# Patient Record
Sex: Female | Born: 1935 | Race: White | Hispanic: No | Smoking: Former smoker
Health system: Southern US, Community
[De-identification: ages and names within clinical notes are randomized; demographics above are authoritative.]

## PROBLEM LIST (undated history)

## (undated) DIAGNOSIS — I10 Essential (primary) hypertension: Secondary | ICD-10-CM

## (undated) DIAGNOSIS — K559 Vascular disorder of intestine, unspecified: Secondary | ICD-10-CM

## (undated) DIAGNOSIS — F419 Anxiety disorder, unspecified: Secondary | ICD-10-CM

## (undated) DIAGNOSIS — Z9889 Other specified postprocedural states: Secondary | ICD-10-CM

## (undated) DIAGNOSIS — F329 Major depressive disorder, single episode, unspecified: Secondary | ICD-10-CM

## (undated) DIAGNOSIS — I729 Aneurysm of unspecified site: Secondary | ICD-10-CM

## (undated) DIAGNOSIS — E7800 Pure hypercholesterolemia, unspecified: Secondary | ICD-10-CM

## (undated) DIAGNOSIS — I2510 Atherosclerotic heart disease of native coronary artery without angina pectoris: Secondary | ICD-10-CM

## (undated) DIAGNOSIS — C50919 Malignant neoplasm of unspecified site of unspecified female breast: Secondary | ICD-10-CM

## (undated) DIAGNOSIS — F32A Depression, unspecified: Secondary | ICD-10-CM

## (undated) DIAGNOSIS — R112 Nausea with vomiting, unspecified: Secondary | ICD-10-CM

## (undated) DIAGNOSIS — K838 Other specified diseases of biliary tract: Secondary | ICD-10-CM

## (undated) DIAGNOSIS — J449 Chronic obstructive pulmonary disease, unspecified: Secondary | ICD-10-CM

## (undated) HISTORY — PX: ABDOMINAL HYSTERECTOMY: SHX81

## (undated) HISTORY — PX: CHOLECYSTECTOMY: SHX55

## (undated) HISTORY — PX: CORONARY ANGIOPLASTY WITH STENT PLACEMENT: SHX49

## (undated) HISTORY — PX: BREAST SURGERY: SHX581

## (undated) HISTORY — PX: HERNIA REPAIR: SHX51

## (undated) HISTORY — PX: APPENDECTOMY: SHX54

---

## 1997-10-06 ENCOUNTER — Inpatient Hospital Stay (HOSPITAL_COMMUNITY): Admission: EM | Admit: 1997-10-06 | Discharge: 1997-10-10 | Payer: Self-pay | Admitting: Cardiovascular Disease

## 1997-10-09 ENCOUNTER — Encounter: Payer: Self-pay | Admitting: Cardiovascular Disease

## 1998-02-14 ENCOUNTER — Inpatient Hospital Stay (HOSPITAL_COMMUNITY): Admission: AD | Admit: 1998-02-14 | Discharge: 1998-02-16 | Payer: Self-pay | Admitting: Cardiovascular Disease

## 1999-09-02 ENCOUNTER — Inpatient Hospital Stay (HOSPITAL_COMMUNITY): Admission: AD | Admit: 1999-09-02 | Discharge: 1999-09-04 | Payer: Self-pay | Admitting: Cardiology

## 2000-06-02 ENCOUNTER — Ambulatory Visit (HOSPITAL_COMMUNITY): Admission: RE | Admit: 2000-06-02 | Discharge: 2000-06-02 | Payer: Self-pay | Admitting: Family Medicine

## 2000-06-02 ENCOUNTER — Encounter: Payer: Self-pay | Admitting: Family Medicine

## 2001-01-12 DIAGNOSIS — C50919 Malignant neoplasm of unspecified site of unspecified female breast: Secondary | ICD-10-CM

## 2001-01-12 HISTORY — DX: Malignant neoplasm of unspecified site of unspecified female breast: C50.919

## 2001-04-05 ENCOUNTER — Ambulatory Visit (HOSPITAL_COMMUNITY): Admission: RE | Admit: 2001-04-05 | Discharge: 2001-04-05 | Payer: Self-pay | Admitting: Family Medicine

## 2001-04-05 ENCOUNTER — Encounter: Payer: Self-pay | Admitting: Family Medicine

## 2001-04-06 ENCOUNTER — Encounter: Payer: Self-pay | Admitting: Family Medicine

## 2001-04-06 ENCOUNTER — Ambulatory Visit (HOSPITAL_COMMUNITY): Admission: RE | Admit: 2001-04-06 | Discharge: 2001-04-06 | Payer: Self-pay | Admitting: Family Medicine

## 2001-04-13 ENCOUNTER — Encounter: Payer: Self-pay | Admitting: General Surgery

## 2001-04-13 ENCOUNTER — Ambulatory Visit (HOSPITAL_COMMUNITY): Admission: RE | Admit: 2001-04-13 | Discharge: 2001-04-13 | Payer: Self-pay | Admitting: General Surgery

## 2001-04-20 ENCOUNTER — Ambulatory Visit (HOSPITAL_COMMUNITY): Admission: RE | Admit: 2001-04-20 | Discharge: 2001-04-20 | Payer: Self-pay | Admitting: General Surgery

## 2001-04-20 ENCOUNTER — Encounter: Payer: Self-pay | Admitting: General Surgery

## 2001-04-25 ENCOUNTER — Encounter: Admission: RE | Admit: 2001-04-25 | Discharge: 2001-04-25 | Payer: Self-pay | Admitting: Oncology

## 2001-04-25 ENCOUNTER — Encounter (HOSPITAL_COMMUNITY): Admission: RE | Admit: 2001-04-25 | Discharge: 2001-05-25 | Payer: Self-pay | Admitting: Oncology

## 2001-04-28 ENCOUNTER — Encounter (HOSPITAL_COMMUNITY): Payer: Self-pay | Admitting: Oncology

## 2001-06-24 ENCOUNTER — Encounter (HOSPITAL_COMMUNITY): Admission: RE | Admit: 2001-06-24 | Discharge: 2001-07-24 | Payer: Self-pay | Admitting: Oncology

## 2001-06-24 ENCOUNTER — Encounter: Admission: RE | Admit: 2001-06-24 | Discharge: 2001-06-24 | Payer: Self-pay | Admitting: Oncology

## 2001-09-19 ENCOUNTER — Other Ambulatory Visit: Admission: RE | Admit: 2001-09-19 | Discharge: 2001-09-19 | Payer: Self-pay | Admitting: Internal Medicine

## 2001-09-19 ENCOUNTER — Encounter (HOSPITAL_COMMUNITY): Admission: RE | Admit: 2001-09-19 | Discharge: 2001-10-19 | Payer: Self-pay | Admitting: Oncology

## 2001-09-19 ENCOUNTER — Encounter: Admission: RE | Admit: 2001-09-19 | Discharge: 2001-09-19 | Payer: Self-pay | Admitting: Oncology

## 2001-11-07 ENCOUNTER — Ambulatory Visit (HOSPITAL_COMMUNITY): Admission: RE | Admit: 2001-11-07 | Discharge: 2001-11-07 | Payer: Self-pay | Admitting: Family Medicine

## 2001-11-07 ENCOUNTER — Encounter: Payer: Self-pay | Admitting: Family Medicine

## 2001-12-28 ENCOUNTER — Encounter: Admission: RE | Admit: 2001-12-28 | Discharge: 2001-12-28 | Payer: Self-pay | Admitting: Oncology

## 2001-12-28 ENCOUNTER — Encounter (HOSPITAL_COMMUNITY): Admission: RE | Admit: 2001-12-28 | Discharge: 2002-01-27 | Payer: Self-pay | Admitting: Oncology

## 2002-01-15 ENCOUNTER — Emergency Department (HOSPITAL_COMMUNITY): Admission: EM | Admit: 2002-01-15 | Discharge: 2002-01-15 | Payer: Self-pay | Admitting: Emergency Medicine

## 2002-03-09 ENCOUNTER — Emergency Department (HOSPITAL_COMMUNITY): Admission: EM | Admit: 2002-03-09 | Discharge: 2002-03-09 | Payer: Self-pay | Admitting: Emergency Medicine

## 2002-03-11 ENCOUNTER — Encounter: Payer: Self-pay | Admitting: Internal Medicine

## 2002-03-11 ENCOUNTER — Inpatient Hospital Stay (HOSPITAL_COMMUNITY): Admission: EM | Admit: 2002-03-11 | Discharge: 2002-03-15 | Payer: Self-pay | Admitting: Internal Medicine

## 2002-04-11 ENCOUNTER — Encounter (HOSPITAL_COMMUNITY): Admission: RE | Admit: 2002-04-11 | Discharge: 2002-05-11 | Payer: Self-pay | Admitting: Oncology

## 2002-04-11 ENCOUNTER — Encounter: Admission: RE | Admit: 2002-04-11 | Discharge: 2002-04-11 | Payer: Self-pay | Admitting: Oncology

## 2002-04-11 ENCOUNTER — Encounter (HOSPITAL_COMMUNITY): Payer: Self-pay | Admitting: Oncology

## 2002-05-11 ENCOUNTER — Encounter (HOSPITAL_COMMUNITY): Admission: RE | Admit: 2002-05-11 | Discharge: 2002-06-10 | Payer: Self-pay | Admitting: Oncology

## 2002-05-11 ENCOUNTER — Encounter: Admission: RE | Admit: 2002-05-11 | Discharge: 2002-05-11 | Payer: Self-pay

## 2002-06-04 ENCOUNTER — Inpatient Hospital Stay (HOSPITAL_COMMUNITY): Admission: EM | Admit: 2002-06-04 | Discharge: 2002-06-05 | Payer: Self-pay | Admitting: Emergency Medicine

## 2002-06-04 ENCOUNTER — Encounter: Payer: Self-pay | Admitting: Emergency Medicine

## 2002-06-12 ENCOUNTER — Encounter: Admission: RE | Admit: 2002-06-12 | Discharge: 2002-06-12 | Payer: Self-pay | Admitting: Oncology

## 2002-08-22 ENCOUNTER — Ambulatory Visit (HOSPITAL_COMMUNITY): Admission: RE | Admit: 2002-08-22 | Discharge: 2002-08-22 | Payer: Self-pay | Admitting: Family Medicine

## 2002-08-22 ENCOUNTER — Encounter: Payer: Self-pay | Admitting: Family Medicine

## 2002-08-28 ENCOUNTER — Encounter: Admission: RE | Admit: 2002-08-28 | Discharge: 2002-08-28 | Payer: Self-pay | Admitting: Oncology

## 2002-08-28 ENCOUNTER — Encounter (HOSPITAL_COMMUNITY): Admission: RE | Admit: 2002-08-28 | Discharge: 2002-09-27 | Payer: Self-pay | Admitting: Oncology

## 2002-10-18 ENCOUNTER — Encounter: Admission: RE | Admit: 2002-10-18 | Discharge: 2002-10-18 | Payer: Self-pay | Admitting: Oncology

## 2002-10-18 ENCOUNTER — Encounter (HOSPITAL_COMMUNITY): Payer: Self-pay | Admitting: Oncology

## 2002-10-18 ENCOUNTER — Encounter (HOSPITAL_COMMUNITY): Admission: RE | Admit: 2002-10-18 | Discharge: 2002-11-17 | Payer: Self-pay | Admitting: Oncology

## 2002-12-04 ENCOUNTER — Encounter (HOSPITAL_COMMUNITY): Admission: RE | Admit: 2002-12-04 | Discharge: 2003-01-03 | Payer: Self-pay | Admitting: Oncology

## 2002-12-04 ENCOUNTER — Encounter: Admission: RE | Admit: 2002-12-04 | Discharge: 2002-12-04 | Payer: Self-pay | Admitting: Oncology

## 2002-12-19 ENCOUNTER — Ambulatory Visit (HOSPITAL_COMMUNITY): Admission: RE | Admit: 2002-12-19 | Discharge: 2002-12-19 | Payer: Self-pay | Admitting: Internal Medicine

## 2003-01-18 ENCOUNTER — Encounter: Admission: RE | Admit: 2003-01-18 | Discharge: 2003-01-18 | Payer: Self-pay | Admitting: Oncology

## 2003-02-03 ENCOUNTER — Emergency Department (HOSPITAL_COMMUNITY): Admission: EM | Admit: 2003-02-03 | Discharge: 2003-02-03 | Payer: Self-pay | Admitting: Emergency Medicine

## 2003-02-26 ENCOUNTER — Ambulatory Visit (HOSPITAL_COMMUNITY): Admission: RE | Admit: 2003-02-26 | Discharge: 2003-02-26 | Payer: Self-pay | Admitting: Family Medicine

## 2003-03-26 ENCOUNTER — Inpatient Hospital Stay (HOSPITAL_COMMUNITY): Admission: RE | Admit: 2003-03-26 | Discharge: 2003-03-28 | Payer: Self-pay | Admitting: Family Medicine

## 2003-04-02 ENCOUNTER — Encounter: Admission: RE | Admit: 2003-04-02 | Discharge: 2003-04-02 | Payer: Self-pay | Admitting: Oncology

## 2003-05-23 ENCOUNTER — Encounter (HOSPITAL_COMMUNITY): Admission: RE | Admit: 2003-05-23 | Discharge: 2003-06-22 | Payer: Self-pay | Admitting: Oncology

## 2003-09-13 ENCOUNTER — Other Ambulatory Visit: Admission: RE | Admit: 2003-09-13 | Discharge: 2003-09-13 | Payer: Self-pay | Admitting: Internal Medicine

## 2003-09-27 ENCOUNTER — Other Ambulatory Visit: Admission: RE | Admit: 2003-09-27 | Discharge: 2003-09-27 | Payer: Self-pay | Admitting: Internal Medicine

## 2003-10-03 ENCOUNTER — Encounter: Admission: RE | Admit: 2003-10-03 | Discharge: 2003-10-12 | Payer: Self-pay | Admitting: Oncology

## 2003-10-03 ENCOUNTER — Encounter (HOSPITAL_COMMUNITY): Admission: RE | Admit: 2003-10-03 | Discharge: 2003-10-12 | Payer: Self-pay | Admitting: Oncology

## 2003-10-15 ENCOUNTER — Encounter: Admission: RE | Admit: 2003-10-15 | Discharge: 2003-10-15 | Payer: Self-pay | Admitting: Oncology

## 2003-12-24 ENCOUNTER — Other Ambulatory Visit: Admission: RE | Admit: 2003-12-24 | Discharge: 2003-12-24 | Payer: Self-pay | Admitting: Dermatology

## 2004-02-21 ENCOUNTER — Encounter: Admission: RE | Admit: 2004-02-21 | Discharge: 2004-02-21 | Payer: Self-pay | Admitting: Oncology

## 2004-02-21 ENCOUNTER — Ambulatory Visit (HOSPITAL_COMMUNITY): Payer: Self-pay | Admitting: Oncology

## 2004-05-21 ENCOUNTER — Encounter (HOSPITAL_COMMUNITY): Admission: RE | Admit: 2004-05-21 | Discharge: 2004-06-20 | Payer: Self-pay | Admitting: Oncology

## 2004-05-21 ENCOUNTER — Encounter: Admission: RE | Admit: 2004-05-21 | Discharge: 2004-05-21 | Payer: Self-pay | Admitting: Oncology

## 2004-06-20 ENCOUNTER — Ambulatory Visit (HOSPITAL_COMMUNITY): Payer: Self-pay | Admitting: Oncology

## 2004-10-07 ENCOUNTER — Ambulatory Visit (HOSPITAL_COMMUNITY): Payer: Self-pay | Admitting: Oncology

## 2004-10-07 ENCOUNTER — Encounter: Admission: RE | Admit: 2004-10-07 | Discharge: 2004-10-10 | Payer: Self-pay | Admitting: Oncology

## 2004-10-24 ENCOUNTER — Encounter: Admission: RE | Admit: 2004-10-24 | Discharge: 2004-10-24 | Payer: Self-pay | Admitting: Oncology

## 2005-03-06 ENCOUNTER — Encounter: Admission: RE | Admit: 2005-03-06 | Discharge: 2005-03-06 | Payer: Self-pay | Admitting: Oncology

## 2005-03-06 ENCOUNTER — Ambulatory Visit (HOSPITAL_COMMUNITY): Payer: Self-pay | Admitting: Oncology

## 2005-05-27 ENCOUNTER — Encounter: Admission: RE | Admit: 2005-05-27 | Discharge: 2005-05-27 | Payer: Self-pay | Admitting: Oncology

## 2005-05-27 ENCOUNTER — Encounter (HOSPITAL_COMMUNITY): Admission: RE | Admit: 2005-05-27 | Discharge: 2005-06-26 | Payer: Self-pay | Admitting: Oncology

## 2005-07-03 ENCOUNTER — Encounter (HOSPITAL_COMMUNITY): Admission: RE | Admit: 2005-07-03 | Discharge: 2005-08-02 | Payer: Self-pay | Admitting: Oncology

## 2005-07-03 ENCOUNTER — Encounter: Admission: RE | Admit: 2005-07-03 | Discharge: 2005-07-03 | Payer: Self-pay | Admitting: Oncology

## 2005-07-06 ENCOUNTER — Ambulatory Visit (HOSPITAL_COMMUNITY): Payer: Self-pay | Admitting: Oncology

## 2005-10-30 ENCOUNTER — Encounter (HOSPITAL_COMMUNITY): Admission: RE | Admit: 2005-10-30 | Discharge: 2005-11-29 | Payer: Self-pay | Admitting: Oncology

## 2005-10-30 ENCOUNTER — Encounter: Admission: RE | Admit: 2005-10-30 | Discharge: 2005-10-30 | Payer: Self-pay | Admitting: Oncology

## 2005-10-30 ENCOUNTER — Ambulatory Visit (HOSPITAL_COMMUNITY): Payer: Self-pay | Admitting: Oncology

## 2006-03-11 ENCOUNTER — Encounter (HOSPITAL_COMMUNITY): Admission: RE | Admit: 2006-03-11 | Discharge: 2006-04-10 | Payer: Self-pay | Admitting: Oncology

## 2006-03-15 ENCOUNTER — Ambulatory Visit (HOSPITAL_COMMUNITY): Payer: Self-pay | Admitting: Oncology

## 2006-06-02 ENCOUNTER — Encounter (HOSPITAL_COMMUNITY): Admission: RE | Admit: 2006-06-02 | Discharge: 2006-07-02 | Payer: Self-pay | Admitting: Oncology

## 2006-07-07 ENCOUNTER — Encounter (HOSPITAL_COMMUNITY): Admission: RE | Admit: 2006-07-07 | Discharge: 2006-08-06 | Payer: Self-pay | Admitting: Oncology

## 2006-07-07 ENCOUNTER — Ambulatory Visit (HOSPITAL_COMMUNITY): Payer: Self-pay | Admitting: Oncology

## 2006-10-26 ENCOUNTER — Ambulatory Visit (HOSPITAL_COMMUNITY): Admission: RE | Admit: 2006-10-26 | Discharge: 2006-10-26 | Payer: Self-pay | Admitting: Ophthalmology

## 2006-11-03 ENCOUNTER — Ambulatory Visit (HOSPITAL_COMMUNITY): Payer: Self-pay | Admitting: Oncology

## 2006-11-09 ENCOUNTER — Ambulatory Visit (HOSPITAL_COMMUNITY): Admission: RE | Admit: 2006-11-09 | Discharge: 2006-11-09 | Payer: Self-pay | Admitting: Ophthalmology

## 2007-01-13 DIAGNOSIS — K559 Vascular disorder of intestine, unspecified: Secondary | ICD-10-CM

## 2007-01-13 HISTORY — DX: Vascular disorder of intestine, unspecified: K55.9

## 2007-03-02 ENCOUNTER — Encounter (HOSPITAL_COMMUNITY): Admission: RE | Admit: 2007-03-02 | Discharge: 2007-04-01 | Payer: Self-pay | Admitting: Oncology

## 2007-03-02 ENCOUNTER — Ambulatory Visit (HOSPITAL_COMMUNITY): Payer: Self-pay | Admitting: Oncology

## 2007-05-13 HISTORY — PX: COLONOSCOPY: SHX174

## 2007-05-13 HISTORY — PX: ESOPHAGOGASTRODUODENOSCOPY: SHX1529

## 2007-06-01 ENCOUNTER — Ambulatory Visit: Payer: Self-pay | Admitting: Gastroenterology

## 2007-06-01 ENCOUNTER — Observation Stay (HOSPITAL_COMMUNITY): Admission: EM | Admit: 2007-06-01 | Discharge: 2007-06-03 | Payer: Self-pay | Admitting: Emergency Medicine

## 2007-06-01 ENCOUNTER — Encounter: Payer: Self-pay | Admitting: Gastroenterology

## 2007-06-13 ENCOUNTER — Ambulatory Visit (HOSPITAL_COMMUNITY): Admission: RE | Admit: 2007-06-13 | Discharge: 2007-06-13 | Payer: Self-pay | Admitting: Pulmonary Disease

## 2007-06-28 ENCOUNTER — Ambulatory Visit (HOSPITAL_COMMUNITY): Payer: Self-pay | Admitting: Oncology

## 2007-06-28 ENCOUNTER — Encounter (HOSPITAL_COMMUNITY): Admission: RE | Admit: 2007-06-28 | Discharge: 2007-07-28 | Payer: Self-pay | Admitting: Oncology

## 2007-10-26 ENCOUNTER — Ambulatory Visit (HOSPITAL_COMMUNITY): Payer: Self-pay | Admitting: Oncology

## 2007-10-26 ENCOUNTER — Encounter (HOSPITAL_COMMUNITY): Admission: RE | Admit: 2007-10-26 | Discharge: 2007-11-25 | Payer: Self-pay | Admitting: Oncology

## 2008-03-14 ENCOUNTER — Ambulatory Visit (HOSPITAL_COMMUNITY): Payer: Self-pay | Admitting: Oncology

## 2008-03-14 ENCOUNTER — Encounter (HOSPITAL_COMMUNITY): Admission: RE | Admit: 2008-03-14 | Discharge: 2008-04-13 | Payer: Self-pay | Admitting: Oncology

## 2008-07-26 ENCOUNTER — Encounter (HOSPITAL_COMMUNITY): Admission: RE | Admit: 2008-07-26 | Discharge: 2008-08-25 | Payer: Self-pay | Admitting: Oncology

## 2008-07-26 ENCOUNTER — Ambulatory Visit (HOSPITAL_COMMUNITY): Payer: Self-pay | Admitting: Oncology

## 2008-07-29 ENCOUNTER — Inpatient Hospital Stay (HOSPITAL_COMMUNITY): Admission: EM | Admit: 2008-07-29 | Discharge: 2008-07-30 | Payer: Self-pay | Admitting: Emergency Medicine

## 2008-08-01 ENCOUNTER — Emergency Department (HOSPITAL_COMMUNITY): Admission: EM | Admit: 2008-08-01 | Discharge: 2008-08-01 | Payer: Self-pay | Admitting: Emergency Medicine

## 2009-04-10 ENCOUNTER — Ambulatory Visit (HOSPITAL_COMMUNITY): Admission: RE | Admit: 2009-04-10 | Discharge: 2009-04-10 | Payer: Self-pay | Admitting: Oncology

## 2009-05-08 ENCOUNTER — Ambulatory Visit (HOSPITAL_COMMUNITY): Payer: Self-pay | Admitting: Oncology

## 2009-10-07 ENCOUNTER — Emergency Department (HOSPITAL_COMMUNITY): Admission: EM | Admit: 2009-10-07 | Discharge: 2009-10-07 | Payer: Self-pay | Admitting: Emergency Medicine

## 2009-11-06 ENCOUNTER — Ambulatory Visit (HOSPITAL_COMMUNITY): Admission: RE | Admit: 2009-11-06 | Discharge: 2009-11-06 | Payer: Self-pay | Admitting: Oncology

## 2010-02-01 ENCOUNTER — Encounter (HOSPITAL_COMMUNITY): Payer: Self-pay | Admitting: Oncology

## 2010-03-27 LAB — CBC
HCT: 42.7 % (ref 36.0–46.0)
Hemoglobin: 14.8 g/dL (ref 12.0–15.0)
MCH: 33.7 pg (ref 26.0–34.0)
MCHC: 34.7 g/dL (ref 30.0–36.0)
RDW: 13.3 % (ref 11.5–15.5)

## 2010-03-27 LAB — DIFFERENTIAL
Basophils Absolute: 0.1 10*3/uL (ref 0.0–0.1)
Basophils Relative: 1 % (ref 0–1)
Eosinophils Absolute: 0.5 10*3/uL (ref 0.0–0.7)
Monocytes Absolute: 0.9 10*3/uL (ref 0.1–1.0)
Monocytes Relative: 10 % (ref 3–12)
Neutro Abs: 5.4 10*3/uL (ref 1.7–7.7)

## 2010-03-27 LAB — BASIC METABOLIC PANEL
BUN: 10 mg/dL (ref 6–23)
CO2: 29 mEq/L (ref 19–32)
Calcium: 9.4 mg/dL (ref 8.4–10.5)
Chloride: 106 mEq/L (ref 96–112)
Creatinine, Ser: 0.65 mg/dL (ref 0.4–1.2)
GFR calc Af Amer: >60 mL/min (ref >60)
GFR calc non Af Amer: >60 mL/min (ref >60)
Glucose, Bld: 129 mg/dL — ABNORMAL HIGH (ref 70–99)
Potassium: 3.9 mEq/L (ref 3.5–5.1)
Sodium: 139 mEq/L (ref 135–145)

## 2010-03-27 LAB — POCT CARDIAC MARKERS: CKMB, poc: <1 ng/mL — ABNORMAL LOW (ref 1.0–8.0)

## 2010-04-20 LAB — URINALYSIS, ROUTINE W REFLEX MICROSCOPIC
Bilirubin Urine: NEGATIVE
Glucose, UA: NEGATIVE mg/dL
Ketones, ur: NEGATIVE mg/dL
Leukocytes, UA: NEGATIVE
Nitrite: NEGATIVE
Protein, ur: NEGATIVE mg/dL
Specific Gravity, Urine: 1.02 (ref 1.005–1.030)
Urobilinogen, UA: 0.2 mg/dL (ref 0.0–1.0)
pH: 6.5 (ref 5.0–8.0)

## 2010-04-20 LAB — BASIC METABOLIC PANEL WITH GFR
GFR calc Af Amer: >60 mL/min (ref >60)
GFR calc non Af Amer: >60 mL/min (ref >60)
Potassium: 3.4 meq/L — ABNORMAL LOW (ref 3.5–5.1)
Sodium: 135 meq/L (ref 135–145)

## 2010-04-20 LAB — BASIC METABOLIC PANEL
BUN: 11 mg/dL (ref 6–23)
CO2: 29 mEq/L (ref 19–32)
Calcium: 9.2 mg/dL (ref 8.4–10.5)
Chloride: 102 mEq/L (ref 96–112)
Creatinine, Ser: 0.67 mg/dL (ref 0.4–1.2)
Glucose, Bld: 121 mg/dL — ABNORMAL HIGH (ref 70–99)

## 2010-04-20 LAB — HEPATIC FUNCTION PANEL
ALT: 26 U/L (ref 0–35)
AST: 45 U/L — ABNORMAL HIGH (ref 0–37)
Albumin: 3.9 g/dL (ref 3.5–5.2)
Alkaline Phosphatase: 65 U/L (ref 39–117)
Bilirubin, Direct: 0.2 mg/dL (ref 0.0–0.3)
Indirect Bilirubin: 0.6 mg/dL (ref 0.3–0.9)
Total Bilirubin: 0.8 mg/dL (ref 0.3–1.2)
Total Protein: 6.6 g/dL (ref 6.0–8.3)

## 2010-04-20 LAB — COMPREHENSIVE METABOLIC PANEL
ALT: 174 U/L — ABNORMAL HIGH (ref 0–35)
ALT: 13 U/L (ref 0–35)
AST: 19 U/L (ref 0–37)
Albumin: 3.1 g/dL — ABNORMAL LOW (ref 3.5–5.2)
Alkaline Phosphatase: 72 U/L (ref 39–117)
BUN: 13 mg/dL (ref 6–23)
CO2: 28 mEq/L (ref 19–32)
Calcium: 8 mg/dL — ABNORMAL LOW (ref 8.4–10.5)
Calcium: 9.2 mg/dL (ref 8.4–10.5)
Creatinine, Ser: 0.57 mg/dL (ref 0.4–1.2)
GFR calc Af Amer: >60 mL/min (ref >60)
GFR calc non Af Amer: >60 mL/min (ref >60)
Glucose, Bld: 108 mg/dL — ABNORMAL HIGH (ref 70–99)
Potassium: 3.8 mEq/L (ref 3.5–5.1)
Sodium: 142 mEq/L (ref 135–145)
Sodium: 137 mEq/L (ref 135–145)
Total Bilirubin: 0.9 mg/dL (ref 0.3–1.2)
Total Bilirubin: 0.9 mg/dL (ref 0.3–1.2)
Total Protein: 5.8 g/dL — ABNORMAL LOW (ref 6.0–8.3)
Total Protein: 6.4 g/dL (ref 6.0–8.3)

## 2010-04-20 LAB — DIFFERENTIAL
Band Neutrophils: 17 % — ABNORMAL HIGH (ref 0–10)
Basophils Absolute: 0 10*3/uL (ref 0.0–0.1)
Basophils Relative: 0 % (ref 0–1)
Basophils Relative: 0 % (ref 0–1)
Basophils Relative: 0 % (ref 0–1)
Blasts: 0 %
Eosinophils Absolute: 0.1 10*3/uL (ref 0.0–0.7)
Eosinophils Absolute: 0.2 10*3/uL (ref 0.0–0.7)
Eosinophils Relative: 0 % (ref 0–5)
Eosinophils Relative: 2 % (ref 0–5)
Lymphocytes Relative: 2 % — ABNORMAL LOW (ref 12–46)
Lymphocytes Relative: 5 % — ABNORMAL LOW (ref 12–46)
Lymphs Abs: 0.4 10*3/uL — ABNORMAL LOW (ref 0.7–4.0)
Lymphs Abs: 0.4 K/uL — ABNORMAL LOW (ref 0.7–4.0)
Monocytes Absolute: 0 K/uL — ABNORMAL LOW (ref 0.1–1.0)
Monocytes Absolute: 0.7 10*3/uL (ref 0.1–1.0)
Monocytes Relative: 0 % — ABNORMAL LOW (ref 3–12)
Monocytes Relative: 3 % (ref 3–12)
Neutro Abs: 8.6 10*3/uL — ABNORMAL HIGH (ref 1.7–7.7)
Neutrophils Relative %: 79 % — ABNORMAL HIGH (ref 43–77)
Neutrophils Relative %: 93 % — ABNORMAL HIGH (ref 43–77)

## 2010-04-20 LAB — CBC
HCT: 38.6 % (ref 36.0–46.0)
HCT: 44.4 % (ref 36.0–46.0)
Hemoglobin: 12.9 g/dL (ref 12.0–15.0)
Hemoglobin: 15.4 g/dL — ABNORMAL HIGH (ref 12.0–15.0)
MCHC: 34.5 g/dL (ref 30.0–36.0)
MCHC: 34.6 g/dL (ref 30.0–36.0)
MCHC: 35 g/dL (ref 30.0–36.0)
MCV: 96.4 fL (ref 78.0–100.0)
MCV: 96.5 fL (ref 78.0–100.0)
Platelets: 106 10*3/uL — ABNORMAL LOW (ref 150–400)
Platelets: 107 10*3/uL — ABNORMAL LOW (ref 150–400)
RBC: 3.83 MIL/uL — ABNORMAL LOW (ref 3.87–5.11)
RBC: 4.6 MIL/uL (ref 3.87–5.11)
RDW: 13.1 % (ref 11.5–15.5)
RDW: 13.4 % (ref 11.5–15.5)
WBC: 9.2 K/uL (ref 4.0–10.5)

## 2010-04-20 LAB — BLOOD GAS, ARTERIAL
Acid-base deficit: 0.7 mmol/L (ref 0.0–2.0)
Bicarbonate: 25.2 mEq/L — ABNORMAL HIGH (ref 20.0–24.0)
O2 Content: 2 L/min
O2 Saturation: 74.2 %
Patient temperature: 98.6
pCO2 arterial: 55.6 mmHg — ABNORMAL HIGH (ref 35.0–45.0)
pH, Arterial: 7.279 — ABNORMAL LOW (ref 7.350–7.400)
pO2, Arterial: 43.5 mmHg — ABNORMAL LOW (ref 80.0–100.0)

## 2010-04-20 LAB — BRAIN NATRIURETIC PEPTIDE: Pro B Natriuretic peptide (BNP): 38.8 pg/mL (ref 0.0–100.0)

## 2010-04-20 LAB — URINE MICROSCOPIC-ADD ON

## 2010-04-20 LAB — CULTURE, BLOOD (ROUTINE X 2)
Culture: NO GROWTH
Report Status: 7232010

## 2010-04-20 LAB — LIPASE, BLOOD: Lipase: 29 U/L (ref 11–59)

## 2010-04-20 LAB — HEMOGLOBIN A1C: Hgb A1c MFr Bld: 5.6 % (ref 4.6–6.1)

## 2010-04-20 LAB — CARDIAC PANEL(CRET KIN+CKTOT+MB+TROPI): Troponin I: 0.06 ng/mL (ref 0.00–0.06)

## 2010-04-24 LAB — COMPREHENSIVE METABOLIC PANEL
Alkaline Phosphatase: 61 U/L (ref 39–117)
BUN: 10 mg/dL (ref 6–23)
CO2: 29 mEq/L (ref 19–32)
GFR calc non Af Amer: >60 mL/min (ref >60)
Glucose, Bld: 108 mg/dL — ABNORMAL HIGH (ref 70–99)
Potassium: 4 mEq/L (ref 3.5–5.1)
Total Protein: 6.2 g/dL (ref 6.0–8.3)

## 2010-04-24 LAB — DIFFERENTIAL
Basophils Absolute: 0 10*3/uL (ref 0.0–0.1)
Basophils Relative: 0 % (ref 0–1)
Monocytes Absolute: 0.6 10*3/uL (ref 0.1–1.0)
Neutro Abs: 4.1 10*3/uL (ref 1.7–7.7)
Neutrophils Relative %: 52 % (ref 43–77)

## 2010-04-24 LAB — CBC
HCT: 45.7 % (ref 36.0–46.0)
Hemoglobin: 15.5 g/dL — ABNORMAL HIGH (ref 12.0–15.0)
MCHC: 34 g/dL (ref 30.0–36.0)
RBC: 4.74 MIL/uL (ref 3.87–5.11)
RDW: 13.1 % (ref 11.5–15.5)

## 2010-04-24 LAB — CANCER ANTIGEN 27.29: CA 27.29: 14 U/mL (ref 0–39)

## 2010-05-07 ENCOUNTER — Ambulatory Visit (HOSPITAL_COMMUNITY): Payer: Self-pay | Admitting: Oncology

## 2010-05-27 NOTE — H&P (Signed)
[name], [name]                  ACCOUNT NO.:  [id number]   MEDICAL RECORD NO.:  [id number]          PATIENT TYPE:  INP   LOCATION:  IC09                          FACILITY:  [facility]   PHYSICIAN:  Arne Cleveland, MD       DATE OF BIRTH:  1925-01-30   DATE OF ADMISSION:  07/28/2008  DATE OF DISCHARGE:  LH                              HISTORY & PHYSICAL   The patient is being admitted to [facility] at  [facility].   CHIEF COMPLAINT:  The patient began having low back pain and pain across  the lower abdomen following IV medication for her osteoporosis called  pamidronate.   HISTORY OF PRESENT ILLNESS:  [name] is an 77 year old Caucasian  female who on Friday received IV treatment of pamidronate.  Following  the treatment, she began having low back and lower quadrant abdominal  pain and felt a little nauseated, which was not unusual.  These were  symptoms that she normally had following the treatment, but today on the  day of admission, symptoms became much worse.  She had severe pain.  The  pain was described as crampy in the lower quadrants of the abdomen and  in her back.  She described the pain a 10/10, extreme pain, associated  with that was recurrent and a sort of hyperemesis and nausea.  She was  feeling very bad and was brought by her family to the emergency room.  She states that her vomiting was just liquid and food material that she  had eaten.  There was no blood in her stool.  She denied any diarrhea.  Once she started having the nausea and vomiting, she also noted that she  was having some shortness of breath.  Her pain was persistent and was  not relieved until she received IV Dilaudid in the emergency room and  she also received Zofran, the symptoms improved.  During the workup, the  ER physician noted that she was hypoxic and because of her severe pain,  a CT of the abdomen was obtained.  The CT showed that there was no acute  process in  the abdomen, but she did have left lower lobe infiltrate,  which was not seen in the abdominal series x-ray and the patient is  being admitted with left lower lobe pneumonia.   PAST MEDICAL HISTORY:  Significant for coronary artery disease and has  had stents in 1995 and 2001.  She also has a history of chronic  obstructive pulmonary disease and asthma.  Has a history of breast  cancer status post lumpectomy and lymph node dissection and she had no  evidence of metastasis, but received chemotherapy for 5 years and said  she is cancer free.  She did not receive any radiation treatment.  She  also has a history of hiatal hernia and gastroesophageal reflux,  hyperlipidemia, and hypertension.   PAST SURGICAL HISTORY:  She has had endoscopy and colonoscopy in April  2009.  At that time, they did not find any ulcers or polyps or other  abnormalities according to the patient.  She denied any diagnosis of  diverticulosis or diverticulitis.  The patient has had a cholecystectomy  in the past, a total abdominal hysterectomy and salpingo-oophorectomy,  left breast lumpectomy and lymph node dissection, and cardiac artery  stenting x2.   ALLERGIES:  She has allergies to CEFTIN; when she takes that she  develops swelling in her throat.   MEDICINES:  She is on:  1. Atenolol 20 mg daily.  2. Protonix 40 mg daily.  3. Zocor 40 mg daily.  4. Paxil, she does not know the dose for that.  5. Over-the-counter Tylenol as needed.   SOCIAL HISTORY:  The patient is widowed, has good family support.  She  is a retired Chief Strategy Officer.  She used to work in the [employer].  Tobacco:  She smoked for more than  40 years, now is only smoking occasionally and is trying to quit.  She  used to smoke half a pack per day for approximately 40 years.  She  denies any alcohol or drug abuse.   FAMILY HISTORY:  Her father died of natural causes in his 70s.  Her  mother died at  32 with complications from a surgery.  She has four  brothers, one died of an MI at 62, one died of old age at 18, one died  of cirrhosis from alcohol abuse.  She has one sister who died of  complications from a head injury, she was in her late 60s-70s.  She has  one brother who is 57 and he has coronary artery disease and arthritis.   REVIEW OF SYSTEMS:  Basically, she states that she was doing well until  after she received the treatment with pamidronate on Friday.  All  systems were reviewed.   LABORATORY DATA:  Her white count is 9200, hemoglobin 15.4, hematocrit  44%.  Her neutrophils are 93 with a left shift.  Lymphocytes are low.  Her chemistry panel:  Sodium is normal at 135 mEq/L, potassium 3.4  mEq/L, chloride 102 mEq/L, and CO2 is 29 mEq/L.  Glucose 121 mg/dL.  BUN  11, creatinine 0.67, and calcium 9.3 mg/dL.  Her liver profile:  Albumin  is normal.  AST is elevated at 45 IU/L.  ALT is normal.  Alkaline  phosphatase is normal.  Bilirubin is normal.  Lipase is normal.  Her  urinalysis shows negative glucose, trace blood, negative ketones,  negative protein, negative nitrites, negative leukocytes.  Microscopic:  She has 3-6 wbc's per high-powered field, 3-6 rbc's per high-powered  field.  Her blood gas on 2 liters of oxygen shows a pH of 7.27, PCO2 of  55.6, PO2 of 43.5, bicarb of 25.2.  I think this blood gas was done on  room air was 74.2.  O2 sat after being put on 2 liters was 91%.  BNP was  33.8 within normal range.  Chest x-ray showed questionable early CHF,  but the CT of the abdomen showed no acute intraabdominal findings.  Chronic findings as above, right greater than left lower lobe airspace  consolidation.  Correlate clinically for any evidence of pneumonia.  CT  of the pelvis showed no acute process.  The report on the CT of the  abdomen showed bilateral scarring and subpleural blebs, again noted more  focal right greater than left consolidation.  Atheromatous  aortic  calcification and hypodense plaques are present with fusiform infrarenal  aortic ectasia.  Multiple nonobstructing  right renal calculi again  noted, too small to characterize.  Bilateral renal cortical  hypodensities are noted.  No hydronephrosis.  Adrenal gland, spleen,  pancreas, liver are unremarkable.  Mild central biliary prominence is  noted status post cholecystectomy.  So, no acute intraabdominal findings  and right greater than left airspace consolidation, although clinically  the left sounds greater than the right.   PHYSICAL EXAMINATION:  GENERAL:  She is an 77 year old Caucasian female  well-developed, well-nourished who appears to be in mild-to-moderate  distress secondary to her respiration and low blood pressure.  VITAL SIGNS:  80/42, pulse 95, respirations 22, temperature 98.8.  Initially in the emergency room, her blood pressure was 158/59 and  109/45 following treatment with the Dilaudid.  Pulse initially was 115,  respirations 22.  The patient is definitely more comfortable than when  she initially came in, but still her vital are unstable.  HEENT:  Her head is atraumatic and normocephalic.  Eyes:  Pupils equal,  round and reactive to light.  Discs are sharp.  Extraocular muscle range  of motion full.  NECK:  Supple without jugular venous distention, thyromegaly or thyroid  mass.  No bruits are appreciated.  CHEST:  Normal to inspection and palpation.  LUNGS:  She has got rales, left greater than right.  HEART:  Regular rhythm and rate.  Normal S1 and S2 without murmur,  gallop, or rub.  ABDOMEN:  Soft and nontender with normoactive bowel sounds with no  hepatomegaly, no splenomegaly, no palpable mass.  RECTAL:  No masses in the rectal vault.  Stool is normal color.  Hemoccult negative.  EXTREMITIES:  There is no clubbing, cyanosis, or edema.  NEUROLOGIC:  She is awake, alert, and oriented x3.  PSYCHIATRIC:  Cranial nerves II through XII are intact.  Motor  strength  and sensory exam normal.  Reflex is 0-1 throughout.  SKIN:  No unusual rash or lesions are noted.   IMPRESSION:  Left lower lobe pneumonia, respiratory distress with  hypoxia, hypotension, nausea and vomiting post treatment with IV  pamidronate, and the low back pain and lower quadrant abdominal pain  post treatment with pamidronate.   PLAN:  To admit the patient to the ICU, have her get a fluid bolus of  normal saline now.  Albuterol and Atrovent neb treatment now.  Monitor  her respiratory status, her circulatory blood pressure status closely.  Start her on IV Avelox.  Obtain blood and urine cultures.  Treat her  nausea and vomiting and pain as required.  Further evaluation and  treatment as indicated by hospital course.   Her primary care physician is Dr. Juanetta Gosling.      Arne Cleveland, MD  Electronically Signed     ML/MEDQ  D:  07/29/2008  T:  07/29/2008  Job:  [id number]   cc:   Dr. Juanetta Gosling

## 2010-05-27 NOTE — Discharge Summary (Signed)
[name]                  ACCOUNT NO.:  [id number]   MEDICAL RECORD NO.:  [id number]          PATIENT TYPE:  INP   LOCATION:  A319                          FACILITY:  [facility]   PHYSICIAN:  Tesfaye D. Felecia Shelling, MD   DATE OF BIRTH:  Feb 10, 1925   DATE OF ADMISSION:  06/01/2007  DATE OF DISCHARGE:  05/22/2009LH                               DISCHARGE SUMMARY   DISCHARGE DIAGNOSES:  1. Ischemic colitis.  2. Dilated common bile duct, etiology unclear.  3. 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  4. Gastroesophageal reflux disease.  5. Bronchial asthma.  6. Hyperlipidemia.  7. History of cancer of the breast, status post partial mastectomy.  8. Depression disorder.  9. History of coronary artery disease, status post multiple cardiac      catheterizations.  10.Status post cholecystectomy.   DISCHARGE MEDICATIONS:  1. Atenolol 40 mg p.o. daily.  2. Protonix 40 mg daily.  3. Zocor 80 mg daily.  4. Paxil 10 mg daily.  5. Albuterol inhaler p.r.n.  6. Vitamin D daily.   DISPOSITION:  The patient will be discharged to home in stable  condition.   HOSPITAL COURSE:  This is a 77 year old female patient with history of  multiple medical illnesses, who was admitted on Jun 01, 2007, due to  acute onset of crampy upper abdominal pain associated with rectal  bleeding.  She was evaluated in the emergency room where CT scan of the  abdomen was done.  The CT scan showed a sign of ischemia and dilated  common bile duct.  The patient also had a mass on her distal esophagus.  The patient was admitted and her hemoglobin and hematocrit was  monitored.  The patient then followed by GI, who assisted in her  management.  She underwent colonoscopy and EGD.  Her colonoscopy finding  was consistent with ischemic colitis.  The patient otherwise was found  to have almost normal findings.  Over the hospital stay, the patient  improved.  She is planned for endoscopic [facility] to [facility] her [facility].  GI is going to make  arrangements for the procedure.  The patient will be discharged to home  in stable condition.  Her aspirin is on hold at this time and it was  recommended to start her back on Jun 08, 2007, according to GI  recommendation.  The patient will be followed in 1 week with Dr. Juanetta Gosling  and in about 2 weeks with GI.      Tesfaye D. Felecia Shelling, MD  Electronically Signed     TDF/MEDQ  D:  06/03/2007  T:  06/03/2007  Job:  [id number]

## 2010-05-27 NOTE — H&P (Signed)
[name]                  ACCOUNT NO.:  [id number]   MEDICAL RECORD NO.:  [id number]          PATIENT TYPE:  INP   LOCATION:  A319                          FACILITY:  [facility]   PHYSICIAN:  Edward L. Juanetta Gosling, M.D.DATE OF BIRTH:  06-24-1925   DATE OF ADMISSION:  05/31/2007  DATE OF DISCHARGE:  LH                              HISTORY & PHYSICAL   REASON FOR ADMISSION:  GI bleeding.   HISTORY OF PRESENT ILLNESS:  [name] is a 76 year old who came to the  emergency because of rectal bleeding.  She said that she had mid  abdominal cramping during the day.  She went to the bathroom.  She  eventually had a little bit of blood on the stool, but then she  continued having problems, had more pain, more cramping, and eventually  came to the emergency room after she had several bouts of passing bright  red blood with clots.  She has not had any other bleeding.  She has  never had an episode like this in the past.  She has not had any fever  or chills.  She has no history of any sort of abdominal problems thus  far she knows.   PAST MEDICAL HISTORY:  Otherwise, she has a history of asthma/COPD,  hiatal hernia, and hyperlipidemia.   PAST SURGICAL HISTORY:  Surgically, she has had a cholecystectomy,  hysterectomy, a breast biopsy, and she has had a cardiac stent.   SOCIAL HISTORY:  She does not drink any alcohol and does not use any  illicit drugs.  She does have a significant smoking history.   MEDICATIONS:  At home:  1. Atenolol 50 mg daily.  2. Protonix 40 mg daily.  3. Zocor 80 mg daily.  4. Paxil 10 mg daily.  5. Vitamin D, I am not sure of the dose.  6. Aspirin 81 mg daily.  7. Albuterol inhaler 2 puffs four times a day as needed.   ALLERGIES:  She has an allergy to CEFTIN.   FAMILY HISTORY:  Her mother died of cancer.   REVIEW OF SYSTEMS:  Except as mentioned is negative.   PHYSICAL EXAMINATION:  GENERAL:  She is a well-developed, well-nourished  female, who does not  appear to be in any acute distress.  VITAL SIGNS:  Her temperature is 98.3, pulse 87, respirations 20, blood  pressure 145/92, and O2 sats 98% on room air.  HEENT:  Her pupils are reactive.  Her nose and throat are clear.  Her  mucous membranes are slightly dry.  NECK:  Supple without masses.  HEART:  Regular without murmur, gallop, or rub.  ABDOMEN:  Soft, bowel sounds present, and she does not have any masses.   LABORATORY DATA:  White count 17,000, hemoglobin 15.9, platelets 144.  BMET, electrolytes are normal, glucose is 137, BUN of 15, creatinine  0.65.  Hemoglobin at 2:50 was 16.4 and at 8:02 was 15.8, so she has not  dropped her hemoglobin much.   ASSESSMENT:  She has abdominal discomfort with gastrointestinal bleeding  that is suggestive at least of an  ischemic colitis.  She has been on an  antibiotic fairly recently, so she would be at risk for Clostridium  difficile as well.  She has chronic obstructive pulmonary disease, which  is pretty stable.  She is actually doing well with that.  She has  hypertension, which is well controlled.  Hyperlipidemia, she is on Zocor  for that and I do not plan to change and at this point I am going to  plan to go ahead and get a gastrointestinal consultation and watch her  hemoglobin carefully.      Edward L. Juanetta Gosling, M.D.  Electronically Signed     ELH/MEDQ  D:  06/01/2007  T:  06/01/2007  Job:  [id number]

## 2010-05-27 NOTE — Op Note (Signed)
Jodi [name], [name]                  ACCOUNT NO.:  [id number]   MEDICAL RECORD NO.:  [id number]          PATIENT TYPE:  INP   LOCATION:  A319                          FACILITY:  [facility]   PHYSICIAN:  Kassie Mends, M.D.      DATE OF BIRTH:  23-Aug-1925   DATE OF PROCEDURE:  06/01/2007  DATE OF DISCHARGE:                               OPERATIVE REPORT   REFERRING PHYSICIAN:  Edward L. Juanetta Gosling, M.D.   PROCEDURES:  1. Colonoscopy with cold forceps biopsy.  2. Esophagogastroduodenoscopy with cold forceps biopsy.   INDICATION FOR EXAM:  Jodi [name] is a 77 year old female who presented  with abdominal pain and rectal bleeding.  She had a CT scan which showed  thickened colonic wall in the descending colon.  She also had a dilated  common bile duct and the radiologist recommended endoscopic evaluation  of her ampulla.  She was also noted to have an esophageal lipoma which  was diagnostic on CT scan.  She had no chest pain, palpitations or  shortness of breath associated with this episode of abdominal pain.  She  to continues to smoke intermittently.   FINDINGS:  1. Erythematous, ulcerated, edematous area between 35 cm and 40 cm      from the anal verge consistent with ischemic colitis.  No evidence      of necrosis.  Biopsies obtained via cold forceps to evaluate for      other etiologies such as inflammatory bowel disease, segmental      diverticulitis.  2. Frequent sigmoid colon diverticula.  Small internal hemorrhoids.      Otherwise no polyps, masses or AVMs seen.  3. Patent Schatzki ring.  Otherwise esophagus without Barrett's, mass      or erosion or ulceration.  4. A 2-3 cm hiatal hernia without evidence of Cameron erosions.      Streaky erythema and the antrum associated with occasional      superficial erosion.  Biopsies obtained via cold forceps to      evaluate for H. pylori gastritis.  5. Normal duodenal bulb.  Normal ampulla.  Normal second portion of      the duodenum.   DIAGNOSIS:  Jodi [name] likely had abdominal pain and rectal bleeding from  small-vessel disease leading to ischemic colitis.  Her CT scan was  reviewed with Dr. 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  She does have  calcification and plaques in her aorta.   RECOMMENDATIONS:  1. Recommend risk factor modification.  She should have adequate blood      pressure control and lipid management.  She should stop smoking      entirely.  2. Would hold aspirin for now.  May restart 81 mg daily on Jun 08, 2007.  3. Clear liquids and will advance to a low-fat diet as tolerated.  4. Will call Jodi [name] with the results of her biopsies.   MEDICATIONS:  1. Demerol 50 mg IV.  2. Versed 3 mg  IV.   PROCEDURE TECHNIQUE:  Physical exam was performed.  Informed consent was  obtained from the patient after explaining the benefits, risks and  alternatives to the procedure.  The patient was connected to the monitor  and placed in the left lateral position.  Continuous oxygen was provided  by nasal cannula and IV medicine administered through an indwelling  cannula.  After administration of sedation and rectal exam, the  patient's rectum was intubated and the scope was advanced under direct  visualization to the cecum.  The scope was removed slowly by carefully  examining the color, texture, anatomy and integrity of the mucosa on the  way out.   After the colonoscopy, the patient's esophagus was intubated and the  scope was advanced under direct visualization to the second portion of  the duodenum.  The scope was removed slowly by carefully examining the  color, texture, anatomy and integrity of the mucosa on the way out.  The  patient was recovered in endoscopy and discharged to the floor in  satisfactory condition.   PATH:  Ischemic colitis. recommend risk factor modification. Patient  needs to quit smoking. OPV w/ SLM in 3  months.      Kassie Mends, M.D.  Electronically Signed     SM/MEDQ  D:  06/01/2007  T:  06/01/2007  Job:  [id number]   cc:   Ramon Dredge L. Juanetta Gosling, M.D.  Fax: [phone]

## 2010-05-27 NOTE — Group Therapy Note (Signed)
NAMESHAYNE, DEERMAN                  ACCOUNT NO.:  [id number]   MEDICAL RECORD NO.:  [id number]          PATIENT TYPE:  INP   LOCATION:  A319                          FACILITY:  [facility]   PHYSICIAN:  Edward L. Juanetta Gosling, M.D.DATE OF BIRTH:  1925-08-22   DATE OF PROCEDURE:  DATE OF DISCHARGE:                                 PROGRESS NOTE   Ms. [name] continues to have some bleeding from her rectum, but her  hemoglobin level has stayed quite good.  I am not ready to let her go  home since she still got some bleeding.  I have discussed it with GI  team and they do agree, but she does not.  She is hemodynamically stable  and her hemoglobin level has been okay.  She has not had any chest pain.  She is not having any shortness of breath and overall things are better.   ASSESSMENT:  Then, she is improved.   PLAN:  To continue with her treatments and medications.  She is probably  going to be able to be discharged tomorrow.  I am going to discontinue  her IV fluids today.      Edward L. Juanetta Gosling, M.D.  Electronically Signed     ELH/MEDQ  D:  06/02/2007  T:  06/02/2007  Job:  [id number]

## 2010-05-27 NOTE — Consult Note (Signed)
[name], [name]                  ACCOUNT NO.:  [id number]   MEDICAL RECORD NO.:  [id number]          PATIENT TYPE:  INP   LOCATION:  A319                          FACILITY:  [facility]   PHYSICIAN:  [name], M.D.      DATE OF BIRTH:  Dec 15, 1925   DATE OF CONSULTATION:  06/01/2007  DATE OF DISCHARGE:                                 CONSULTATION   REASON FOR CONSULTATION:  Rectal bleeding.   HISTORY OF PRESENT ILLNESS:  The patient is a 77 year old Caucasian  female who presented with acute onset crampy abdominal pain associated  with hematochezia and blood clots.  She states that she was feeling well  yesterday.  Around 3:00 p.m., she had sudden onset crampy abdominal  pain.  She thought she had to have a bowel movement.  She passed a scant  amount of blood and no stool.  Several hours later, she had recurrence  of a crampy abdominal pain associated with passage of fresh blood per  rectum with blood clots.  She described it as a large amount on two  occasions.  She had two smaller amounts during the night and early a.m.  She decided to present to the emergency department last night due to  rectal bleeding.  She states this morning she is feeling some better.  She feels wiped out.  She has had some nausea but no vomiting.  Her  abdominal pain has improved dramatically.   On presentation, her hemoglobin was 15.9, this morning it is 15.8.  Her  white count was elevated at 17,000.  She has never had a prior  colonoscopy.  She did have CT upon presentation which revealed:  1. Pericolonic stranding and presumed colitis of the splenic flexure      and descending colon.  2. Biliary dilation status post cholecystectomy, ampullary stricture      or cold lesion cannot be excluded, questionable lipoma of the      distal esophagus.   HOME MEDICATIONS:  1. Atenolol 40 mg daily.  2. Protonix 40 mg daily.  3. Zocor 80 mg daily.  4. Paxil 10 mg daily.  5. Vitamin D daily.  6. Aspirin 81 mg  daily.  7. Albuterol inhaler p.r.n.   ALLERGIES:  CEFTIN.   PAST MEDICAL HISTORY:  1. Hyperlipidemia.  2. Asthma.  3. COPD.  4. GERD.  5. Hiatal hernia.  6. Depression.  7. History of left breast cancer diagnosed around 2003, underwent      lumpectomy initially followed by partial mastectomy.  8. Cholecystectomy in the 90's.  9. Hysterectomy.  10.History of CAD status post multiple cardiac catheterization.  She      states she has two stents.  11.No prior colonoscopy.  12.Remote EGD revealed hiatal hernia per patient.   FAMILY HISTORY:  Negative for colorectal cancer.  Daughter, mother and  sister all have breast cancer.   SOCIAL HISTORY:  She is widowed, she has three children.  She quit  smoking about 1-2 years ago.  No history of alcohol use.   REVIEW OF SYSTEMS:  HPI for  GI.  CONSTITUTIONAL:  No weight loss.  CARDIOPULMONARY:  Rarely uses her albuterol inhaler.  Denies any chest  pain.  GENITOURINARY:  No hematuria, dysuria.   PHYSICAL EXAMINATION:  VITAL SIGNS:  Temperature 98.3, pulse 89,  respirations 17, blood pressure 145/92, O2 saturation 98% on room air.  Height 65 inches, weight 69 kg.  GENERAL:  Pleasant, slightly obese, 77 elderly Caucasian female in no acute distress.  SKIN:  Warm and dry, no  jaundice. HEENT:  Oropharyngeal  moist and pink.  No lesions, erythema  or exudate.  No lymphadenopathy or thyromegaly.  CHEST:  Clear to  auscultation. CARDIAC:  Regular rate and rhythm.  Normal S1, S2.  No  murmurs, rubs or gallops. ABDOMEN:  Positive bowel sounds, slightly  obese but symmetrical.  Soft.  She has mild left sided tenderness to  deep palpation.  No rebound or guarding.  No abdominal bruits or  hernias.  No hepatosplenomegaly or masses.  EXTREMITIES:  No edema.   LABORATORY DATA:  As above.  In addition, platelets 144,000.  Sodium  137, potassium 4, BUN 15, creatinine 0.65, glucose 137.  CT findings as  outlined above.   IMPRESSION:  The patient is a  77 year old Caucasian female with:  1. Acute onset abdominal pain associated with bloody stools with left      sided colitis on CT.  Suspect ischemic colitis.  She has never had      a colonoscopy.  2. Incidental finding of biliary dilation on CT in the setting of      normal LFTs.  Clinical significance unclear at this time.  3. Possible lipoma of the distal esophagus based on CT findings.   RECOMMENDATIONS:  1. Colonoscopy and EGD today to evaluate distal esophagus.  2. Discontinue aspirin.  3. Will discontinue serial H&H's given stability so far demonstrated.  4. To discuss CT findings of biliary dilation with Dr. Cira Servant and      further recommendations to follow.      Tana Coast, P.A.      [name], M.D.  Electronically Signed    LL/MEDQ  D:  06/01/2007  T:  06/01/2007  Job:  [id number]   cc:   Ramon Dredge L. Juanetta Gosling, M.D.  Fax: [phone]

## 2010-05-30 NOTE — Discharge Summary (Signed)
NAME:  [name], KOERNER                            ACCOUNT NO.:  [id number]   MEDICAL RECORD NO.:  [id number]                   PATIENT TYPE:  INP   LOCATION:  A312                                 FACILITY:  [facility]   PHYSICIAN:  Mila Homer. Sudie Bailey, M.D.           DATE OF BIRTH:  01-04-1926   DATE OF ADMISSION:  03/11/2002  DATE OF DISCHARGE:  03/15/2002                                 DISCHARGE SUMMARY   HOSPITAL COURSE:  This 77 year old woman was admitted to the hospital  March 11, 2002 with COPD exacerbation. She had benign five day  hospitalization extending from March 11, 2002 to March 15, 2002.  Vital  signs remained stable.   Admission white cell count 17,100 with 74% neutrophils, 16 lymphs, 10 monos.  Her MET-7 showed a bicarbonate 35, glucose 134.  Rechecked the following day  sodium 134, bicarbonate 37, glucose 133.  Blood gases on room air showed a  pH 7.39, pCO2 45, pO2 62, O2 saturation 92%. Chest x-ray showed emphysema  and cardiomegaly with pulmonary vascular congestion.   Treatment in the ER included aminophylline and Solu-Medrol.  She was given  albuterol/Atrovent nebulizer treatment and then switched to just albuterol  nebulizer treatment for two hours. She stabilized on this and was able to be  admitted to the hospital on Levaquin 500 mg IV daily and on Solu-Medrol 125  mg IV q.6h.   Within several days she was much improved and Solu-Medrol was able to be  discontinued and she was switched then to prednisone 20 mg b.i.d. and IV  Levaquin switched to Levaquin 500 mg p.o. daily.   By the fifth day she was up and around, color good, breathing well.  Lungs  much improved without intercostal retractions or use of accessory muscles of  respiration.  She was ready for discharge home at that time.   DISCHARGE INSTRUCTIONS:  She is discharged home on Levaquin 500 mg daily for  five days (samples), Tussionex teaspoon b.i.d. for cough (4 ounces), Maxair  auto inhaler  two puffs q.4h. (samples), Restoril for sleep (samples), and  prednisone taper 10 mg two b.i.d. for two days, one b.i.d. for three days,  then one daily.  She is to be seen back in the office in a week.  She will  go back to using her atenolol 50 mg daily, Protonix 40 mg daily, Paxil 20 mg  daily, Zocor 40 mg daily, Arimidex 1 mg daily, and Restoril p.r.n.   FINAL DISCHARGE DIAGNOSES:  1. Chronic obstructive pulmonary disease exacerbation.  2. Coronary artery disease status post stent placement.  3. Breast cancer.  4. History of heavy cigarette smoking.  5. Hyperlipidemia.  6. Insomnia.  7. Depression.  Mila Homer. Sudie Bailey, M.D.   SDK/MEDQ  D:  03/15/2002  T:  03/15/2002  Job:  [id number]

## 2010-05-30 NOTE — Group Therapy Note (Signed)
   NAME:  [name], [name]                            ACCOUNT NO.:  [id number]   MEDICAL RECORD NO.:  [id number]                   PATIENT TYPE:  INP   LOCATION:  A312                                 FACILITY:  [facility]   PHYSICIAN:  Mila Homer. Sudie Bailey, M.D.           DATE OF BIRTH:  [birth year]   DATE OF PROCEDURE:  03/13/2002  DATE OF DISCHARGE:                                   PROGRESS NOTE   HISTORY:  The patient was admitted to the hospital a couple of nights ago  with shortness of breath and found to have asthmatic bronchitis and a white  cell count of 17,000.  He has been on IV antibiotics and steroids since  then.  Feeling a good deal better, but still short of breath.   PHYSICAL EXAMINATION:  VITAL SIGNS:  Temperature 96.3, pulse 96, respiratory  rate 18, blood pressure 117/57.  GENERAL:  Oriented and alert, no acute distress this morning, although  somewhat dyspneic.  Is well-developed, well-nourished.  LUNGS:  Decreased breath sounds throughout with occasional expiratory  rhonchus anteriorly.  There are no intercostal retractions or use of  accessory muscles of respiration.  HEART:  Regular rhythm without murmur.  SKIN:  Color is good, turgor normal.  HEENT:  Mucous membranes are moist.  EXTREMITIES:  No edema of the ankles.   ASSESSMENT:  Chronic obstructive pulmonary disease exacerbation.   PLAN:  Continue steroids and antibiotics.  Hopefully, we will be able to  discharge in a couple of days.                                               Mila Homer. Sudie Bailey, M.D.    SDK/MEDQ  D:  03/13/2002  T:  03/13/2002  Job:  [id number]

## 2010-05-30 NOTE — Group Therapy Note (Signed)
   NAME:  [name], [name]                            ACCOUNT NO.:  [id number]   MEDICAL RECORD NO.:  [id number]                   PATIENT TYPE:  INP   LOCATION:  A312                                 FACILITY:  [facility]   PHYSICIAN:  Edward L. Juanetta Gosling, M.D.             DATE OF BIRTH:  August 11, 1925   DATE OF PROCEDURE:  DATE OF DISCHARGE:                                   PROGRESS NOTE   PROBLEM LIST:  1. Bronchitis.  2. Coronary artery disease, status post stent.  3. Status post left breast cancer.  4. Hyperlipidemia.   [name] says that she feels much better today than yesterday.  She is  coughing some but otherwise, has no complaints.  She denies any nausea or  vomiting, fever or chills, abdominal pain.   PHYSICAL EXAMINATION:  VITAL SIGNS:  Blood pressure 116/59, temperature 97,  pulse 84, respirations 22.  O2 saturation 96% on 2 liters.  GENERAL:  Weight 162.  I&O minus 900.  CHEST:  Much clearer than when I listened to her yesterday.  ABDOMEN:  Soft.  EXTREMITIES:  No edema.   ASSESSMENT:  She seems much better.   PLAN:  Plan is to continue her treatments and medicines without change.                                                  Edward L. Juanetta Gosling, M.D.    ELH/MEDQ  D:  03/12/2002  T:  03/12/2002  Job:  [id number]

## 2010-05-30 NOTE — Discharge Summary (Signed)
NAME:  [name], [name]                            ACCOUNT NO.:  [id number]   MEDICAL RECORD NO.:  [id number]                   PATIENT TYPE:  INP   LOCATION:  A311                                 FACILITY:  [facility]   PHYSICIAN:  Gracelyn Nurse, M.D.              DATE OF BIRTH:  26-Jun-1925   DATE OF ADMISSION:  06/04/2002  DATE OF DISCHARGE:  06/05/2002                                 DISCHARGE SUMMARY   DISCHARGE DIAGNOSES:  1. Chronic obstructive pulmonary disease exacerbation.  2. Coronary artery disease.     A. Status post stent.  3. History of breast cancer.  4. Tobacco abuse.  5. Hyperlipidemia.  6. Insomnia.  7. Depression.   DISCHARGE MEDICATIONS:  1. Z-pack.  2. Albuterol metered dose inhaler 2 puffs q.i.d.  3. Atrovent metered dose inhaler 2 puffs q.i.d.  4. Aspirin 81 mg daily.  5. Zocor 40 mg daily.  6. Paxil 20 mg daily.  7. Arimidex 1 mg daily.  8. Protonix 40 mg daily.  9. Atenolol 50 mg daily.  10.      Tussionex 1 tsp b.i.d. p.r.n.   REASON FOR ADMISSION:  This is a 77 year old white female with history of  COPD.  She began having increased shortness of breath three days ago.  She  saw her primary medical doctor and was given a Z-pack.  Since then, she has  become progressively worse, using her albuterol metered dose inhaler at home  with no relief.  She is not complaining of any fever or chills, but does  have a cough that is productive.   HOSPITAL COURSE:  1. Chronic obstructive pulmonary disease exacerbation.  The patient was     admitted and started on nebulizers, continued on her Zithromax, was given     a dose of IV steroids.  She responded very briskly to treatment.  She     felt much better the next day.  She was not requiring any oxygen.  We     feel she can finish treatment at home.  She will use her albuterol and     Atrovent inhalers four times a day on a scheduled basis and finish up the     Z-pack as prescribed, and follow up with primary  medical doctor as     needed.  2. Coronary artery disease.  This was stable and she will continue on her     current medications.  3. The remainder of her medical problems were stable during her hospital     stay.    DISPOSITION:  The patient is discharged in stable condition.  She will  follow up with her primary care medical doctor as scheduled.  Gracelyn Nurse, M.D.    JDJ/MEDQ  D:  06/05/2002  T:  06/05/2002  Job:  [id number]

## 2010-05-30 NOTE — Group Therapy Note (Signed)
NAME:  [name], [name]                            ACCOUNT NO.:  [id number]   MEDICAL RECORD NO.:  [id number]                   PATIENT TYPE:  EMS   LOCATION:  ED                                   FACILITY:  [facility]   PHYSICIAN:  Mila Homer. Sudie Bailey, M.D.           DATE OF BIRTH:  July 21, 1925   DATE OF PROCEDURE:  DATE OF DISCHARGE:                                   PROGRESS NOTE   SUBJECTIVE:  Coughing for several days with chest congestion.  I talked to  her on the phone this morning and recommended she be seen in the ER because  her coughing had been so bad.   She is on atenolol, Protonix, Paxil and Zocor.   Congestion seems to be coming from the chest rather than the upper  respiratory according to her.   OBJECTIVE:  She is able to ambulate into the emergency room.  She is in no  acute distress.  Color was good.  She is breathing well off oxygen.  There  are no intracostal rectractions or use of accessory muscles of respiration.  The lungs are clear on auscultation.  Heart has a regular rhythm with a rate  of about 70.  The pharynx is mildly reddened.  There were negative anterior  nodes.  TM's are gray, good light reflexes bilaterally.   ASSESSMENT:  Bacterial bronchitis, (466.0).   PLAN:  1. Levaquin 5 mg daily for 10 days.  2. Tussionex one teaspoon p.o. b.i.d.   Both sold at [employer].      ___________________________________________                                            Mila Homer Sudie Bailey, M.D.   SDK/MEDQ  D:  02/03/2003  T:  02/03/2003  Job:  [id number]

## 2010-05-30 NOTE — H&P (Signed)
NAME:  [name], [name]                            ACCOUNT NO.:  [id number]   MEDICAL RECORD NO.:  [id number]                   PATIENT TYPE:  AMB   LOCATION:  DAY                                  FACILITY:  [facility]   PHYSICIAN:  Bernerd Limbo. Leona Carry, M.D.             DATE OF BIRTH:  04/21/1925   DATE OF ADMISSION:  DATE OF DISCHARGE:                                HISTORY & PHYSICAL   HISTORY:  This 77 year old white female is admitted to this hospital for a 77 year old needle localization left breast biopsy.   HISTORY OF PRESENT ILLNESS:  The patient had a lumpectomy performed by Dr.  Malvin Johns in April 2004.  She also had a sentinel node biopsy. She refused  radiation and presently is taking Arimidex for chemotherapy. An incidental  finding recently was the presence of a small, circular mass in the same  breast. This was noted at a follow up mammogram. The radiologist assured her  that it was decreasing in size.  Prior to his giving her this information,  the patient was unaware that there was anything present. She has become  insistent that she wants this removed.  She talked it over with Dr.  Mariel Sleet, who agreed that this probably would be the best course of  treatment.   I reviewed the films with Dr. Alver Fisher on December 2nd, and saw a small, benign-  appearing area on the medial aspect of the left breast, below the nipple at  about 4 o'clock on the dial.  I talked to the patient about this at length.  She is agreeable for the surgery.   PAST MEDICAL HISTORY:  Patient has had hysterectomy. In 1994, she had a  laparoscopic cholecystectomy for gallbladder disease, 1996 the patient had a  left inguinal hernia repair, pelvic laparotomy with removal of ovarian cyst.  In 1999 she had a left partial mastectomy. 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   ALLERGIES:  NO KNOWN DRUG ALLERGIES.   PHYSICAL EXAMINATION:  VITAL SIGNS:  Blood pressure  110/64, pulse 80,  respirations 20.  GENERAL:  A healthy 77 year old white female in no acute distress.  HEENT:  Normal, no jaundice.  NECK:  Supple, thyroid not enlarged, no palpable cervical adenopathy.  LUNGS:  Chest is clear to auscultation and percussion.  CARDIOVASCULAR:  Regular sinus rhythm, no thrills or murmurs.  BREASTS:  Moderate size, nipples are symmetrical. There is a circular,  operative scar in the upper, outer quadrant of the left breast, probably  from the first partial mastectomy in 1999. There is a small transverse  incision below the areola on the left breast, probably secondary to the  lumpectomy performed in April 2004.  No discrete masses are noted. The right  breast appeared to be within normal limits.  Examination of the axillae was  normal.  ABDOMEN:  Soft. There is a  healed, left inguinal operative scar. There are 4  small incisions secondary to previous laparoscopic cholecystectomy  procedure. There is a well-healed transverse Pfannenstiel incision operative  scar. There is no evidence of any hernias.  EXTREMITIES:  Limbs negative.  BACK:  Negative.  PELVIC:  Deferred.   ADMISSION DIAGNOSIS:  Mass in the left breast, rule out cancer.   DISPOSITION:  The patient was admitted for a needle localization, left  breast biopsy.   The surgery, risks, complications and consequences have been discussed with  this patient. She is agreeable to surgery. She has been scheduled for  December 7th.     ___________________________________________                                         Bernerd Limbo. Leona Carry, M.D.   NMD/MEDQ  D:  12/18/2002  T:  12/18/2002  Job:  [id number]

## 2010-05-30 NOTE — Group Therapy Note (Signed)
   NAME:  [name], [name]                            ACCOUNT NO.:  [id number]   MEDICAL RECORD NO.:  [id number]                   PATIENT TYPE:  INP   LOCATION:  A312                                 FACILITY:  [facility]   PHYSICIAN:  Mila Homer. Sudie Bailey, M.D.           DATE OF BIRTH:  [birth year]   DATE OF PROCEDURE:  03/13/2001  DATE OF DISCHARGE:                                   PROGRESS NOTE   SUBJECTIVE:  The patient feels much better than she was a couple of days  ago.  She is now coughing up some thick green slimy stuff.   OBJECTIVE:  She is sitting up in bed, eating breakfast, feet on the floor.  She is in no acute distress, well-developed, well-nourished, oriented and  alert.  Color is good off oxygen.  She is breathing well, moving air well,  good excursion of the rib cage.  There are no intercostal retractions or use  of accessory muscles of respiration.  Lungs show decreased breath sounds  throughout but moving air well.  The heart has a regular rhythm, rate of  about 80.  There is no edema of the ankles.   ASSESSMENT:  Chronic obstructive pulmonary disease exacerbation/acute  bronchitis.   PLAN:  Continue the Protonix 40 mg daily and the albuterol/ipratropium  nebulizer treatments.  Discontinue Solu-Medrol and switch to prednisone 20  mg b.i.d. and switch from IV Levaquin 500 mg daily to p.o. Levaquin 500 mg  daily.  Continue O2 p.r.n. and hope to discharge in one to two days.                                               Mila Homer. Sudie Bailey, M.D.    SDK/MEDQ  D:  03/14/2002  T:  03/14/2002  Job:  [id number]

## 2010-05-30 NOTE — H&P (Signed)
NAME:  [name], [name]                            ACCOUNT NO.:  [id number]   MEDICAL RECORD NO.:  [id number]                   PATIENT TYPE:  INP   LOCATION:  A311                                 FACILITY:  [facility]   PHYSICIAN:  Sarita Bottom, M.D.                  DATE OF BIRTH:  1925/05/12   DATE OF ADMISSION:  06/04/2002  DATE OF DISCHARGE:                                HISTORY & PHYSICAL   PRIMARY CARE PHYSICIAN:  Dr. Metta Clines.   CHIEF COMPLAINT:  I feel short of breath and have been coughing.   [name] is a 77 year old lady with a history of COPD and coronary artery  disease.  She was well until about three days ago when she developed an  insidious onset of shortness of breath, wheezing, and a cough.  She saw Dr.  Metta Clines who prescribed a Z-pack.  She claims she felt a bit better, but she  however continued to wheeze and had a persistent cough, so she came to the  emergency room today for further evaluation.  In the emergency room, the  patient was noted to be hypoxemic on a blood gas that was done on room air.  The decision was therefore made to admit the patient for further treatment.   REVIEW OF SYSTEMS:  She denies any fever or chills or weight loss.  RESPIRATORY:  She admits to cough productive of yellowish to greenish  sputum.  She admits to wheezing and shortness of breath.  CV:  Denies any  chest pain or palpitations.  GI:  Denies any nausea, diarrhea, or vomiting.  CNS:  Denies any headaches or dizziness.  PSYCHIATRIC:  Denies any  depression.  All other systems reviewed and were negative.   PAST MEDICAL HISTORY:  Significant for coronary artery disease.  She is  status post angioplasty with stent placement in 1998.  She has a history of  COPD.  She has a history of depression.  She was treated for breast cancer  with mastectomy one year ago.  She was recently admitted for pneumonia.   CURRENT MEDICATIONS:  1. Atenolol 50 mg daily.  2. Protonix 40 daily.  3. Zocor 40  daily.  4. Arimidex.   ALLERGIES:  She has no known drug allergies.   FAMILY HISTORY:  Significant for cancer in her mother.   SOCIAL HISTORY:  She is a widow with three kids.  She is an ex-smoker.  She  stopped smoking 12 years ago.  She does not drink alcohol.   EXAMINATION:  On examination, her blood pressure is 96/53.  Heart rate is  68.  The patient is afebrile.  GENERAL:  She is an elderly lady lying comfortably in bed, in mild  respiratory distress.  HEAD, EARS, NOSE, AND THROAT:  She is not pale.  She is anicteric.  Pupils  are equal and reactive to light and  accommodation.  Oral mucosa is moist.  NECK:  Supple.  No thyromegaly.  No lymphadenopathy.  CHEST:  Air entry is reduced bilaterally.  She has bilateral expiratory  wheezes.  No crackles were heard.  CV:  Heart sounds 1 and 2 are normal.  Rhythm appears regular.  No murmur is  appreciated.  ABDOMEN:  Soft.  Bowel sounds are present.  No masses or organomegaly were  felt.  CNS:  She is alert and oriented x3.  She has no gross or focal deficits.  EXTREMITIES:  She has no edema.  Pulses are 2+ bilaterally.  She has no  clubbing or cyanosis.   LABS AND DIAGNOSTICS:  The chest x-ray shows hyperinflated lungs suggestive  of COPD.  She has no acute infiltrate.  Official report is pending.  Blood  gas done in the emergency room on room air shows a pH of 7.39, pCO2 of 48.2,  pO2 of 51.1, bicarbonate of 28.7, oxygen saturation of 88.2.  Her BMP is  61.9.  Troponin is 0.02.  CK is 59.  Sodium is 135,  potassium is 4.4, BUN  is 11, creatinine is 0.6, CO2 is 31, glucose is 107.  WBC is 9.2, neutrophil  is 61%.  MCV is 94.3.  Platelet count is 165.  Hemoglobin is 13.7.   ASSESSMENT AND PLAN:  1. Hypoxic respiratory failure, probably from chronic obstructive pulmonary     disease exacerbation and upper respiratory tract infection.  The patient     will be admitted to the ward.  She will be provided supplemental oxygen.     Pulse  oximetry will be monitored.  She will be given albuterol and     Atrovent nebulizer q. 4h and the patient will be continued on Zithromax     500 mg daily.  2. Coronary artery disease, stable at this time.  The patient will be     continued on atenolol 25 mg daily and aspirin 81 mg daily.  3. With a history of depression, the patient will be maintained on Prozac 10     mg daily.  4. Hyperlipidemia.  Zocor will be given, 40 mg daily.  5. Patient to be admitted under the care of primary M.D., Dr. Metta Clines.     Further management will depend on clinical course.  Above plan has been     discussed with the patient and the patient seems agreeable to the plan.                                               Sarita Bottom, M.D.    DW/MEDQ  D:  06/04/2002  T:  06/04/2002  Job:  [id number]

## 2010-05-30 NOTE — Discharge Summary (Signed)
NAME:  [name], [name]                            ACCOUNT NO.:  [id number]   MEDICAL RECORD NO.:  [id number]                   PATIENT TYPE:  INP   LOCATION:  A202                                 FACILITY:  [facility]   PHYSICIAN:  Mila Homer. Sudie Bailey, M.D.           DATE OF BIRTH:  18-May-1925   DATE OF ADMISSION:  03/26/2003  DATE OF DISCHARGE:  03/28/2003                                 DISCHARGE SUMMARY   HISTORY OF PRESENT ILLNESS:  This 77 year old woman was admitted to the  hospital with acute bronchitis.  She had a benign 3 days hospitalization  extending from March 14-16, 2005.  Vital signs remained stable during that  time.   HOSPITAL COURSE:  Her admission Met-7 and CBC were essentially normal.  Chest x-ray showed accentuated lung markings.  She was started on  Albuterol/Atrovent nebulizer treatments q.4h. while awake with O2 at 2  liters a minute by nasal prong.  Solu-Medrol 125 mg IV q.6h. with Zithromax  500 mg IV q.24h. and Levaquin 500 mg IV daily.  She was given a heparin  lock.  Continued on Arimidex 1 mg daily, Protonix 40 mg daily, Paroxetine 20  mg daily, Atenolol 25 mg daily.  She was given p.r.n. Ambien and Tylenol.  She received Phenergan 12.5 IV for nausea.  The second day, the Solu-Medrol  was decreased to 40 mg IV q.6h.  She was allowed to be up in the chair.  By  third day, her lungs were clear throughout.  She was breathing well.  Color  good.  Off of oxygen.  Ready for discharge home.   DISCHARGE MEDICATIONS:  1. Zithromax 5 mg p.o. x5 days.  2. Levaquin 5 mg p.o. daily x10 days.  3. Guaifenesin 600 mg 2 tablets b.i.d. (#30, no refills).  4. Taper prednisone 10 mg 2 b.i.d. x3 days, 1 b.i.d. x3 days, 1 daily     thereafter.  5. Continue with Ambien q.h.s. p.r.n.  6. Arimidex 1 mg daily.  7. Protonix 40 mg daily.  8. Paroxetine 20 mg daily.   FOLLOWUP:  Follow up in the office within a week.   FINAL DISCHARGE DIAGNOSES:  1. Chronic obstructive pulmonary  disease exacerbation.  2. Coronary artery disease status post stent placement.  3. The patient is status post breast cancer treatment.  4. Reflux esophagitis.  5. Depression.   ADDENDUM:  Workup for the patient today including review of her electronic  medical record, review of her paper medical record, talking to the patient,  examining the patient, arranging for medications to be delivered to home,  formulation of plan and dictated note was 35 minutes.     ___________________________________________                                         Mila Homer  Sudie Bailey, M.D.   SDK/MEDQ  D:  03/28/2003  T:  03/29/2003  Job:  [id number]

## 2010-05-30 NOTE — Op Note (Signed)
[facility]  Patient:    [name], [name] Visit Number: [id number] MRN: [id number]          Service Type: END Location: DAY Attending Physician:  Barbaraann Barthel Dictated by:   Barbaraann Barthel, M.D. Proc. Date: 04/20/01 Admit Date:  04/20/2001   CC:         John Giovanni, M.D.  Franky Macho, M.D. (proctor)   Operative Report  PREOPERATIVE DIAGNOSIS:  Adenocarcinoma of the left breast.  POSTOPERATIVE DIAGNOSIS:  PROCEDURE:  Wide excision of left breast (left partial mastectomy) and deep left axillary node biopsy (sentinel lymph node biopsy).  SURGEON:  Barbaraann Barthel, M.D.  ANESTHESIA:  General anesthesia.  NOTE:  This is a 77 year old white female who had biopsy-proven adenocarcinoma of the left breast.  It was approximately 1 cm in diameter.  It was well-differentiated carcinoma of the breast, located in approximately the 9 oclock position of the left breast, just off of the nipple-areolar complex. The patient had extensive consultation preoperatively and various options were discussed with her including wide excision, axillary node biopsy and sentinel lymph node biopsy procedure and left modified radical mastectomy.  After great consideration with her daughter, who incidentally is herself a breast cancer survivor, she opted for a wide excision of the biopsy scar with a sentinel lymph node biopsy procedure.  We discussed complications including bleeding and infection and the possibility that further surgery may be required.  Informed consent was obtained.  GROSS OPERATIVE FINDINGS:  The patient had a healing biopsy scar from her previous biopsy site approximately a week previously in her left breast.  The patient had skin counts around 292 to low 300s over the area of the injected Lymphazurin 1% dye and in vivo counts of the sentinel lymph node encountered were 1493, with ex vivo being approximately 1599.  The lymph node was  colored blue.  The axillary basin counts per second diminished the further up towards the axillary vein we went, around 126 at that point.  There was clearly an area demarcated with high counts in the area where we performed the biopsy procedure.  TECHNIQUE:  The patient was placed in the supine position.  After the adequate administration of general anesthesia via endotracheal intubation, her left hemithorax and axillary area were prepped with Betadine solution and draped in the usual manner.  As stated in the radiology department, the patient had received the nuclear reactant two hours prior to surgery and then I injected intradermally approximately 4 cc of Lymphazurin 1% dye around the incision biopsy site.  This was massaged by the nursing staff for five minutes and then after prepping and draping in the usual manner, I checked the counts, with the above counts recorded.  We then checked the area in the axilla and we immediately had high counts per second in this area and made an incision over this area; we have in vivo counts of almost 1500.  Ex vivo counts when I removed the lymph node, which was a bluish hue, were approximately 1599-1600 counts per minute.  This was removed.  I checked again with a probe; clearly all other counts were 10% of these recorded numbers.  I then irrigated the axillary dissection site with sterile water and our attention was then turned to the area of the previous biopsy site, where a wide excision was made of the biopsy site and the medial superior margin of the biopsy site was marked with a nylon suture to orient the pathologist for being  certain that there was no residual tumor in the area of the biopsy site.  This area was again irrigated with sterile water.  The subcutaneous layer was closed in the breast tissue with 3-0 Polysorb and the skin approximated with 5-0 subcuticular Polysorb sutures.  Steri-Strips were used to further approximate the skin.   The pathologist rendered the diagnosis that the lymph node was negative and we then closed the axillary area, again checking for hemostasis, and closing the subcutaneous layer with 3-0 Polysorb and the skin with a 5-0 Polysorb sutures.  Steri-Strips and sterile OpSite dressings with Neosporin were applied over both areas.  Patient was taken to the recovery room in satisfactory condition.  Estimated blood loss -- minimal.  The patient received some 1600 cc of crystalloids intraoperatively, no drains were placed and there were no complications.Dictated by:   Barbaraann Barthel, M.D.  Attending Physician:  Barbaraann Barthel DD:  04/20/01 TD:  04/21/01 Job: [id number]

## 2010-05-30 NOTE — Cardiovascular Report (Signed)
[place]. [facility]  Patient:    [name], [name]                         MRN: [id number] Adm. Date:  [id number] Attending:  Mirian Mo CC:         Kari Baars, M.D.  Thomas C. Wall, M.D. [facility]   Cardiac Catheterization  PROCEDURE:  Coronary arteriography.  INDICATION:  Recurrent chest pain, status post previous stenting to the proximal LAD and proximal right coronary artery, as well as angioplasty of an obtuse marginal branch.  FINDINGS:  Left main coronary artery had a 30% discrete stenosis.  The proximal stent in the LAD was widely patent.  Mid LAD had 30% multiple discrete lesions.  First diagonal branch had a 30% ostial lesion.  The second diagonal branch was a very large vessel with a 30% ostial lesion.  The distal LAD was small but without significant focal lesions.  Circumflex coronary artery was non-dominant.  There was a 50% discrete lesion in the large obtuse first marginal branch.  Right coronary artery was dominant.  The proximal stents were widely patent. There was 30% multiple discrete disease in the mid and distal vessel.  RIGHT ANTERIOR OBLIQUE VENTRICULOGRAPHY:  RAO ventriculography was normal and there was hyperdynamic LV function.  LV pressure was 130/17.  Aortic pressure was 128/59.  IMPRESSION:  Patient does not appear to have critical coronary disease.  I do not think the lesion in her obtuse marginal branch would account for her symptoms.  She does have a large hiatal hernia.  We will start her on proton pump inhibitors.  She will be discharged tomorrow morning.  She will follow up with Dr. Maisie Fus C. Wall in [place].  An outpatient Cardiolite in three to four weeks would be reasonable to make sure the circumflex territory is nonischemic.  Patient tolerated the procedure well. DD:  09/03/99 TD:  09/04/99 Job: [id number]

## 2010-05-30 NOTE — Op Note (Signed)
[facility]  Patient:    [name], [name] Visit Number: [id number] MRN: [id number]          Service Type: OUT Location: RAD Attending Physician:  John Giovanni Dictated by:   Barbaraann Barthel, M.D. Proc. Date: 04/13/01 Admit Date:  04/06/2001 Discharge Date: 04/06/2001   CC:         John Giovanni, M.D.   Operative Report  PREOPERATIVE DIAGNOSIS:  Abnormal left breast mammogram.  POSTOPERATIVE DIAGNOSIS:  Abnormal left breast mammogram.  OPERATION:  Left partial mastectomy.  SURGEON:  Barbaraann Barthel, M.D.  INDICATIONS:  This is a 77 year old white female who presented with a nodule in her left mammogram in approximately the 9 oclock position.  It was approximately 1 cm or so in diameter.  She had a strong family history of breast carcinoma including her mother and her sister.  Surgical biopsy was recommended.  FINDINGS:  Gross operative findings were a solid lesion that appeared smooth and approximately 1 cm or so in diameter at the 9 oclock position localized by needle localization and confirmed by specimen mammography.  DESCRIPTION OF PROCEDURE:  The patient was placed in the supine position. After the adequate administration of LMA anesthesia, the left hemithorax was prepped with Betadine solution and draped in the usual manner.  A horizontal incision was carried out over the palpated wire removing the skin with it and as to not dislodge the wire and then carefully dissecting free around the nodule and removing this in toto.  Specimen mammography revealed that the lesion was present.  The wound was then irrigated with sterile water, and the bleeding was controlled with a cautery device, and the breast tissue approximated with 3-0 Polysorb and the skin cosmetically closed with a 5-0 subcuticular suture.  Steri-Strips, Neosporin and a sterile dressing were applied.  Prior to closure, all sponge, needle and instrument counts were found  to be correct.  Estimated blood loss was minimal.  The patient received 800 cc of crystalloids intraoperatively.  No drains were placed.  The patient was taken to the recovery room in satisfactory condition. Dictated by:   Barbaraann Barthel, M.D. Attending Physician:  John Giovanni DD:  04/13/01 TD:  04/14/01 Job: [id number]

## 2010-05-30 NOTE — Discharge Summary (Signed)
[place]. [facility]  Patient:    [name], [name]                         MRN: [id number] Adm. Date:  [id number] Disc. Date: [id number] Attending:  Mirian Mo Dictator:   Abelino Derrick, P.A.C. [facility] CC:         Kari Baars, M.D., [address] [place], [place] [address]                  Referring Physician Discharge [place]  DISCHARGE DIAGNOSES: 1. Unstable angina, nonobstructive coronary disease by catheterization this    admission. 2. Previous percutaneous coronary interventions in 1996, 1999, and February    2000. 3. Hyperlipidemia.  HOSPITAL COURSE:  [name] is a 77 year old female who is followed by Dr. Juanetta Gosling.  She had been seen in the past by Dr. Allyson Sabal at [facility].  She had an LAD stent in 1996, a circumflex stent in 1999, and another intervention in February 2000.  She has done well until August 20, when she presented with chest fullness and pain across her shoulders, similar to pre-angioplasty symptoms in the past.  She requested transfer to our care.  She was seen initially in the emergency room at [facility] and transferred to [facility].  Chest x-ray showed no active disease.  EKG showed T wave inversion in V3, which was new.  CK-MBs and troponins were negative.  She was transferred to [facility], set up for cardiac catheterization, which was done September 03, 1999.  She was put on Lovenox on admission, and this was held the morning of the catheterization.  Catheterization revealed 20% left main, widely patent LAD stent site, 50% OM-1 of the circumflex, 20% narrowing at the RCA stent site, and good LV function. She tolerated the procedure well.  Her Lovenox was discontinued.  She was put on Protonix and her Toprol was increased.  She is discharged August 23 and will follow up with Dr. Daleen Squibb in [place] in about four weeks.  DISCHARGE MEDICATIONS: 1. Protonix 40 mg a day. 2. Coated aspirin q.d. 3. Toprol XL 75 mg a  day. 4. Zocor 80 mg a day. 5. Nitroglycerin sublingual p.r.n.  LABORATORY DATA:  Lipid profile shows a total cholesterol of 176, triglycerides 203, HDL 37, LDL 98.  Labs from [facility] revealed a white count of 8.4, hemoglobin 14.2, hematocrit 42.3, platelets 151.  INR 1.03.  BUN 13, creatinine 0.8, sodium 137, potassium 3.8.  Liver functions were normal.  CK-MB and troponin were negative.  DISPOSITION:  Patient is discharged in stable condition.  FOLLOW-UP:  With Dr. Daleen Squibb as noted. DD:  09/04/99 TD:  09/04/99 Job: [id number]

## 2010-05-30 NOTE — Op Note (Signed)
NAME:  [name], [name]                            ACCOUNT NO.:  [id number]   MEDICAL RECORD NO.:  [id number]                   PATIENT TYPE:  AMB   LOCATION:  DAY                                  FACILITY:  [facility]   PHYSICIAN:  Bernerd Limbo. Leona Carry, M.D.             DATE OF BIRTH:  23-Nov-1925   DATE OF PROCEDURE:  12/19/2002  DATE OF DISCHARGE:                                 OPERATIVE REPORT   PREOPERATIVE DIAGNOSIS:  Mass in the left breast.   POSTOPERATIVE DIAGNOSIS:  Mass in the left breast.   PROCEDURE:  Left partial mastectomy.   SURGEON:  Bernerd Limbo. Destefano, M.D.   DESCRIPTION OF PROCEDURE:  Under adequate general anesthesia, the patient  was prepped and draped in the usual manner.   An elliptical incision was made in the inferior portion of the areola  extending from about 2 o'clock to 6 o'clock.  Dissection was carried through  the subcutaneous tissues, and a large amount of fatty tissue with a small  amount of breast tissue was removed.  Frozen section revealed this to be  relatively benign.  Dr. Ola Spurr did see some adenomas in it, and we await  permanent sections, but he did not feel that this was a recurrent  malignancy.  Bleeders were electrofulgurated.  The subcutaneous tissues were  closed with interrupted 2-0 Vicryl.  The second layer of fat and  subcutaneous tissue was closed with interrupted 3-0 Vicryl, and the skin was  closed with a continuous subcuticular suture of 3-0 Vicryl.  A Telfa and  OpSite dressing was applied.   It should be mentioned that frozen section was negative.  I was concerned  that the previous surgery that revealed ductal cancer may be related in some  way to the ductal hyperplasia on the breast biopsy I did in 1999 on this  patient.   The patient tolerated the procedure nicely and left the room in good  condition.      ___________________________________________                                            Bernerd Limbo. Leona Carry, M.D.   NMD/MEDQ   D:  12/19/2002  T:  12/19/2002  Job:  [id number]

## 2010-05-30 NOTE — H&P (Signed)
NAME:  [name], [name] NO.:  [id number]   MEDICAL RECORD NO.:  [id number]                   PATIENT TYPE:  EMS   LOCATION:  ED                                   FACILITY:  [facility]   PHYSICIAN:  Gracelyn Nurse, M.D.              DATE OF BIRTH:  05-Aug-1925   DATE OF ADMISSION:  03/11/2002  DATE OF DISCHARGE:                                HISTORY & PHYSICAL   CHIEF COMPLAINT:  Shortness of breath.   HISTORY OF PRESENT ILLNESS:  This is a 77 year old white female who presents  with shortness of breath.  This started yesterday with a cough that is  nonproductive, and wheezing.  She was seen in the ED last night, given an  albuterol metered dose inhaler and prednisone.  She lives alone and today  she says she is feeling worse, and she presents back to the ED today.  She  has had no fever or chills and no chest pain.   PAST MEDICAL HISTORY:  1. Coronary artery disease status post stent.  2. Left breast cancer status post lumpectomy.  3. Hyperlipidemia.  4. Status post hysterectomy.   ALLERGIES:  CEFTIN.   CURRENT MEDICATIONS:  1. Atenolol 50 mg daily.  2. Protonix 40 mg daily.  3. Paxil 20 mg daily.  4. Zocor 40 mg daily.  5. Restoril 30 mg q.h.s. p.r.n.  6. Arimidex 1 mg daily.   SOCIAL HISTORY:  She used to be a heavy smoker but stopped several years  ago, does not drink alcohol.  She is widowed, has three children.   FAMILY HISTORY:  She has a daughter with breast cancer.  Her father died of  an MI and she had a brother who died of an MI.   REVIEW OF SYSTEMS:  As per HPI.  The remainder of her systems are negative.   VITAL SIGNS:  Temperature 97.5, pulse 87, respirations 24, blood pressure  138/65, O2 saturation 94% on room air.   ADMITTING LABORATORY DATA:  Sodium 135, potassium 3.8, chloride 102, CO2 35,  BUN 15, creatinine 0.7, glucose 134.  A pH is 7.39, PCO2 46, PO2 63, and  bicarb 29.  White blood cells 17.1, hemoglobin 13.4, and  platelets 166.  Chest x-ray shows no infiltrates, some peribronchial thickening.   ASSESSMENT AND PLAN:  1. Chronic obstructive pulmonary disease exacerbation.  She does not carry a     diagnosis of COPD but she has a significant smoking history and her chest     x-ray is consistent with COPD.  She has hyperinflated lungs so feel     certain that she does have some underlying COPD, which she may have some     bronchitis now that is exacerbating this.  Will treat with frequent     nebulizers and some IV steroids.  2. Bronchitis.  Will start her on some antibiotics.  I did not see any frank     pneumonia on her chest x-ray.  3. Coronary artery disease.  Will continue her current medications.  4. Breast cancer.  Will continue her on her Arimidex.                                               Gracelyn Nurse, M.D.    JDJ/MEDQ  D:  03/11/2002  T:  03/11/2002  Job:  [id number]

## 2010-05-30 NOTE — H&P (Signed)
NAME:  [name], [name]                            ACCOUNT NO.:  [id number]   MEDICAL RECORD NO.:  [id number]                   PATIENT TYPE:  INP   LOCATION:  A202                                 FACILITY:  [facility]   PHYSICIAN:  Mila Homer. Sudie Bailey, M.D.           DATE OF BIRTH:  10/26/1925   DATE OF ADMISSION:  03/26/2003  DATE OF DISCHARGE:  03/28/2003                                HISTORY & PHYSICAL   This 77 year old woman presented to the office today with coughing and  wheezing.  She had so much wheezing last night that she could not sleep  well.  She also had some diarrhea.  She has been on a prednisone taper and  Augmentin for asthmatic bronchitis and really has not felt like she is  better.   She has a history including CAD, status post angioplasty in 1985 and  angioplasty with two stents in 1995.  A laparoscopic cholecystectomy in 1998  at [facility].  She has had hypercholesterolemia, depression, GERD,  insomnia, COPD, osteoporosis, and breast cancer.   She smoked from 1946 to 1997 but has not smoked since then.   CURRENT MEDICATIONS:  1. Atenolol 25 mg q.d.  2. Protonix 40 mg p.o. q.d.  3. Fluoxetine 20 mg q.d.  4. Zocor 80 mg q.d.  5. Arimidex 1.0 mg q.d.  6. Pamidronate IV every 3 months.  7. Zetia 10 mg q.d.  8. Niaspan 5 mg p.r.n.  9. Caltrate 600 plus D b.i.d.  10.      Vitamin C 500 mg, vitamin E 400 IU, vitamin D 4 IU q.d.   She cannot tolerate TOPROL, which causes weakness.   She currently lives by herself in a retirement complex.   PHYSICAL EXAMINATION:  VITAL SIGNS:  BP 110/64, pulse 64.  Respiratory rate  was about 25.  GENERAL:  Initial exam showed a pleasant elderly woman whose weight is 160  pounds.  Height 5 feet 5 inches.  Her color is good.  LUNGS:  I could hear expiratory wheezing throughout the lungs, but she is  moving air well and had no intercostal retractions.  She had a wheezing-type  cough.  HEART:  Regular rhythm without murmur  at a rate of about 70.  ABDOMEN:  Benign.  EXTREMITIES:  There is no edema in the ankles.   ADMISSION DIAGNOSIS:  1. Asthmatic bronchitis.  2. History of cigarette smoking.  3. Chronic obstructive pulmonary disease.  4. Coronary artery disease, status post angioplasty and stent placement.   We are admitting to the hospital on O2 at 2 L per minute via nasal prongs  with albuterol and Atrovent neb treatments q.4h. while awake, Solu-Medrol  125 mg IV q.6h., Zithromax 500 mg IV q.24h., Levaquin 500 mg IV q.d.,  Arimidex 4 mg q.d., Protonix 40 mg q.d., fluoxetine 20 mg q.d., atenolol 25  mg q.d., Ambien 5 mg q.h.s. p.r.n. sleep, and  Tylenol 650 q.4h. p.r.n.     ___________________________________________                                         Mila Homer. Sudie Bailey, M.D.   SDK/MEDQ  D:  04/17/2003  T:  04/17/2003  Job:  [id number]

## 2010-05-30 NOTE — Group Therapy Note (Signed)
NAME:  [name], [name]                            ACCOUNT NO.:  [id number]   MEDICAL RECORD NO.:  [id number]                   PATIENT TYPE:  INP   LOCATION:  A202                                 FACILITY:  [facility]   PHYSICIAN:  Mila Homer. Sudie Bailey, M.D.           DATE OF BIRTH:  1925-02-03   DATE OF PROCEDURE:  03/27/2003  DATE OF DISCHARGE:                                   PROGRESS NOTE   SUBJECTIVE:  The patient is feeling a good deal better than yesterday.  She  was having extensive wheezing, shortness of breath yesterday, and had not  responded to outpatient treatment over a month.   OBJECTIVE:  Today temperature is 96.2, pulse 99, respiratory rate 20, blood  pressure 111/57.  O2 saturation on 3 L is 93%.  The lungs are much clearer  today.  There is occasional wheeze but she is moving air well.  There are no  intercostal retractions, no use of accessory muscles of respiration.  Heart  has a regular rhythm without murmur, rate of about 80 sitting.  There is no  edema of the ankles.  Skin color is good.   ASSESSMENT:  1. Asthmatic bronchitis.  2. Chronic obstructive pulmonary disease.  3. Coronary artery disease.  4. Status post breast cancer.   PLAN:  Continue azithromycin 500 mg IV daily with Levaquin 500 mg IV daily  as well as albuterol/ipratropium nebulizer treatments q.4h. while awake.  Decrease the Solu-Medrol from 125 mg q.6h. to 40 mg IV q.6h.Marland Kitchen  Continue with  pantoprazole 40 mg p.o. daily and fluoxetine 20 mg daily with p.r.n. Ambien  5 mg q.h.s., Phenergan 25 mg q.4h., and continue her Arimidex 1 mg daily.  Hopefully will be able to switch her to p.o. steroids tomorrow and be able  to get home by the following day.      ___________________________________________                                            Mila Homer. Sudie Bailey, M.D.   SDK/MEDQ  D:  03/27/2003  T:  03/27/2003  Job:  [id number]

## 2010-07-03 ENCOUNTER — Other Ambulatory Visit (HOSPITAL_COMMUNITY): Payer: Self-pay | Admitting: Oncology

## 2010-07-03 DIAGNOSIS — Z09 Encounter for follow-up examination after completed treatment for conditions other than malignant neoplasm: Secondary | ICD-10-CM

## 2010-07-04 ENCOUNTER — Ambulatory Visit (HOSPITAL_COMMUNITY): Payer: Self-pay

## 2010-07-23 ENCOUNTER — Ambulatory Visit (HOSPITAL_COMMUNITY)
Admission: RE | Admit: 2010-07-23 | Discharge: 2010-07-23 | Disposition: A | Discharge status: Home / Self Care | Payer: Medicare Other | Source: Ambulatory Visit | Attending: Oncology | Admitting: Oncology

## 2010-07-23 DIAGNOSIS — Z09 Encounter for follow-up examination after completed treatment for conditions other than malignant neoplasm: Secondary | ICD-10-CM | POA: Insufficient documentation

## 2010-07-23 DIAGNOSIS — N63 Unspecified lump in unspecified breast: Secondary | ICD-10-CM | POA: Insufficient documentation

## 2010-08-06 ENCOUNTER — Ambulatory Visit (HOSPITAL_COMMUNITY): Payer: Self-pay | Admitting: Oncology

## 2010-09-09 ENCOUNTER — Encounter (HOSPITAL_COMMUNITY)
Admission: RE | Disposition: A | Discharge status: Home / Self Care | Payer: Self-pay | Source: Ambulatory Visit | Attending: Ophthalmology

## 2010-09-09 ENCOUNTER — Ambulatory Visit (HOSPITAL_COMMUNITY)
Admission: RE | Admit: 2010-09-09 | Discharge: 2010-09-09 | Disposition: A | Discharge status: Home / Self Care | Payer: Medicare Other | Source: Ambulatory Visit | Attending: Ophthalmology | Admitting: Ophthalmology

## 2010-09-09 DIAGNOSIS — H26499 Other secondary cataract, unspecified eye: Secondary | ICD-10-CM | POA: Insufficient documentation

## 2010-09-09 SURGERY — TREATMENT, USING YAG LASER
Anesthesia: LOCAL | Site: Eye | Laterality: Left

## 2010-09-09 MED ORDER — TROPICAMIDE 1 % OP SOLN
OPHTHALMIC | Status: AC
  Administered 2010-09-09: 1 [drp] via OPHTHALMIC
  Filled 2010-09-09: qty 3

## 2010-09-09 MED ORDER — TROPICAMIDE 1 % OP SOLN
1.0000 [drp] | OPHTHALMIC | Status: AC
  Administered 2010-09-09 (×2): 1 [drp] via OPHTHALMIC

## 2010-09-09 NOTE — Op Note (Signed)
SOFHIA ULIBARRI 09/09/2010  Shantara Goosby T. Nile Riggs  Yag Laser Self Test Completedyes. Procedure: Posterior Capsulotomy, left eye.  Eye Protection Worn by Staff yes. Laser In Use Sign on Door yes.  Laser: Nd:YAG Spot Size: Fixed Burst Mode: I Power Setting: 3.3 mJ/burst  Number of shots: 15 Total energy delivered: 49.4 mJ  Patency of the peripheral iridotomy was confirmed visually.  The patient tolerated the procedure without difficulty. No complications were encountered.    The patient was discharged home with the instructions to continue all her current glaucoma medications, if any.   Patient instructed to go to office at 1300 for intraocular pressure check.  Patient verbalizes understanding of discharge instructions yes.

## 2010-09-09 NOTE — H&P (Signed)
No change in H and P 

## 2010-09-23 ENCOUNTER — Encounter (HOSPITAL_COMMUNITY): Payer: Self-pay | Admitting: *Deleted

## 2010-09-23 ENCOUNTER — Encounter (HOSPITAL_COMMUNITY)
Admission: RE | Disposition: A | Discharge status: Home / Self Care | Payer: Self-pay | Source: Ambulatory Visit | Attending: Ophthalmology

## 2010-09-23 ENCOUNTER — Ambulatory Visit (HOSPITAL_COMMUNITY)
Admission: RE | Admit: 2010-09-23 | Discharge: 2010-09-23 | Disposition: A | Discharge status: Home / Self Care | Payer: Medicare Other | Source: Ambulatory Visit | Attending: Ophthalmology | Admitting: Ophthalmology

## 2010-09-23 DIAGNOSIS — H26499 Other secondary cataract, unspecified eye: Secondary | ICD-10-CM | POA: Insufficient documentation

## 2010-09-23 SURGERY — TREATMENT, USING YAG LASER
Anesthesia: LOCAL | Laterality: Right

## 2010-09-23 MED ORDER — TROPICAMIDE 1 % OP SOLN
OPHTHALMIC | Status: AC
  Administered 2010-09-23: 1 [drp] via OPHTHALMIC
  Filled 2010-09-23: qty 3

## 2010-09-23 MED ORDER — TROPICAMIDE 1 % OP SOLN
1.0000 [drp] | OPHTHALMIC | Status: AC
  Administered 2010-09-23 (×2): 1 [drp] via OPHTHALMIC

## 2010-09-23 NOTE — Progress Notes (Signed)
No change in H and P 

## 2010-09-23 NOTE — Op Note (Signed)
GARLAND SMOUSE 09/23/2010  Ameila Weldon T. Nile Riggs  Yag Laser Self Test Completedyes. Procedure: Posterior Capsulotomy, right eye.  Eye Protection Worn by Staff yes. Laser In Use Sign on Door yes.  Laser: Nd:YAG Spot Size: Fixed Burst Mode: III Power Setting: 3.3 mJ/burst  Number of shots: 15 Total energy delivered: 38.6 mJ  Patency of the peripheral iridotomy was confirmed visually.  The patient tolerated the procedure without difficulty. No complications were encountered.    The patient was discharged home with the instructions to continue all her current glaucoma medications, if any.   Patient instructed to go to office at 1pm} for intraocular pressure check.  Patient verbalizes understanding of discharge instructions yes.

## 2010-10-03 LAB — COMPREHENSIVE METABOLIC PANEL
ALT: 16
AST: 19
Albumin: 3.5
CO2: 32
Calcium: 9.6
GFR calc Af Amer: >60
GFR calc non Af Amer: >60
Sodium: 141

## 2010-10-03 LAB — CBC
MCHC: 34.7
RBC: 4.62
WBC: 8.4

## 2010-10-08 LAB — DIFFERENTIAL
Basophils Absolute: 0
Basophils Absolute: 0.1
Basophils Relative: 0
Basophils Relative: 0
Basophils Relative: 1
Eosinophils Absolute: 0.3
Eosinophils Absolute: 0.4
Eosinophils Absolute: 0.5
Eosinophils Relative: 2
Eosinophils Relative: 3
Lymphocytes Relative: 14
Lymphs Abs: 2.4
Monocytes Absolute: 0.9
Monocytes Absolute: 1.2 — ABNORMAL HIGH
Monocytes Relative: 8
Monocytes Relative: 8
Neutro Abs: 12.5 — ABNORMAL HIGH
Neutro Abs: 8.4 — ABNORMAL HIGH
Neutrophils Relative %: 76
Neutrophils Relative %: 79 — ABNORMAL HIGH

## 2010-10-08 LAB — BASIC METABOLIC PANEL
BUN: 15
CO2: 28
Chloride: 105
Creatinine, Ser: 0.65
Glucose, Bld: 137 — ABNORMAL HIGH

## 2010-10-08 LAB — CBC
HCT: 40.4
Hemoglobin: 14.3
Hemoglobin: 14.4
MCHC: 34.9
MCHC: 35
MCHC: 35.6
MCV: 95.2
MCV: 95.6
MCV: 95.9
Platelets: 126 — ABNORMAL LOW
Platelets: 144 — ABNORMAL LOW
RBC: 4.22
RBC: 4.26
RDW: 13.6
RDW: 13.6
RDW: 13.7
WBC: 16.4 — ABNORMAL HIGH

## 2010-10-08 LAB — TYPE AND SCREEN
ABO/RH(D): B NEG
Antibody Screen: NEGATIVE

## 2010-10-09 LAB — COMPREHENSIVE METABOLIC PANEL
Albumin: 3.6
BUN: 9
Calcium: 9.2
Creatinine, Ser: 0.6
Glucose, Bld: 105 — ABNORMAL HIGH
Potassium: 3.6
Total Protein: 6.5

## 2010-10-13 LAB — COMPREHENSIVE METABOLIC PANEL
AST: 20
Albumin: 3.6
Chloride: 106
Creatinine, Ser: 0.6
GFR calc Af Amer: >60
Potassium: 3.7
Total Bilirubin: 0.7

## 2010-10-23 ENCOUNTER — Ambulatory Visit (INDEPENDENT_AMBULATORY_CARE_PROVIDER_SITE_OTHER): Payer: Medicare Other | Admitting: Otolaryngology

## 2010-10-23 LAB — BASIC METABOLIC PANEL
BUN: 8
CO2: 29
GFR calc non Af Amer: >60
Glucose, Bld: 100 — ABNORMAL HIGH
Potassium: 3.9

## 2010-10-29 LAB — COMPREHENSIVE METABOLIC PANEL
ALT: 18
Alkaline Phosphatase: 63
CO2: 36 — ABNORMAL HIGH
Calcium: 9.2
Chloride: 106
GFR calc non Af Amer: >60
Glucose, Bld: 112 — ABNORMAL HIGH
Potassium: 4.1
Sodium: 139
Total Bilirubin: 0.5

## 2011-11-02 ENCOUNTER — Emergency Department (HOSPITAL_COMMUNITY)
Admission: EM | Admit: 2011-11-02 | Discharge: 2011-11-02 | Disposition: A | Discharge status: Home / Self Care | Payer: Medicare Other | Attending: Emergency Medicine | Admitting: Emergency Medicine

## 2011-11-02 ENCOUNTER — Encounter (HOSPITAL_COMMUNITY): Payer: Self-pay | Admitting: *Deleted

## 2011-11-02 ENCOUNTER — Emergency Department (HOSPITAL_COMMUNITY): Payer: Medicare Other

## 2011-11-02 DIAGNOSIS — J449 Chronic obstructive pulmonary disease, unspecified: Secondary | ICD-10-CM | POA: Insufficient documentation

## 2011-11-02 DIAGNOSIS — K529 Noninfective gastroenteritis and colitis, unspecified: Secondary | ICD-10-CM

## 2011-11-02 DIAGNOSIS — I712 Thoracic aortic aneurysm, without rupture, unspecified: Secondary | ICD-10-CM | POA: Insufficient documentation

## 2011-11-02 DIAGNOSIS — I10 Essential (primary) hypertension: Secondary | ICD-10-CM | POA: Insufficient documentation

## 2011-11-02 DIAGNOSIS — J4489 Other specified chronic obstructive pulmonary disease: Secondary | ICD-10-CM | POA: Insufficient documentation

## 2011-11-02 DIAGNOSIS — E7800 Pure hypercholesterolemia, unspecified: Secondary | ICD-10-CM | POA: Insufficient documentation

## 2011-11-02 DIAGNOSIS — I2510 Atherosclerotic heart disease of native coronary artery without angina pectoris: Secondary | ICD-10-CM | POA: Insufficient documentation

## 2011-11-02 DIAGNOSIS — Z853 Personal history of malignant neoplasm of breast: Secondary | ICD-10-CM | POA: Insufficient documentation

## 2011-11-02 DIAGNOSIS — N200 Calculus of kidney: Secondary | ICD-10-CM | POA: Insufficient documentation

## 2011-11-02 DIAGNOSIS — F411 Generalized anxiety disorder: Secondary | ICD-10-CM | POA: Insufficient documentation

## 2011-11-02 DIAGNOSIS — Z8719 Personal history of other diseases of the digestive system: Secondary | ICD-10-CM | POA: Insufficient documentation

## 2011-11-02 DIAGNOSIS — F3289 Other specified depressive episodes: Secondary | ICD-10-CM | POA: Insufficient documentation

## 2011-11-02 DIAGNOSIS — F17200 Nicotine dependence, unspecified, uncomplicated: Secondary | ICD-10-CM | POA: Insufficient documentation

## 2011-11-02 DIAGNOSIS — F329 Major depressive disorder, single episode, unspecified: Secondary | ICD-10-CM | POA: Insufficient documentation

## 2011-11-02 DIAGNOSIS — K5289 Other specified noninfective gastroenteritis and colitis: Secondary | ICD-10-CM | POA: Insufficient documentation

## 2011-11-02 HISTORY — DX: Pure hypercholesterolemia, unspecified: E78.00

## 2011-11-02 HISTORY — DX: Atherosclerotic heart disease of native coronary artery without angina pectoris: I25.10

## 2011-11-02 HISTORY — DX: Essential (primary) hypertension: I10

## 2011-11-02 LAB — COMPREHENSIVE METABOLIC PANEL
ALT: 16 U/L (ref 0–35)
AST: 20 U/L (ref 0–37)
Albumin: 3.7 g/dL (ref 3.5–5.2)
CO2: 26 mEq/L (ref 19–32)
Calcium: 9.6 mg/dL (ref 8.4–10.5)
Chloride: 101 mEq/L (ref 96–112)
GFR calc non Af Amer: 89 mL/min — ABNORMAL LOW (ref >90)
Sodium: 137 mEq/L (ref 135–145)
Total Bilirubin: 0.4 mg/dL (ref 0.3–1.2)

## 2011-11-02 LAB — CBC WITH DIFFERENTIAL/PLATELET
Basophils Absolute: 0 10*3/uL (ref 0.0–0.1)
Basophils Relative: 0 % (ref 0–1)
Lymphocytes Relative: 19 % (ref 12–46)
MCHC: 33.5 g/dL (ref 30.0–36.0)
Neutro Abs: 6.6 10*3/uL (ref 1.7–7.7)
Platelets: 128 10*3/uL — ABNORMAL LOW (ref 150–400)
RDW: 12.7 % (ref 11.5–15.5)
WBC: 9.6 10*3/uL (ref 4.0–10.5)

## 2011-11-02 MED ORDER — DIPHENOXYLATE-ATROPINE 2.5-0.025 MG PO TABS: 1.0000 | ORAL_TABLET | Freq: Four times a day (QID) | ORAL | Status: DC | PRN

## 2011-11-02 MED ORDER — KETOROLAC TROMETHAMINE 30 MG/ML IJ SOLN: 30.0000 mg | Freq: Once | INTRAMUSCULAR | Status: DC

## 2011-11-02 MED ORDER — SODIUM CHLORIDE 0.9 % IV BOLUS (SEPSIS)
1000.0000 mL | Freq: Once | INTRAVENOUS | Status: AC
  Administered 2011-11-02: 1000 mL via INTRAVENOUS

## 2011-11-02 MED ORDER — MORPHINE SULFATE 2 MG/ML IJ SOLN
2.0000 mg | Freq: Once | INTRAMUSCULAR | Status: DC
  Filled 2011-11-02: qty 1

## 2011-11-02 MED ORDER — ONDANSETRON 4 MG PO TBDP: ORAL_TABLET | ORAL | Status: DC

## 2011-11-02 MED ORDER — ONDANSETRON HCL 4 MG/2ML IJ SOLN
4.0000 mg | Freq: Once | INTRAMUSCULAR | Status: AC
  Administered 2011-11-02: 4 mg via INTRAVENOUS
  Filled 2011-11-02: qty 2

## 2011-11-02 NOTE — ED Notes (Addendum)
NVD since yesterday, Pain RLQ.  Took 2 otc meds for diarrhea  This am.

## 2011-11-02 NOTE — ED Provider Notes (Signed)
History    This chart was scribed for Geoffery Lyons, MD, MD by Smitty Pluck. The patient was seen in room APA09 and the patient's care was started at 2:50PM.   CSN: [id number]  Arrival date & time 11/02/11  1423      Chief Complaint  Patient presents with  . Emesis    (Consider location/radiation/quality/duration/timing/severity/associated sxs/prior treatment) Patient is a 78 y.o. female presenting with vomiting. The history is provided by the patient. No language interpreter was used.  Emesis  Associated symptoms include abdominal pain and diarrhea.   [name] is a 78 y.o. female who presents to the Emergency Department complaining of constant, moderate diarrhea with associated with nausea and vomiting onset 1 day ago. She states that she has abdominal pain due to vomiting. Pt reports that she has taken 2 OTC tablets for diarrhea this AM with no relief. Denies fever, chills, blood in stool and vomit, dysuria and urinary symptoms. Pt reports that she had flu vaccination 6 days ago. Pt has had appendectomy and cholecystectomy. Pt has hx of breast cancer.    Past Medical History  Diagnosis Date  . Hypertension   . Hypercholesteremia   . Coronary artery disease     Past Surgical History  Procedure Date  . Abdominal hysterectomy   . Hernia repair   . Cholecystectomy   . Breast surgery     History reviewed. No pertinent family history.  History  Substance Use Topics  . Smoking status: Current Some Day Smoker  . Smokeless tobacco: Not on file  . Alcohol Use: No    OB History    Grav Para Term Preterm Abortions TAB SAB Ect Mult Living                  Review of Systems  Gastrointestinal: Positive for nausea, vomiting, abdominal pain and diarrhea.  All other systems reviewed and are negative.    Allergies  Ceftin  Home Medications   Current Outpatient Rx  Name Route Sig Dispense Refill  . ACETAMINOPHEN 500 MG PO TABS Oral Take 500 mg by mouth every 6 (six)  hours as needed. For pain     . ATENOLOL 25 MG PO TABS Oral Take 12.5 mg by mouth daily.      Marland Kitchen CALCIUM CARBONATE-VITAMIN D 600-400 MG-UNIT PO TABS Oral Take 1 tablet by mouth daily.      Marland Kitchen ESCITALOPRAM OXALATE 5 MG PO TABS Oral Take 5 mg by mouth daily.      . OMEGA-3 FATTY ACIDS 1000 MG PO CAPS Oral Take 1 g by mouth daily.      Marland Kitchen PANTOPRAZOLE SODIUM 40 MG PO TBEC Oral Take 40 mg by mouth daily.      Marland Kitchen SIMVASTATIN 40 MG PO TABS Oral Take 40 mg by mouth at bedtime.      Marland Kitchen VITAMIN E 200 UNITS PO CAPS Oral Take 200 Units by mouth daily.      Marland Kitchen ZOLPIDEM TARTRATE 10 MG PO TABS Oral Take 5 mg by mouth at bedtime as needed. For sleep      BP 126/67  Pulse 90  Temp 98.1 F (36.7 C) (Oral)  Resp 20  Ht 5\' 5"  (1.651 m)  Wt 140 lb (63.504 kg)  BMI 23.30 kg/m2  SpO2 93%  Physical Exam  Nursing note and vitals reviewed. Constitutional: She is oriented to person, place, and time. She appears well-developed and well-nourished. No distress.  HENT:  Head: Normocephalic and atraumatic.  Eyes:  Conjunctivae normal are normal.  Neck: Normal range of motion. Neck supple.  Cardiovascular: Normal rate, regular rhythm and normal heart sounds.   No murmur heard. Pulmonary/Chest: Effort normal and breath sounds normal. No respiratory distress. She has no wheezes. She has no rales.  Abdominal: Bowel sounds are normal. She exhibits no distension.       Vague tenderness to palpation in all 4 quadrants  Neurological: She is alert and oriented to person, place, and time.  Skin: Skin is warm and dry.  Psychiatric: She has a normal mood and affect. Her behavior is normal.    ED Course  Procedures (including critical care time) DIAGNOSTIC STUDIES: Oxygen Saturation is 93% on room air, normal by my interpretation.    COORDINATION OF CARE: 2:54 PM Discussed ED treatment with pt  3:02 PM Ordered:    .  morphine injection  2 mg Intravenous Once  . ondansetron (ZOFRAN) IV  4 mg Intravenous Once  .  DISCONTD: ketorolac  30 mg Intravenous Once       Labs Reviewed - No data to display No results found.   No diagnosis found.    MDM  The patient presents here with v/d that sounds viral in nature.  The labs are reassuring, but to exclude other pathology such as colitis, a ct scan was obtained.  This was negative for acute process.  She was hydrated and medicated and is feeling much better.  At this point, she will be discharged to home, to return prn if she worsens.        I personally performed the services described in this documentation, which was scribed in my presence. The recorded information has been reviewed and considered.      Geoffery Lyons, MD 11/02/11 [phone]

## 2011-11-04 ENCOUNTER — Encounter (HOSPITAL_COMMUNITY): Payer: Self-pay | Admitting: *Deleted

## 2011-11-04 ENCOUNTER — Emergency Department (HOSPITAL_COMMUNITY): Payer: Medicare Other

## 2011-11-04 ENCOUNTER — Inpatient Hospital Stay (HOSPITAL_COMMUNITY)
Admission: EM | Admit: 2011-11-04 | Discharge: 2011-11-09 | DRG: 373 | Disposition: A | Discharge status: Home Health | Payer: Medicare Other | Attending: Pulmonary Disease | Admitting: Pulmonary Disease

## 2011-11-04 DIAGNOSIS — Z9221 Personal history of antineoplastic chemotherapy: Secondary | ICD-10-CM

## 2011-11-04 DIAGNOSIS — Z853 Personal history of malignant neoplasm of breast: Secondary | ICD-10-CM

## 2011-11-04 DIAGNOSIS — Z9861 Coronary angioplasty status: Secondary | ICD-10-CM

## 2011-11-04 DIAGNOSIS — E7800 Pure hypercholesterolemia, unspecified: Secondary | ICD-10-CM | POA: Diagnosis present

## 2011-11-04 DIAGNOSIS — Z79899 Other long term (current) drug therapy: Secondary | ICD-10-CM

## 2011-11-04 DIAGNOSIS — R0902 Hypoxemia: Secondary | ICD-10-CM | POA: Diagnosis not present

## 2011-11-04 DIAGNOSIS — F411 Generalized anxiety disorder: Secondary | ICD-10-CM | POA: Diagnosis present

## 2011-11-04 DIAGNOSIS — F329 Major depressive disorder, single episode, unspecified: Secondary | ICD-10-CM | POA: Diagnosis present

## 2011-11-04 DIAGNOSIS — F3289 Other specified depressive episodes: Secondary | ICD-10-CM | POA: Diagnosis present

## 2011-11-04 DIAGNOSIS — J4489 Other specified chronic obstructive pulmonary disease: Secondary | ICD-10-CM | POA: Diagnosis present

## 2011-11-04 DIAGNOSIS — R000 Tachycardia, unspecified: Secondary | ICD-10-CM | POA: Diagnosis present

## 2011-11-04 DIAGNOSIS — I2510 Atherosclerotic heart disease of native coronary artery without angina pectoris: Secondary | ICD-10-CM | POA: Diagnosis present

## 2011-11-04 DIAGNOSIS — J449 Chronic obstructive pulmonary disease, unspecified: Secondary | ICD-10-CM | POA: Diagnosis present

## 2011-11-04 DIAGNOSIS — R197 Diarrhea, unspecified: Secondary | ICD-10-CM

## 2011-11-04 DIAGNOSIS — Z803 Family history of malignant neoplasm of breast: Secondary | ICD-10-CM

## 2011-11-04 DIAGNOSIS — E876 Hypokalemia: Secondary | ICD-10-CM | POA: Diagnosis present

## 2011-11-04 DIAGNOSIS — IMO0002 Reserved for concepts with insufficient information to code with codable children: Secondary | ICD-10-CM

## 2011-11-04 DIAGNOSIS — E785 Hyperlipidemia, unspecified: Secondary | ICD-10-CM | POA: Diagnosis present

## 2011-11-04 DIAGNOSIS — E860 Dehydration: Secondary | ICD-10-CM | POA: Diagnosis present

## 2011-11-04 DIAGNOSIS — A0472 Enterocolitis due to Clostridium difficile, not specified as recurrent: Principal | ICD-10-CM | POA: Diagnosis present

## 2011-11-04 DIAGNOSIS — Z9010 Acquired absence of unspecified breast and nipple: Secondary | ICD-10-CM

## 2011-11-04 DIAGNOSIS — F419 Anxiety disorder, unspecified: Secondary | ICD-10-CM | POA: Diagnosis present

## 2011-11-04 DIAGNOSIS — F17200 Nicotine dependence, unspecified, uncomplicated: Secondary | ICD-10-CM | POA: Diagnosis present

## 2011-11-04 DIAGNOSIS — Z90710 Acquired absence of both cervix and uterus: Secondary | ICD-10-CM

## 2011-11-04 DIAGNOSIS — I10 Essential (primary) hypertension: Secondary | ICD-10-CM | POA: Diagnosis present

## 2011-11-04 HISTORY — DX: Depression, unspecified: F32.A

## 2011-11-04 HISTORY — DX: Major depressive disorder, single episode, unspecified: F32.9

## 2011-11-04 HISTORY — DX: Anxiety disorder, unspecified: F41.9

## 2011-11-04 HISTORY — DX: Other specified diseases of biliary tract: K83.8

## 2011-11-04 HISTORY — DX: Aneurysm of unspecified site: I72.9

## 2011-11-04 HISTORY — DX: Chronic obstructive pulmonary disease, unspecified: J44.9

## 2011-11-04 HISTORY — DX: Malignant neoplasm of unspecified site of unspecified female breast: C50.919

## 2011-11-04 HISTORY — DX: Vascular disorder of intestine, unspecified: K55.9

## 2011-11-04 LAB — URINALYSIS, ROUTINE W REFLEX MICROSCOPIC
Bilirubin Urine: NEGATIVE
Leukocytes, UA: NEGATIVE
Nitrite: NEGATIVE
Specific Gravity, Urine: 1.02 (ref 1.005–1.030)
Urobilinogen, UA: 0.2 mg/dL (ref 0.0–1.0)
pH: 6 (ref 5.0–8.0)

## 2011-11-04 LAB — COMPREHENSIVE METABOLIC PANEL
BUN: 13 mg/dL (ref 6–23)
CO2: 29 mEq/L (ref 19–32)
Calcium: 9.7 mg/dL (ref 8.4–10.5)
Creatinine, Ser: 0.65 mg/dL (ref 0.50–1.10)
GFR calc Af Amer: >90 mL/min (ref >90)
GFR calc non Af Amer: 79 mL/min — ABNORMAL LOW (ref >90)
Glucose, Bld: 114 mg/dL — ABNORMAL HIGH (ref 70–99)
Sodium: 139 mEq/L (ref 135–145)
Total Protein: 7.7 g/dL (ref 6.0–8.3)

## 2011-11-04 LAB — CBC WITH DIFFERENTIAL/PLATELET
Eosinophils Absolute: 0 10*3/uL (ref 0.0–0.7)
Eosinophils Relative: 1 % (ref 0–5)
HCT: 47.8 % — ABNORMAL HIGH (ref 36.0–46.0)
Lymphocytes Relative: 12 % (ref 12–46)
Lymphs Abs: 0.4 10*3/uL — ABNORMAL LOW (ref 0.7–4.0)
MCH: 33.3 pg (ref 26.0–34.0)
MCV: 98.4 fL (ref 78.0–100.0)
Monocytes Absolute: 0 10*3/uL — ABNORMAL LOW (ref 0.1–1.0)
Platelets: 101 10*3/uL — ABNORMAL LOW (ref 150–400)
RBC: 4.86 MIL/uL (ref 3.87–5.11)
WBC: 3.5 10*3/uL — ABNORMAL LOW (ref 4.0–10.5)

## 2011-11-04 LAB — URINE MICROSCOPIC-ADD ON

## 2011-11-04 LAB — LIPASE, BLOOD: Lipase: 27 U/L (ref 11–59)

## 2011-11-04 MED ORDER — SIMVASTATIN 20 MG PO TABS
40.0000 mg | ORAL_TABLET | Freq: Every day | ORAL | Status: DC
  Administered 2011-11-04 – 2011-11-08 (×5): 40 mg via ORAL
  Filled 2011-11-04 (×5): qty 2

## 2011-11-04 MED ORDER — ALBUTEROL SULFATE HFA 108 (90 BASE) MCG/ACT IN AERS: 2.0000 | INHALATION_SPRAY | RESPIRATORY_TRACT | Status: DC | PRN

## 2011-11-04 MED ORDER — ONDANSETRON HCL 4 MG/2ML IJ SOLN
4.0000 mg | Freq: Four times a day (QID) | INTRAMUSCULAR | Status: DC | PRN
  Administered 2011-11-07: 4 mg via INTRAVENOUS
  Filled 2011-11-04: qty 2

## 2011-11-04 MED ORDER — ESCITALOPRAM OXALATE 10 MG PO TABS
5.0000 mg | ORAL_TABLET | Freq: Every day | ORAL | Status: DC
  Administered 2011-11-05 – 2011-11-09 (×5): 5 mg via ORAL
  Filled 2011-11-04 (×5): qty 1

## 2011-11-04 MED ORDER — ACETAMINOPHEN 650 MG RE SUPP: 650.0000 mg | Freq: Four times a day (QID) | RECTAL | Status: DC | PRN

## 2011-11-04 MED ORDER — ENOXAPARIN SODIUM 40 MG/0.4ML ~~LOC~~ SOLN
40.0000 mg | SUBCUTANEOUS | Status: DC
  Administered 2011-11-04 – 2011-11-05 (×2): 40 mg via SUBCUTANEOUS
  Filled 2011-11-04 (×2): qty 0.4

## 2011-11-04 MED ORDER — ACETAMINOPHEN 325 MG PO TABS
650.0000 mg | ORAL_TABLET | Freq: Four times a day (QID) | ORAL | Status: DC | PRN
  Administered 2011-11-05 – 2011-11-07 (×2): 650 mg via ORAL
  Filled 2011-11-04 (×2): qty 2

## 2011-11-04 MED ORDER — DIPHENOXYLATE-ATROPINE 2.5-0.025 MG PO TABS: 1.0000 | ORAL_TABLET | Freq: Four times a day (QID) | ORAL | Status: DC | PRN

## 2011-11-04 MED ORDER — FAMOTIDINE IN NACL 20-0.9 MG/50ML-% IV SOLN
20.0000 mg | Freq: Once | INTRAVENOUS | Status: AC
  Administered 2011-11-04: 20 mg via INTRAVENOUS
  Filled 2011-11-04: qty 50

## 2011-11-04 MED ORDER — HYDROCODONE-ACETAMINOPHEN 5-325 MG PO TABS
1.0000 | ORAL_TABLET | ORAL | Status: DC | PRN
  Filled 2011-11-04: qty 1

## 2011-11-04 MED ORDER — ONDANSETRON HCL 4 MG/2ML IJ SOLN
4.0000 mg | Freq: Once | INTRAMUSCULAR | Status: AC
  Administered 2011-11-04: 4 mg via INTRAVENOUS
  Filled 2011-11-04: qty 2

## 2011-11-04 MED ORDER — ZOLPIDEM TARTRATE 5 MG PO TABS
5.0000 mg | ORAL_TABLET | Freq: Every evening | ORAL | Status: DC | PRN
  Administered 2011-11-06 – 2011-11-08 (×3): 5 mg via ORAL
  Filled 2011-11-04 (×3): qty 1

## 2011-11-04 MED ORDER — ACETAMINOPHEN 325 MG RE SUPP
RECTAL | Status: AC
  Filled 2011-11-04: qty 1

## 2011-11-04 MED ORDER — ATENOLOL 25 MG PO TABS
12.5000 mg | ORAL_TABLET | Freq: Every day | ORAL | Status: DC
  Administered 2011-11-06 – 2011-11-09 (×4): 12.5 mg via ORAL
  Filled 2011-11-04 (×5): qty 1

## 2011-11-04 MED ORDER — ACETAMINOPHEN 650 MG RE SUPP
RECTAL | Status: AC
  Administered 2011-11-04: 650 mg via RECTAL
  Filled 2011-11-04: qty 1

## 2011-11-04 MED ORDER — SODIUM CHLORIDE 0.9 % IV SOLN
Freq: Once | INTRAVENOUS | Status: AC
  Administered 2011-11-04: 17:00:00 via INTRAVENOUS

## 2011-11-04 MED ORDER — ACETAMINOPHEN 650 MG RE SUPP
650.0000 mg | Freq: Once | RECTAL | Status: AC
  Administered 2011-11-04: 650 mg via RECTAL

## 2011-11-04 MED ORDER — ONDANSETRON HCL 4 MG PO TABS
4.0000 mg | ORAL_TABLET | Freq: Four times a day (QID) | ORAL | Status: DC | PRN
  Administered 2011-11-05 – 2011-11-09 (×2): 4 mg via ORAL
  Filled 2011-11-04 (×2): qty 1

## 2011-11-04 MED ORDER — POTASSIUM CHLORIDE IN NACL 20-0.9 MEQ/L-% IV SOLN
INTRAVENOUS | Status: DC
  Administered 2011-11-04 – 2011-11-06 (×4): via INTRAVENOUS

## 2011-11-04 NOTE — ED Provider Notes (Signed)
See prior note   Ward Givens, MD 11/04/11 2595

## 2011-11-04 NOTE — ED Notes (Signed)
NVD since Sunday, seen here for same, but no better.

## 2011-11-04 NOTE — ED Notes (Signed)
[name] [phone]

## 2011-11-04 NOTE — ED Provider Notes (Cosign Needed)
This chart was scribed for Ward Givens, MD, MD by Smitty Pluck. The patient was seen in room APA07 and the patient's care was started at 4:37PM.  Patient and her son reports she has been having nausea without vomiting but also having profuse diarrhea. Patient was seen in the ED a couple might to go however she has not done well since she was discharged.  Pt has grayish-pale lips and is pale and appears SOB.   Medical screening examination/treatment/procedure(s) were conducted as a shared visit with non-physician practitioner(s) and myself.  I personally evaluated the patient during the encounter Devoria Albe, MD, Franz Dell, MD 11/04/11 [phone]

## 2011-11-04 NOTE — H&P (Signed)
[name], [name]                  ACCOUNT NO.:  [id number]  MEDICAL RECORD NO.:  [id number]  LOCATION:  A310                          FACILITY:  [facility]  PHYSICIAN:  Vidal Lampkins L. Juanetta Gosling, M.D.DATE OF BIRTH:  [birth year]  DATE OF ADMISSION:  11/04/2011 DATE OF DISCHARGE:  LH                             HISTORY & PHYSICAL   REASON FOR ADMISSION:  Gastroenteritis not responsive to outpatient treatment.  HISTORY:  [name] is an 78 year old, who was in the emergency room about 2 days ago with what was thought to be gastroenteritis.  He was treated with fluids given, antiemetics, but has continued having nausea and vomiting.  She has not been able to keep anything down and she continues to have diarrhea.  She continues to have cramping abdominal pain.  She has not been having any fever or chills, chest pain and has not had any bloody diarrhea.  PAST MEDICAL HISTORY:  Positive for COPD.  She has a cholecystectomy, appendectomy, abdominal hysterectomy, and history of anxiety and depression.  SOCIAL HISTORY:  She lives alone.  She has family nearby.  She is an ex- cigarette smoker.  She does not use any alcohol.  PAST MEDICAL HISTORY:  Positive for hypertension, hyperlipidemia, coronary artery disease, as noted.  PAST SURGICAL HISTORY:  She has had a hysterectomy, hernia repair, cholecystectomy, and a breast biopsy.  REVIEW OF SYSTEMS:  Except as mentioned is essentially negative.  MEDICATIONS AT HOME: 1. Albuterol inhaler as needed. 2. Atenolol 25 mg one half at bedtime. 3. Calcium carbonate with vitamin D daily. 4. Lomotil that she has been taking recently for her diarrhea. 5. Lexapro 5 mg daily. 6. Protonix 40 mg daily. 7. Simvastatin 40 mg. 8. Vitamin E 200 units daily. 9. Zolpidem 5 mg at bedtime as needed.  PHYSICAL EXAMINATION:  GENERAL:  She appears to be acutely ill.  She looks dehydrated.  Her pulse is in the 120s. VITAL SIGNS:  Blood pressure 120/70. HEENT:  Her  pupils are reactive.  Her nose and throat are clear. NECK:  Supple without masses. ABDOMEN:  She is mildly tender in the abdomen.  ASSESSMENT:  Then she has a gastroenteritis.  She had a CT on Monday.  I am not going to be repeat it now.  She has coronary disease which is stable.  She appears to be markedly dehydrated.  My plan then is to have her take medications as above with IV fluids, antiemetics, medications for diarrhea and then depending on how she responds, she may need a repeat CT for GI consult for above.     Daxten Kovalenko L. Juanetta Gosling, M.D.     ELH/MEDQ  D:  11/04/2011  T:  11/04/2011  Job:  [id number]

## 2011-11-04 NOTE — ED Provider Notes (Signed)
History     CSN: [id number]  Arrival date & time 11/04/11  1555   First MD Initiated Contact with Patient 11/04/11 1607      Chief Complaint  Patient presents with  . Emesis    (Consider location/radiation/quality/duration/timing/severity/associated sxs/prior treatment) HPI Comments: Patient c/o persistent vomiting and diarrhea for 3 days.  She was seen here Monday 11/02/11 for similar symptoms and returns today stating that the symptoms have not improved and she is not able to keep down any fluids or foods for 3 days.  Also has crampy abdominal pain just before vomiting.  Family members states that she lives alone and has not been trying to eat or drink.  She denies bloody stools, hematemesis, chest pain, shortness of breath or fever.  No recent abdominal surgeries  Patient does report hx of COPD, previous cholecystectomy, appendectomy, and abdominal hysterectomy.  Patient is a 78 y.o. female presenting with vomiting. The history is provided by the patient (patient's son).  Emesis  This is a recurrent problem. The current episode started more than 2 days ago. The problem occurs 5 to 10 times per day. The problem has been gradually worsening. The emesis has an appearance of stomach contents. There has been no fever. Associated symptoms include abdominal pain and diarrhea. Pertinent negatives include no arthralgias, no chills, no cough, no fever, no headaches, no myalgias, no sweats and no URI.    Past Medical History  Diagnosis Date  . Hypertension   . Hypercholesteremia   . Coronary artery disease     Past Surgical History  Procedure Date  . Abdominal hysterectomy   . Hernia repair   . Cholecystectomy   . Breast surgery     History reviewed. No pertinent family history.  History  Substance Use Topics  . Smoking status: Current Some Day Smoker  . Smokeless tobacco: Not on file  . Alcohol Use: No    OB History    Grav Para Term Preterm Abortions TAB SAB Ect Mult  Living                  Review of Systems  Constitutional: Positive for appetite change. Negative for fever, chills and activity change.  HENT: Negative for sore throat and neck pain.   Respiratory: Negative for cough, chest tightness and wheezing.   Cardiovascular: Positive for palpitations. Negative for chest pain and leg swelling.  Gastrointestinal: Positive for nausea, vomiting, abdominal pain and diarrhea. Negative for blood in stool and abdominal distention.  Genitourinary: Negative for flank pain and difficulty urinating.  Musculoskeletal: Negative for myalgias and arthralgias.  Skin: Negative for color change and rash.  Neurological: Negative for dizziness, syncope, weakness, numbness and headaches.  Psychiatric/Behavioral: Negative for confusion.  All other systems reviewed and are negative.    Allergies  Ceftin and Iohexol  Home Medications   Current Outpatient Rx  Name Route Sig Dispense Refill  . ACETAMINOPHEN 500 MG PO TABS Oral Take 500 mg by mouth every 6 (six) hours as needed. For pain     . ALBUTEROL SULFATE HFA 108 (90 BASE) MCG/ACT IN AERS Inhalation Inhale 2 puffs into the lungs 2 (two) times daily as needed. Shortness of Breath    . ATENOLOL 25 MG PO TABS Oral Take 12.5 mg by mouth daily.      Marland Kitchen CALCIUM CARBONATE-VITAMIN D 600-400 MG-UNIT PO TABS Oral Take 1 tablet by mouth daily.      Marland Kitchen DIPHENOXYLATE-ATROPINE 2.5-0.025 MG PO TABS Oral Take 1 tablet by  mouth 4 (four) times daily as needed for diarrhea or loose stools. 10 tablet 0  . ESCITALOPRAM OXALATE 5 MG PO TABS Oral Take 5 mg by mouth daily.      Marland Kitchen ONDANSETRON 4 MG PO TBDP  4mg  ODT q4 hours prn nausea/vomit 4 tablet 1  . PANTOPRAZOLE SODIUM 40 MG PO TBEC Oral Take 40 mg by mouth daily.      Marland Kitchen SIMVASTATIN 40 MG PO TABS Oral Take 40 mg by mouth at bedtime.      Marland Kitchen VITAMIN E 200 UNITS PO CAPS Oral Take 200 Units by mouth daily.      Marland Kitchen ZOLPIDEM TARTRATE 10 MG PO TABS Oral Take 5 mg by mouth at bedtime as  needed. For sleep      BP 124/50  Pulse 139  Temp 98.1 F (36.7 C) (Oral)  Resp 22  Ht 5\' 5"  (1.651 m)  Wt 120 lb (54.432 kg)  BMI 19.97 kg/m2  SpO2 91%  Physical Exam  Nursing note and vitals reviewed. Constitutional: She is oriented to person, place, and time. She is cooperative. She has a sickly appearance.  HENT:  Head: Normocephalic and atraumatic.  Right Ear: Tympanic membrane and ear canal normal.  Left Ear: Tympanic membrane and ear canal normal.  Mouth/Throat: Uvula is midline. Mucous membranes are dry. No uvula swelling.       Mucous membranes are very dry.  Lips are grayish in color.  Skin tenting is present  Eyes: EOM are normal. Pupils are equal, round, and reactive to light.  Neck: Normal range of motion. Neck supple.  Cardiovascular: Normal heart sounds and intact distal pulses.  Tachycardia present.   No murmur heard. Pulmonary/Chest: Effort normal and breath sounds normal.  Abdominal: Soft. Bowel sounds are normal. She exhibits no distension and no mass. There is no hepatosplenomegaly. There is generalized tenderness. There is no rigidity, no rebound, no guarding, no CVA tenderness and no tenderness at McBurney's point.       Mild generalized ttp of the 4 quadrants.  No guarding or rebound tenderness.    Musculoskeletal: Normal range of motion. She exhibits no edema and no tenderness.  Lymphadenopathy:    She has no cervical adenopathy.  Neurological: She is alert and oriented to person, place, and time. She exhibits normal muscle tone. Coordination normal.  Skin: Skin is warm and dry. No rash noted.    ED Course  Procedures (including critical care time)  Labs Reviewed  CBC WITH DIFFERENTIAL - Abnormal; Notable for the following:    WBC 3.5 (*)     Hemoglobin 16.2 (*)     HCT 47.8 (*)     Platelets 101 (*)     Neutrophils Relative 87 (*)     Lymphs Abs 0.4 (*)     Monocytes Relative 0 (*)     Monocytes Absolute 0.0 (*)     All other components within  normal limits  COMPREHENSIVE METABOLIC PANEL - Abnormal; Notable for the following:    Potassium 3.3 (*)     Glucose, Bld 114 (*)     GFR calc non Af Amer 79 (*)     All other components within normal limits  LIPASE, BLOOD  URINALYSIS, ROUTINE W REFLEX MICROSCOPIC        MDM   Previous ED chart , labs and imaging were reviewed by me.     Patient appears very dehydrated.  No active vomiting.  abd is soft, no guarding or rebound tenderness.  Will consult internal medicine for admission   Consulted Dr. Juanetta Gosling will see pt in the ED and write admit orders.  Temp admit orders were offered.   Tin Engram L. [place], [place] 11/04/11 1825

## 2011-11-04 NOTE — ED Notes (Signed)
Attempted to call report to 3rd floor nurse advised she would call back in a few minutes

## 2011-11-05 ENCOUNTER — Encounter (HOSPITAL_COMMUNITY): Payer: Self-pay | Admitting: Gastroenterology

## 2011-11-05 DIAGNOSIS — R112 Nausea with vomiting, unspecified: Secondary | ICD-10-CM

## 2011-11-05 DIAGNOSIS — D72829 Elevated white blood cell count, unspecified: Secondary | ICD-10-CM

## 2011-11-05 DIAGNOSIS — R197 Diarrhea, unspecified: Secondary | ICD-10-CM

## 2011-11-05 LAB — BASIC METABOLIC PANEL
BUN: 13 mg/dL (ref 6–23)
CO2: 25 mEq/L (ref 19–32)
Glucose, Bld: 111 mg/dL — ABNORMAL HIGH (ref 70–99)
Potassium: 3.3 mEq/L — ABNORMAL LOW (ref 3.5–5.1)
Sodium: 141 mEq/L (ref 135–145)

## 2011-11-05 LAB — CBC
Hemoglobin: 12.8 g/dL (ref 12.0–15.0)
MCH: 32.1 pg (ref 26.0–34.0)
MCHC: 32.4 g/dL (ref 30.0–36.0)
MCV: 99 fL (ref 78.0–100.0)
RBC: 3.99 MIL/uL (ref 3.87–5.11)

## 2011-11-05 MED ORDER — VANCOMYCIN 50 MG/ML ORAL SOLUTION
125.0000 mg | Freq: Four times a day (QID) | ORAL | Status: DC
  Administered 2011-11-05 – 2011-11-09 (×17): 125 mg via ORAL
  Filled 2011-11-05 (×26): qty 2.5

## 2011-11-05 NOTE — Care Management Note (Addendum)
    Page 1 of 2   11/09/2011     2:12:41 PM   CARE MANAGEMENT NOTE 11/09/2011  Patient:  [name], [name]   Account Number:  [id number]  Date Initiated:  11/05/2011  Documentation initiated by:  Sharrie Rothman  Subjective/Objective Assessment:   Pt admitted from home with gastroenteritis. Pt lives alone but has several friends and neighbors that live close by check on her. Her children live out of town. Pt is independent with ADL's.     Action/Plan:   Will continue to follow for [facility] or CM needs.   Anticipated DC Date:  11/06/2011   Anticipated DC Plan:  HOME/SELF CARE      DC Planning Services  CM consult      [facility] Choice  HOME HEALTH   Choice offered to / List presented to:  C-1 Patient   DME arranged  [facility]      DME agency  [facility]     HH arranged  HH-1 RN  HH-2 PT      [facility] agency  [facility]   Status of service:  Completed, signed off Medicare Important Message given?  YES (If response is "NO", the following Medicare IM given date fields will be blank) Date Medicare IM given:  11/06/2011 Date Additional Medicare IM given:  11/09/2011  Discharge Disposition:  HOME W HOME HEALTH SERVICES  Per UR Regulation:    If discussed at Long Length of Stay Meetings, dates discussed:    Comments:  11/09/11 1410 Arlyss Queen, RN BSN CM Pt discharged home today with [facility] Rn and PT and home O2. [name] of [facility] is aware and will collect the pts information from the chart. Portable O2 will be delivered to pts room by [facility] prior to discharge and concentrator will be delivered to pts home after discharge. No other DME needs noted. Pt and pts nurse aware of discharge arrangements.  11/06/11 1255 Arlyss Queen, RN BSN CM Pt potential discharge on 11/07/11.  Pt chose [facility] for RN. [name] of [facility] is aware and will collect the pts information from the chart. No DME needs noted. HH services will start within 48 hours of discharge. Pt and pts  nurse aware of discharge arrangements.  11/05/11 1441 Arlyss Queen, RN BSN CM

## 2011-11-05 NOTE — Progress Notes (Signed)
Received call from [facility] in Lab that patient had a C.diff + PCR.  Reported to staff nurse, Armanda Magic, RN. [place], Candelaria Stagers 11/05/2011

## 2011-11-05 NOTE — Consult Note (Signed)
Referring Provider: Fredirick Maudlin, MD Primary Care Physician:  Fredirick Maudlin, MD Primary Gastroenterologist:  Jonette Eva, MD  Reason for Consultation:  N/V/D  HPI: [name] is a 78 y.o. female admitted with ongoing N/V/D which started Sunday 11/01/11. She was seen in ED on 11/02/11 and overall labs and CT looked okay. She was given supportive treatment and sent home. She continues to have N/V/D therefore came back to ED yesterday and was admitted. This AM her WBC is 18,200. Was 3500 yesterday. Platelet count appears chronically low, currently 108,000. C.Diff PCR pending, specimen submitted this AM.   She has h/o COPD and according to Dr. Zadie Cleverly patient is on antibiotics frequently. Patient believes her last antibiotics were over 10 weeks ago and were Z-pak. She denies ill contacts. She does not frequent nursing homes. Denies suspicious food items or fever. Usually without abdominal pain, vomiting, or bowel issues. Denies melena, brbpr. Has been having 6-7 loose stools per day since Sunday. Three BMs today so far. Last vomiting was last night. Kept down some jello since then. Appetite poor. C/O diffuse abdominal crampy pain. Denies heartburn on daily protonix.    Prior to Admission medications   Medication Sig Start Date End Date Taking? Authorizing Provider  acetaminophen (TYLENOL) 500 MG tablet Take 500 mg by mouth every 6 (six) hours as needed. For pain     Historical Provider, MD  albuterol (PROVENTIL HFA;VENTOLIN HFA) 108 (90 BASE) MCG/ACT inhaler Inhale 2 puffs into the lungs 2 (two) times daily as needed. Shortness of Breath    Historical Provider, MD  atenolol (TENORMIN) 25 MG tablet Take 12.5 mg by mouth daily.      Historical Provider, MD  Calcium Carbonate-Vitamin D (CALTRATE 600+D) 600-400 MG-UNIT per tablet Take 1 tablet by mouth daily.      Historical Provider, MD  diphenoxylate-atropine (LOMOTIL) 2.5-0.025 MG per tablet Take 1 tablet by mouth 4 (four) times daily as  needed for diarrhea or loose stools. 11/02/11   Geoffery Lyons, MD  escitalopram (LEXAPRO) 5 MG tablet Take 5 mg by mouth daily.      Historical Provider, MD  ondansetron (ZOFRAN ODT) 4 MG disintegrating tablet 4mg  ODT q4 hours prn nausea/vomit 11/02/11   Geoffery Lyons, MD  pantoprazole (PROTONIX) 40 MG tablet Take 40 mg by mouth daily.      Historical Provider, MD  simvastatin (ZOCOR) 40 MG tablet Take 40 mg by mouth at bedtime.      Historical Provider, MD  vitamin E 200 UNIT capsule Take 200 Units by mouth daily.      Historical Provider, MD  zolpidem (AMBIEN) 10 MG tablet Take 5 mg by mouth at bedtime as needed. For sleep    Historical Provider, MD    Current Facility-Administered Medications  Medication Dose Route Frequency Provider Last Rate Last Dose  . 0.9 %  sodium chloride infusion   Intravenous Once Tammy L. Triplett, PA 999 mL/hr at 11/04/11 1655    . 0.9 % NaCl with KCl 20 mEq/ L  infusion   Intravenous Continuous Fredirick Maudlin, MD 125 mL/hr at 11/04/11 2045    . acetaminophen (TYLENOL) suppository 650 mg  650 mg Rectal Once Tammy L. Triplett, PA   650 mg at 11/04/11 1659  . acetaminophen (TYLENOL) tablet 650 mg  650 mg Oral Q6H PRN Fredirick Maudlin, MD       Or  . acetaminophen (TYLENOL) suppository 650 mg  650 mg Rectal Q6H PRN Fredirick Maudlin, MD      .  albuterol (PROVENTIL HFA;VENTOLIN HFA) 108 (90 BASE) MCG/ACT inhaler 2 puff  2 puff Inhalation Q4H PRN Fredirick Maudlin, MD      . atenolol (TENORMIN) tablet 12.5 mg  12.5 mg Oral Daily Fredirick Maudlin, MD      . diphenoxylate-atropine (LOMOTIL) 2.5-0.025 MG per tablet 1 tablet  1 tablet Oral QID PRN Fredirick Maudlin, MD      . enoxaparin (LOVENOX) injection 40 mg  40 mg Subcutaneous Q24H Fredirick Maudlin, MD   40 mg at 11/04/11 2152  . escitalopram (LEXAPRO) tablet 5 mg  5 mg Oral Daily Fredirick Maudlin, MD      . famotidine (PEPCID) IVPB 20 mg  20 mg Intravenous Once Tammy L. Triplett, PA   20 mg at 11/04/11 1715  .  HYDROcodone-acetaminophen (NORCO/VICODIN) 5-325 MG per tablet 1-2 tablet  1-2 tablet Oral Q4H PRN Fredirick Maudlin, MD      . ondansetron [facility]) injection 4 mg  4 mg Intravenous Once Tammy L. Triplett, PA   4 mg at 11/04/11 1712  . ondansetron (ZOFRAN) tablet 4 mg  4 mg Oral Q6H PRN Fredirick Maudlin, MD       Or  . ondansetron [facility]) injection 4 mg  4 mg Intravenous Q6H PRN Fredirick Maudlin, MD      . simvastatin (ZOCOR) tablet 40 mg  40 mg Oral QHS Fredirick Maudlin, MD   40 mg at 11/04/11 2153  . vancomycin (VANCOCIN) 50 mg/mL oral solution 125 mg  125 mg Oral QID Tiffany Kocher, PA      . zolpidem [facility]) tablet 5 mg  5 mg Oral QHS PRN Fredirick Maudlin, MD      . DISCONTD: acetaminophen (TYLENOL) 325 MG suppository             Allergies as of 11/04/2011 - Review Complete 11/04/2011  Allergen Reaction Noted  . Ceftin (cefuroxime axetil) Anaphylaxis 11/02/2011  . Iohexol Hives and Shortness Of Breath 11/02/2011    Past Medical History  Diagnosis Date  . Hypertension   . Hypercholesteremia   . Coronary artery disease     2 stents per patient  . COPD (chronic obstructive pulmonary disease)   . Anxiety   . Depression   . Breast cancer 2003    treated with surgery and Arimadex  . Ischemic colitis 2009  . Dilated bile duct     noted on CT 2009, ampulla looked normal on EGD  . Aneurysm     thoracic aorta    Past Surgical History  Procedure Date  . Abdominal hysterectomy   . Hernia repair   . Cholecystectomy   . Breast surgery     left partial mastectomy 04/15/11, wide excision left breast  04/21/11, left partial mastectomy 12/19/02  . Appendectomy   . Colonoscopy 05/2007    Dr. Magnus Ivan, ulcerated, edematous area at 35-40cm from anal verge bx c/w ischemic colitis, frequent sigm tics, small internal hemrrhoids. SMA/IMA wide open on CT at that time.   . Esophagogastroduodenoscopy 05/2007    Dr. Gae Bon ring, 2-3 cm HH, streaky erythema antrum with  superficial erosions, bx gastritis without H.Pylori    Family History  Problem Relation Age of Onset  . Breast cancer Daughter   . Breast cancer Mother   . Breast cancer Sister   . Colon cancer Neg Hx     History   Social History  . Marital Status: Widowed    Spouse Name: N/A  Number of Children: 3  . Years of Education: N/A   Occupational History  . Not on file.   Social History Main Topics  . Smoking status: Current Some Day Smoker  . Smokeless tobacco: Not on file  . Alcohol Use: No  . Drug Use: No  . Sexually Active: Yes    Birth Control/ Protection: Post-menopausal, Surgical   Other Topics Concern  . Not on file   Social History Narrative  . No narrative on file     ROS:  General: Acute onset anorexia, fatigue, weakness. Low-grade temp this admission.  Eyes: Negative for vision changes.  ENT: Negative for hoarseness, difficulty swallowing , nasal congestion. CV: Negative for chest pain, angina, palpitations, dyspnea on exertion, peripheral edema.  Respiratory: Negative for dyspnea at rest, cough, sputum, wheezing. Chronic doe. GI: See history of present illness. GU:  Negative for dysuria, hematuria, urinary incontinence, urinary frequency, nocturnal urination.  MS: Negative for joint pain, low back pain.  Derm: Negative for rash or itching.  Neuro: Negative for weakness, abnormal sensation, seizure, frequent headaches, memory loss, confusion.  Psych: Negative for anxiety, depression, suicidal ideation, hallucinations.  Endo: Negative for unusual weight change.  Heme: Negative for bruising or bleeding. Allergy: Negative for rash or hives.       Physical Examination: Vital signs in last 24 hours: Temp:  [98 F (36.7 C)-100.3 F (37.9 C)] 98 F (36.7 C) (10/24 0421) Pulse Rate:  [81-139] 81  (10/24 0421) Resp:  [20-35] 20  (10/24 0421) BP: (85-152)/(41-74) 99/62 mmHg (10/24 0421) SpO2:  [90 %-99 %] 99 % (10/24 0421) Weight:  [120 lb (54.432 kg)-139  lb 15.9 oz (63.5 kg)] 139 lb 15.9 oz (63.5 kg) (10/24 0421) Last BM Date: 11/05/11  General: Elderly WF appears uncomfortable but in no acute distress.  Head: Normocephalic, atraumatic.   Eyes: Conjunctiva pink, no icterus. Mouth: Oropharyngeal mucosa moist and pink , no lesions erythema or exudate. Neck: Supple without thyromegaly, masses, or lymphadenopathy.  Lungs: Clear to auscultation bilaterally.  Heart: Regular rate and rhythm, no murmurs rubs or gallops.  Abdomen: Bowel sounds are hypoactive, diffuse abd tenderness (mild), nondistended, no hepatosplenomegaly or masses, no abdominal bruits or    hernia , no rebound or guarding.   Rectal: not performed Extremities: No lower extremity edema, clubbing, deformity.  Neuro: Alert and oriented x 4 , grossly normal neurologically.  Skin: Warm and dry, no rash or jaundice.   Psych: Alert and cooperative, normal mood and affect.        Intake/Output from previous day: 10/23 0701 - 10/24 0700 In: 1150 [I.V.:1150] Out: 250 [Urine:250] Intake/Output this shift:    Lab Results: CBC  Basename 11/05/11 0446 11/04/11 1627 11/02/11 1526  WBC 18.2* 3.5* 9.6  HGB 12.8 16.2* 15.7*  HCT 39.5 47.8* 46.8*  MCV 99.0 98.4 97.3  PLT 108* 101* 128*   BMET  Basename 11/05/11 0446 11/04/11 1627 11/02/11 1526  NA 141 139 137  K 3.3* 3.3* 3.7  CL 108 98 101  CO2 25 29 26   GLUCOSE 111* 114* 100*  BUN 13 13 13   CREATININE 0.56 0.65 0.45*  CALCIUM 7.7* 9.7 9.6   LFT  Basename 11/04/11 1627 11/02/11 1526  BILITOT 1.1 0.4  BILIDIR -- --  IBILI -- --  ALKPHOS 89 71  AST 34 20  ALT 35 16  PROT 7.7 7.1  ALBUMIN 3.7 3.7   Lab Results  Component Value Date   LIPASE 27 11/04/2011  Imaging Studies: Ct Abdomen Pelvis Wo Contrast  11/02/2011  *RADIOLOGY REPORT*  Clinical Data: Nausea, vomiting and diarrhea.  Right lower quadrant pain.  CT ABDOMEN AND PELVIS WITHOUT CONTRAST  Technique:  Multidetector CT imaging of the abdomen and  pelvis was performed following the standard protocol without intravenous contrast.  Comparison: 07/28/2008  Findings: There are chronic changes and volume loss at the left lung base.  No evidence for pleural effusions.  There is no evidence for free intraperitoneal air.  There has been enlargement of the descending thoracic aorta which measures up to 4.2 cm.  There are irregular peripheral calcifications involving the descending thoracic aorta which are similar to the prior examination.  The gallbladder has been removed.  No gross abnormality to the liver, spleen, pancreas or adrenal glands.  There is a 1.4 cm low density structure involving the left kidney which has enlarged but most likely represents a cyst.  Two punctate calcifications within the mid pole of the left kidney without hydronephrosis.  There is mild dilatation of the right ureter but no evidence for an obstructing lesion or stone.  Multiple calcifications within the right kidney, predominantly in the mid aspect.  Largest calcification measures up to 0.7 cm.  The uterus has been removed.  There is fluid in the urinary bladder.  There are prominent diverticula involving the sigmoid colon without acute inflammatory changes.  Stable dilatation of the common bile duct measuring up to 1.5 cm.  Small ventral hernia containing fat.  Degenerative disc changes at L4-L5 and L5-S1. There is a disc bulge or herniation at L4-L5.  IMPRESSION: There are bilateral kidney stones or calcifications, right side greater than left.  No evidence for an obstructing lesion or stone.  Enlargement of an exophytic low-density structure in the left kidney.  This structure is indeterminate on this noncontrast but favor a cyst.  The structure is large enough that it could be further evaluated with ultrasound for further evaluation.  Degenerative changes in the lower lumbar spine.  Enlargement and aneurysmal dilatation of the descending thoracic aorta. Descending thoracic aorta  measures up to 4.2 cm.   Original Report Authenticated By: Richarda Overlie, M.D.    Dg Chest Portable 1 View  11/04/2011  *RADIOLOGY REPORT*  Clinical Data:  Diarrhea, vomiting, history hypertension, smoking, coronary artery disease post stenting  PORTABLE CHEST - 1 VIEW  Comparison: Portable exam 1801 hours compared to 10/07/2009  Findings: Enlargement of cardiac silhouette with pulmonary vascular congestion. Atherosclerotic calcification of a tortuous thoracic aorta. Enlargement of right hilum, similar to 07/28/2008, question pulmonary arterial hypertension. Emphysematous and minimal bronchitic changes. Accentuation of interstitial markings perhaps minimally increased since previous exam, cannot exclude mild CHF. Minimal bibasilar atelectasis. Biapical scarring. No gross pleural effusion or pneumothorax.  IMPRESSION: Enlargement of cardiac silhouette with pulmonary vascular congestion and question minimal pulmonary edema. Emphysematous and bronchitic changes. Enlargement of right hilum unchanged since 2010, suspect related to enlargement pulmonary artery question pulmonary arterial hypertension.   Original Report Authenticated By: Lollie Marrow, M.D.   Pierre.Alas week]   Impression: 78 y/o female with acute onset N/V/D which began 5 days ago. Noncontrast CT without acute findings. Patient denies recent antibiotics but usually takes fairly frequent for COPD exacerbations per Dr. Juanetta Gosling. Suspect with significant increase in WBC, we likely are dealing with infectious process (bacterial) and C.Diff needs to be considered first and foremost. Based on protocol, we will place her on oral vancomycin due to WBC >15,000 until results are available.  Plan: 1. Oral vanc 125mg  QID. 2. Follow up on C.diff PCR as available. If negative, then collect stool culture. 3. Continue clear liquids for now. 4. Continue supportive measures.   I would like to thank Dr. Juanetta Gosling for allowing [place] to take part in the care of this nice  lade.    LOS: 1 day   Tana Coast  11/05/2011, 9:40 AM  Cdiff PCR positive; Would continue oral Vancomycin as outlined

## 2011-11-05 NOTE — Progress Notes (Signed)
Subjective: She says she is still having nausea and diarrhea. We now have a stool to do a Clostridium difficile PCR and that we'll be sent. She's having some abdominal cramping. She basically does not feel well.  Objective: Vital signs in last 24 hours: Temp:  [98 F (36.7 C)-100.3 F (37.9 C)] 98 F (36.7 C) (10/24 0421) Pulse Rate:  [81-139] 81  (10/24 0421) Resp:  [20-35] 20  (10/24 0421) BP: (85-152)/(41-74) 99/62 mmHg (10/24 0421) SpO2:  [90 %-99 %] 99 % (10/24 0421) Weight:  [54.432 kg (120 lb)-63.5 kg (139 lb 15.9 oz)] 63.5 kg (139 lb 15.9 oz) (10/24 0421) Weight change:  Last BM Date: 11/05/11  Intake/Output from previous day: 10/23 0701 - 10/24 0700 In: 1150 [I.V.:1150] Out: 250 [Urine:250]  PHYSICAL EXAM General appearance: alert, cooperative and moderate distress Resp: diminished breath sounds bilaterally Cardio: regular rate and rhythm, S1, S2 normal, no murmur, click, rub or gallop GI: She is mildly diffusely tender. Bowel sounds are slightly hyperactive. She does not have any rebound tenderness. Extremities: extremities normal, atraumatic, no cyanosis or edema  Lab Results:    Basic Metabolic Panel:  Basename 11/05/11 0446 11/04/11 1627  NA 141 139  K 3.3* 3.3*  CL 108 98  CO2 25 29  GLUCOSE 111* 114*  BUN 13 13  CREATININE 0.56 0.65  CALCIUM 7.7* 9.7  MG -- --  PHOS -- --   Liver Function Tests:  Basename 11/04/11 1627 11/02/11 1526  AST 34 20  ALT 35 16  ALKPHOS 89 71  BILITOT 1.1 0.4  PROT 7.7 7.1  ALBUMIN 3.7 3.7    Basename 11/04/11 1627 11/02/11 1526  LIPASE 27 34  AMYLASE -- --   No results found for this basename: AMMONIA:2 in the last 72 hours CBC:  Basename 11/05/11 0446 11/04/11 1627 11/02/11 1526  WBC 18.2* 3.5* --  NEUTROABS -- 3.0 6.6  HGB 12.8 16.2* --  HCT 39.5 47.8* --  MCV 99.0 98.4 --  PLT 108* 101* --   Cardiac Enzymes: No results found for this basename: CKTOTAL:3,CKMB:3,CKMBINDEX:3,TROPONINI:3 in the last 72  hours BNP: No results found for this basename: PROBNP:3 in the last 72 hours D-Dimer: No results found for this basename: DDIMER:2 in the last 72 hours CBG: No results found for this basename: GLUCAP:6 in the last 72 hours Hemoglobin A1C: No results found for this basename: HGBA1C in the last 72 hours Fasting Lipid Panel: No results found for this basename: CHOL,HDL,LDLCALC,TRIG,CHOLHDL,LDLDIRECT in the last 72 hours Thyroid Function Tests: No results found for this basename: TSH,T4TOTAL,FREET4,T3FREE,THYROIDAB in the last 72 hours Anemia Panel: No results found for this basename: VITAMINB12,FOLATE,FERRITIN,TIBC,IRON,RETICCTPCT in the last 72 hours Coagulation: No results found for this basename: LABPROT:2,INR:2 in the last 72 hours Urine Drug Screen: Drugs of Abuse  No results found for this basename: labopia, cocainscrnur, labbenz, amphetmu, thcu, labbarb    Alcohol Level: No results found for this basename: ETH:2 in the last 72 hours Urinalysis:  Basename 11/04/11 1730  COLORURINE YELLOW  LABSPEC 1.020  PHURINE 6.0  GLUCOSEU NEGATIVE  HGBUR SMALL*  BILIRUBINUR NEGATIVE  KETONESUR NEGATIVE  PROTEINUR 30*  UROBILINOGEN 0.2  NITRITE NEGATIVE  LEUKOCYTESUR NEGATIVE   Misc. Labs:  ABGS No results found for this basename: PHART,PCO2,PO2ART,TCO2,HCO3 in the last 72 hours CULTURES No results found for this or any previous visit (from the past 240 hour(s)). Studies/Results: Dg Chest Portable 1 View  11/04/2011  *RADIOLOGY REPORT*  Clinical Data:  Diarrhea, vomiting, history hypertension, smoking,  coronary artery disease post stenting  PORTABLE CHEST - 1 VIEW  Comparison: Portable exam 1801 hours compared to 10/07/2009  Findings: Enlargement of cardiac silhouette with pulmonary vascular congestion. Atherosclerotic calcification of a tortuous thoracic aorta. Enlargement of right hilum, similar to 07/28/2008, question pulmonary arterial hypertension. Emphysematous and minimal  bronchitic changes. Accentuation of interstitial markings perhaps minimally increased since previous exam, cannot exclude mild CHF. Minimal bibasilar atelectasis. Biapical scarring. No gross pleural effusion or pneumothorax.  IMPRESSION: Enlargement of cardiac silhouette with pulmonary vascular congestion and question minimal pulmonary edema. Emphysematous and bronchitic changes. Enlargement of right hilum unchanged since 2010, suspect related to enlargement pulmonary artery question pulmonary arterial hypertension.   Original Report Authenticated By: Lollie Marrow, M.D.     Medications:  Prior to Admission:  Prescriptions prior to admission  Medication Sig Dispense Refill  . acetaminophen (TYLENOL) 500 MG tablet Take 500 mg by mouth every 6 (six) hours as needed. For pain       . albuterol (PROVENTIL HFA;VENTOLIN HFA) 108 (90 BASE) MCG/ACT inhaler Inhale 2 puffs into the lungs 2 (two) times daily as needed. Shortness of Breath      . atenolol (TENORMIN) 25 MG tablet Take 12.5 mg by mouth daily.        . Calcium Carbonate-Vitamin D (CALTRATE 600+D) 600-400 MG-UNIT per tablet Take 1 tablet by mouth daily.        . diphenoxylate-atropine (LOMOTIL) 2.5-0.025 MG per tablet Take 1 tablet by mouth 4 (four) times daily as needed for diarrhea or loose stools.  10 tablet  0  . escitalopram (LEXAPRO) 5 MG tablet Take 5 mg by mouth daily.        . ondansetron (ZOFRAN ODT) 4 MG disintegrating tablet 4mg  ODT q4 hours prn nausea/vomit  4 tablet  1  . pantoprazole (PROTONIX) 40 MG tablet Take 40 mg by mouth daily.        . simvastatin (ZOCOR) 40 MG tablet Take 40 mg by mouth at bedtime.        . vitamin E 200 UNIT capsule Take 200 Units by mouth daily.        Marland Kitchen zolpidem (AMBIEN) 10 MG tablet Take 5 mg by mouth at bedtime as needed. For sleep       Scheduled:   . sodium chloride   Intravenous Once  . acetaminophen  650 mg Rectal Once  . atenolol  12.5 mg Oral Daily  . enoxaparin (LOVENOX) injection  40 mg  Subcutaneous Q24H  . escitalopram  5 mg Oral Daily  . famotidine  20 mg Intravenous Once  . ondansetron  4 mg Intravenous Once  . simvastatin  40 mg Oral QHS   Continuous:   . 0.9 % NaCl with KCl 20 mEq / L 125 mL/hr at 11/04/11 2045   UJW:[id number], acetaminophen, albuterol, diphenoxylate-atropine, HYDROcodone-acetaminophen, ondansetron (ZOFRAN) IV, ondansetron, zolpidem  Assesment: She has an acute gastrointestinal illness. Her white blood count when she came to the emergency room yesterday was only 3500 but is up over 18,000 now. She is still having diarrhea and nausea. There is concern that this could represent C. difficile. Active Problems:  * No active hospital problems. *     Plan: She will have C. difficile by PCR and I will ask for GI consult. I did not start her on Flagyl yet until I see with the GI consultant  thinks    LOS: 1 day   Rosealie Reach L 11/05/2011, 8:53 AM

## 2011-11-05 NOTE — Progress Notes (Signed)
Called [name] to notify her of patients +c-diff results. Patient already ordered antibiotics. No new orders.

## 2011-11-05 NOTE — Progress Notes (Signed)
UR Chart Review Completed  

## 2011-11-06 DIAGNOSIS — I2510 Atherosclerotic heart disease of native coronary artery without angina pectoris: Secondary | ICD-10-CM | POA: Diagnosis present

## 2011-11-06 DIAGNOSIS — A0472 Enterocolitis due to Clostridium difficile, not specified as recurrent: Secondary | ICD-10-CM | POA: Diagnosis present

## 2011-11-06 DIAGNOSIS — F32A Depression, unspecified: Secondary | ICD-10-CM | POA: Diagnosis present

## 2011-11-06 DIAGNOSIS — E876 Hypokalemia: Secondary | ICD-10-CM | POA: Diagnosis present

## 2011-11-06 DIAGNOSIS — J449 Chronic obstructive pulmonary disease, unspecified: Secondary | ICD-10-CM | POA: Diagnosis present

## 2011-11-06 DIAGNOSIS — F419 Anxiety disorder, unspecified: Secondary | ICD-10-CM | POA: Diagnosis present

## 2011-11-06 DIAGNOSIS — E860 Dehydration: Secondary | ICD-10-CM | POA: Diagnosis present

## 2011-11-06 DIAGNOSIS — F329 Major depressive disorder, single episode, unspecified: Secondary | ICD-10-CM | POA: Diagnosis present

## 2011-11-06 LAB — CBC WITH DIFFERENTIAL/PLATELET
Basophils Absolute: 0 10*3/uL (ref 0.0–0.1)
Eosinophils Absolute: 0.2 10*3/uL (ref 0.0–0.7)
Eosinophils Relative: 2 % (ref 0–5)
HCT: 38.9 % (ref 36.0–46.0)
Lymphocytes Relative: 13 % (ref 12–46)
MCH: 32.7 pg (ref 26.0–34.0)
MCHC: 32.4 g/dL (ref 30.0–36.0)
MCV: 101 fL — ABNORMAL HIGH (ref 78.0–100.0)
Monocytes Absolute: 1 10*3/uL (ref 0.1–1.0)
RDW: 13.5 % (ref 11.5–15.5)

## 2011-11-06 LAB — BASIC METABOLIC PANEL
BUN: 11 mg/dL (ref 6–23)
CO2: 27 mEq/L (ref 19–32)
Calcium: 8.5 mg/dL (ref 8.4–10.5)
Chloride: 111 mEq/L (ref 96–112)
Creatinine, Ser: 0.53 mg/dL (ref 0.50–1.10)
Glucose, Bld: 99 mg/dL (ref 70–99)

## 2011-11-06 MED ORDER — HYDROCODONE-HOMATROPINE 5-1.5 MG/5ML PO SYRP
5.0000 mL | ORAL_SOLUTION | ORAL | Status: DC | PRN
  Administered 2011-11-06 – 2011-11-09 (×6): 5 mL via ORAL
  Filled 2011-11-06 (×6): qty 5

## 2011-11-06 MED ORDER — SODIUM CHLORIDE 0.9 % IV SOLN
INTRAVENOUS | Status: DC
  Administered 2011-11-06 – 2011-11-08 (×4): via INTRAVENOUS
  Administered 2011-11-08: 10 mL/h via INTRAVENOUS

## 2011-11-06 MED ORDER — SODIUM CHLORIDE 0.9 % IJ SOLN
INTRAMUSCULAR | Status: AC
  Administered 2011-11-06: 22:00:00
  Filled 2011-11-06: qty 3

## 2011-11-06 MED ORDER — SACCHAROMYCES BOULARDII 250 MG PO CAPS
250.0000 mg | ORAL_CAPSULE | Freq: Two times a day (BID) | ORAL | Status: DC
  Administered 2011-11-06 – 2011-11-09 (×7): 250 mg via ORAL
  Filled 2011-11-06 (×7): qty 1

## 2011-11-06 NOTE — Progress Notes (Signed)
PT Cancellation Note  Patient Details Name: [name] MRN: [id number] DOB: 08-04-1925   Cancelled Treatment:   Attempted to do an eval.  Pt states that she has been up and about in the room, feels much better and does not feel as if she needs any PT.  She has no concern about managing at home.  I asked her to let MD or nurse know if she changes her mind and I will be happy to reurn.   Myrlene Broker L 11/06/2011, 10:52 AM

## 2011-11-06 NOTE — Progress Notes (Addendum)
Subjective:  Feels better. Four BMs yesterday. None so far today. Abdominal pain better. Wants to advance diet.   Objective: Vital signs in last 24 hours: Temp:  [98.5 F (36.9 C)-99.6 F (37.6 C)] 98.8 F (37.1 C) (10/25 0426) Pulse Rate:  [75-92] 75  (10/25 0426) Resp:  [20-24] 20  (10/25 0426) BP: (90-106)/(42-60) 94/56 mmHg (10/25 0426) SpO2:  [91 %-97 %] 91 % (10/25 0426) Weight:  [154 lb 11.2 oz (70.171 kg)] 154 lb 11.2 oz (70.171 kg) (10/25 0426) Last BM Date: 11/05/11 General:   Alert,  Well-developed, well-nourished, pleasant and cooperative in NAD Head:  Normocephalic and atraumatic. Eyes:  Sclera clear, no icterus.   Abdomen:  Soft, nontender and nondistended. Normal bowel sounds, without guarding, and without rebound.   Extremities:  Without clubbing, deformity or edema. Neurologic:  Alert and  oriented x4;  grossly normal neurologically. Skin:  Intact without significant lesions or rashes. Psych:  Alert and cooperative. Normal mood and affect.  Intake/Output from previous day: 10/24 0701 - 10/25 0700 In: 3325 [P.O.:300; I.V.:3025] Out: 600 [Urine:600] Intake/Output this shift:    Lab Results: CBC  Basename 11/06/11 0456 11/05/11 0446 11/04/11 1627  WBC 9.6 18.2* 3.5*  HGB 12.6 12.8 16.2*  HCT 38.9 39.5 47.8*  MCV 101.0* 99.0 98.4  PLT 89* 108* 101*   BMET  Basename 11/06/11 0456 11/05/11 0446 11/04/11 1627  NA 141 141 139  K 4.3 3.3* 3.3*  CL 111 108 98  CO2 27 25 29   GLUCOSE 99 111* 114*  BUN 11 13 13   CREATININE 0.53 0.56 0.65  CALCIUM 8.5 7.7* 9.7   LFTs  Basename 11/04/11 1627  BILITOT 1.1  BILIDIR --  IBILI --  ALKPHOS 89  AST 34  ALT 35  PROT 7.7  ALBUMIN 3.7    Basename 11/04/11 1627  LIPASE 27          Assessment: 78 y/o female admitted with acute onset N/V/D. C.Diff PCR was positive. Clinically improved. WBC down as well.  Plan: 1. Advance to low residue, no dairy diet. 2. Continue vancomycin po for 14 days.   LOS: 2  days   Tana Coast  11/06/2011, 8:10 AM   Still no BM today when I saw her at 1345. Tolerated half of a [place] sandwich and french fries for lunch without any difficulties. Overall, doing much better. I anticipate discharge within the next 24 hours

## 2011-11-06 NOTE — Progress Notes (Signed)
Subjective: She says she feels better. She has not had any significant diarrhea. She is hungry. She still has some cramping in her abdomen and she is weak. She has also developed a cough  Objective: Vital signs in last 24 hours: Temp:  [97.8 F (36.6 C)-99.6 F (37.6 C)] 97.8 F (36.6 C) (10/25 0814) Pulse Rate:  [72-92] 72  (10/25 0814) Resp:  [20-24] 20  (10/25 0814) BP: (90-112)/(42-60) 112/58 mmHg (10/25 0814) SpO2:  [91 %-97 %] 91 % (10/25 0426) Weight:  [70.171 kg (154 lb 11.2 oz)] 70.171 kg (154 lb 11.2 oz) (10/25 0426) Weight change: 15.74 kg (34 lb 11.2 oz) Last BM Date: 11/05/11  Intake/Output from previous day: 10/24 0701 - 10/25 0700 In: 3325 [P.O.:300; I.V.:3025] Out: 600 [Urine:600]  PHYSICAL EXAM General appearance: alert, cooperative and mild distress Resp: rhonchi bilaterally Cardio: regular rate and rhythm, S1, S2 normal, no murmur, click, rub or gallop GI: She still has mild diffuse abdominal tenderness and hyperactive bowel Extremities: extremities normal, atraumatic, no cyanosis or edema  Lab Results:    Basic Metabolic Panel:  Basename 11/06/11 0456 11/05/11 0446  NA 141 141  K 4.3 3.3*  CL 111 108  CO2 27 25  GLUCOSE 99 111*  BUN 11 13  CREATININE 0.53 0.56  CALCIUM 8.5 7.7*  MG -- --  PHOS -- --   Liver Function Tests:  [facility] 11/04/11 1627  AST 34  ALT 35  ALKPHOS 89  BILITOT 1.1  PROT 7.7  ALBUMIN 3.7    Basename 11/04/11 1627  LIPASE 27  AMYLASE --   No results found for this basename: AMMONIA:2 in the last 72 hours CBC:  Basename 11/06/11 0456 11/05/11 0446 11/04/11 1627  WBC 9.6 18.2* --  NEUTROABS 7.1 -- 3.0  HGB 12.6 12.8 --  HCT 38.9 39.5 --  MCV 101.0* 99.0 --  PLT 89* 108* --   Cardiac Enzymes: No results found for this basename: CKTOTAL:3,CKMB:3,CKMBINDEX:3,TROPONINI:3 in the last 72 hours BNP: No results found for this basename: PROBNP:3 in the last 72 hours D-Dimer: No results found for this basename:  DDIMER:2 in the last 72 hours CBG: No results found for this basename: GLUCAP:6 in the last 72 hours Hemoglobin A1C: No results found for this basename: HGBA1C in the last 72 hours Fasting Lipid Panel: No results found for this basename: CHOL,HDL,LDLCALC,TRIG,CHOLHDL,LDLDIRECT in the last 72 hours Thyroid Function Tests: No results found for this basename: TSH,T4TOTAL,FREET4,T3FREE,THYROIDAB in the last 72 hours Anemia Panel: No results found for this basename: VITAMINB12,FOLATE,FERRITIN,TIBC,IRON,RETICCTPCT in the last 72 hours Coagulation: No results found for this basename: LABPROT:2,INR:2 in the last 72 hours Urine Drug Screen: Drugs of Abuse  No results found for this basename: labopia, cocainscrnur, labbenz, amphetmu, thcu, labbarb    Alcohol Level: No results found for this basename: ETH:2 in the last 72 hours Urinalysis:  Basename 11/04/11 1730  COLORURINE YELLOW  LABSPEC 1.020  PHURINE 6.0  GLUCOSEU NEGATIVE  HGBUR SMALL*  BILIRUBINUR NEGATIVE  KETONESUR NEGATIVE  PROTEINUR 30*  UROBILINOGEN 0.2  NITRITE NEGATIVE  LEUKOCYTESUR NEGATIVE   Misc. Labs:  ABGS No results found for this basename: PHART,PCO2,PO2ART,TCO2,HCO3 in the last 72 hours CULTURES Recent Results (from the past 240 hour(s))  CLOSTRIDIUM DIFFICILE BY PCR     Status: Abnormal   Collection Time   11/05/11  8:29 AM      Component Value Range Status Comment   C difficile by pcr POSITIVE (*) NEGATIVE Final    Studies/Results: Dg Chest Portable  1 View  11/04/2011  *RADIOLOGY REPORT*  Clinical Data:  Diarrhea, vomiting, history hypertension, smoking, coronary artery disease post stenting  PORTABLE CHEST - 1 VIEW  Comparison: Portable exam 1801 hours compared to 10/07/2009  Findings: Enlargement of cardiac silhouette with pulmonary vascular congestion. Atherosclerotic calcification of a tortuous thoracic aorta. Enlargement of right hilum, similar to 07/28/2008, question pulmonary arterial hypertension.  Emphysematous and minimal bronchitic changes. Accentuation of interstitial markings perhaps minimally increased since previous exam, cannot exclude mild CHF. Minimal bibasilar atelectasis. Biapical scarring. No gross pleural effusion or pneumothorax.  IMPRESSION: Enlargement of cardiac silhouette with pulmonary vascular congestion and question minimal pulmonary edema. Emphysematous and bronchitic changes. Enlargement of right hilum unchanged since 2010, suspect related to enlargement pulmonary artery question pulmonary arterial hypertension.   Original Report Authenticated By: Lollie Marrow, M.D.     Medications:  Prior to Admission:  Prescriptions prior to admission  Medication Sig Dispense Refill  . acetaminophen (TYLENOL) 500 MG tablet Take 500 mg by mouth every 6 (six) hours as needed. For pain       . albuterol (PROVENTIL HFA;VENTOLIN HFA) 108 (90 BASE) MCG/ACT inhaler Inhale 2 puffs into the lungs 2 (two) times daily as needed. Shortness of Breath      . atenolol (TENORMIN) 25 MG tablet Take 12.5 mg by mouth daily.        . Calcium Carbonate-Vitamin D (CALTRATE 600+D) 600-400 MG-UNIT per tablet Take 1 tablet by mouth daily.        . diphenoxylate-atropine (LOMOTIL) 2.5-0.025 MG per tablet Take 1 tablet by mouth 4 (four) times daily as needed for diarrhea or loose stools.  10 tablet  0  . escitalopram (LEXAPRO) 5 MG tablet Take 5 mg by mouth daily.        . ondansetron (ZOFRAN ODT) 4 MG disintegrating tablet 4mg  ODT q4 hours prn nausea/vomit  4 tablet  1  . pantoprazole (PROTONIX) 40 MG tablet Take 40 mg by mouth daily.        . simvastatin (ZOCOR) 40 MG tablet Take 40 mg by mouth at bedtime.        . vitamin E 200 UNIT capsule Take 200 Units by mouth daily.        Marland Kitchen zolpidem (AMBIEN) 10 MG tablet Take 5 mg by mouth at bedtime as needed. For sleep       Scheduled:   . atenolol  12.5 mg Oral Daily  . escitalopram  5 mg Oral Daily  . saccharomyces boulardii  250 mg Oral BID  . simvastatin   40 mg Oral QHS  . vancomycin  125 mg Oral QID  . DISCONTD: enoxaparin (LOVENOX) injection  40 mg Subcutaneous Q24H   Continuous:   . sodium chloride    . DISCONTD: 0.9 % NaCl with KCl 20 mEq / L 125 mL/hr at 11/06/11 1610   RUE:[id number], acetaminophen, albuterol, diphenoxylate-atropine, HYDROcodone-acetaminophen, HYDROcodone-homatropine, ondansetron (ZOFRAN) IV, ondansetron, zolpidem  Assesment: She has C. difficile colitis and he is improving on by mouth vancomycin. Her white blood cell count has decreased. She has pretty severe COPD and she has a cough now but obviously am not going to put her on an antibiotic. She has some problems with anxiety and depression which I think are doing okay considering her illness. She has coronary artery occlusive disease which is stable Active Problems:  * No active hospital problems. *     Plan: Because she says she is hungry now I'm going to advance her diet. I  did add florastor. I will ask for physical therapy to see her.    LOS: 2 days   Denilson Salminen L 11/06/2011, 8:20 AM

## 2011-11-07 MED ORDER — ALBUTEROL SULFATE (5 MG/ML) 0.5% IN NEBU
2.5000 mg | INHALATION_SOLUTION | RESPIRATORY_TRACT | Status: DC | PRN
  Administered 2011-11-07 – 2011-11-08 (×4): 2.5 mg via RESPIRATORY_TRACT
  Filled 2011-11-07 (×3): qty 0.5

## 2011-11-07 MED ORDER — ALBUTEROL SULFATE (5 MG/ML) 0.5% IN NEBU
INHALATION_SOLUTION | RESPIRATORY_TRACT | Status: AC
  Filled 2011-11-07: qty 0.5

## 2011-11-07 MED ORDER — IPRATROPIUM BROMIDE 0.02 % IN SOLN: 0.5000 mg | RESPIRATORY_TRACT | Status: DC

## 2011-11-07 MED ORDER — ALBUTEROL SULFATE (5 MG/ML) 0.5% IN NEBU: 2.5000 mg | INHALATION_SOLUTION | RESPIRATORY_TRACT | Status: DC

## 2011-11-07 MED ORDER — IPRATROPIUM BROMIDE 0.02 % IN SOLN
RESPIRATORY_TRACT | Status: AC
  Filled 2011-11-07: qty 2.5

## 2011-11-07 MED ORDER — IPRATROPIUM BROMIDE 0.02 % IN SOLN
0.5000 mg | RESPIRATORY_TRACT | Status: DC | PRN
  Administered 2011-11-07 – 2011-11-08 (×3): 0.5 mg via RESPIRATORY_TRACT
  Filled 2011-11-07 (×3): qty 2.5

## 2011-11-07 NOTE — Progress Notes (Signed)
Notified Dr. Juanetta Gosling that I was concerned about the pts O2 sats.  I voiced to MD that on assessment yesterday to be pale and on room air.  She also had a chest xray that showed pulmonary vascular congestion with possible pulmonary edema.  I checked her O2 sats because on 2.5L it was documented that she was 91%.  Her sats was 51%.  I replaced her O2 at 2.5L and and her sats returned to approx. Low 90's.  The pts lung sounds were clear/diminished and she had no complaints.  I continued to monitor her, and continued to remind her she should keep her O2 on.  She verbalized understanding.

## 2011-11-07 NOTE — Progress Notes (Signed)
Subjective: She says she feels much better. She has been ambulating some. I have noted the changes in her oxygen saturation. I think she'll have to go home on oxygen. I don't think he's quite ready for discharge. She is still pretty weak. She is able daily. I reviewed her chest x-ray and her physical examination I do not think he has pulmonary edema. I think this is related to her COPD  Objective: Vital signs in last 24 hours: Temp:  [97.8 F (36.6 C)-98.6 F (37 C)] 98.1 F (36.7 C) (10/26 0626) Pulse Rate:  [68-81] 81  (10/26 0626) Resp:  [20] 20  (10/26 0626) BP: (99-125)/(60-68) 125/68 mmHg (10/26 0626) SpO2:  [51 %-93 %] 93 % (10/26 0626) Weight:  [73.619 kg (162 lb 4.8 oz)] 73.619 kg (162 lb 4.8 oz) (10/26 0626) Weight change: 3.447 kg (7 lb 9.6 oz) Last BM Date: 11/05/11  Intake/Output from previous day: 10/25 0701 - 10/26 0700 In: 2130 [P.O.:740; I.V.:1390] Out: 1780 [Urine:1780]  PHYSICAL EXAM General appearance: alert, cooperative and mild distress Resp: rhonchi bilaterally Cardio: regular rate and rhythm, S1, S2 normal, no murmur, click, rub or gallop GI: Minimal diffuse tenderness and slightly hyperactive bowel sounds Extremities: extremities normal, atraumatic, no cyanosis or edema  Lab Results:    Basic Metabolic Panel:  Basename 11/06/11 0456 11/05/11 0446  NA 141 141  K 4.3 3.3*  CL 111 108  CO2 27 25  GLUCOSE 99 111*  BUN 11 13  CREATININE 0.53 0.56  CALCIUM 8.5 7.7*  MG -- --  PHOS -- --   Liver Function Tests:  [facility] 11/04/11 1627  AST 34  ALT 35  ALKPHOS 89  BILITOT 1.1  PROT 7.7  ALBUMIN 3.7    Basename 11/04/11 1627  LIPASE 27  AMYLASE --   No results found for this basename: AMMONIA:2 in the last 72 hours CBC:  Basename 11/06/11 0456 11/05/11 0446 11/04/11 1627  WBC 9.6 18.2* --  NEUTROABS 7.1 -- 3.0  HGB 12.6 12.8 --  HCT 38.9 39.5 --  MCV 101.0* 99.0 --  PLT 89* 108* --   Cardiac Enzymes: No results found for this  basename: CKTOTAL:3,CKMB:3,CKMBINDEX:3,TROPONINI:3 in the last 72 hours BNP: No results found for this basename: PROBNP:3 in the last 72 hours D-Dimer: No results found for this basename: DDIMER:2 in the last 72 hours CBG: No results found for this basename: GLUCAP:6 in the last 72 hours Hemoglobin A1C: No results found for this basename: HGBA1C in the last 72 hours Fasting Lipid Panel: No results found for this basename: CHOL,HDL,LDLCALC,TRIG,CHOLHDL,LDLDIRECT in the last 72 hours Thyroid Function Tests: No results found for this basename: TSH,T4TOTAL,FREET4,T3FREE,THYROIDAB in the last 72 hours Anemia Panel: No results found for this basename: VITAMINB12,FOLATE,FERRITIN,TIBC,IRON,RETICCTPCT in the last 72 hours Coagulation: No results found for this basename: LABPROT:2,INR:2 in the last 72 hours Urine Drug Screen: Drugs of Abuse  No results found for this basename: labopia, cocainscrnur, labbenz, amphetmu, thcu, labbarb    Alcohol Level: No results found for this basename: ETH:2 in the last 72 hours Urinalysis:  Basename 11/04/11 1730  COLORURINE YELLOW  LABSPEC 1.020  PHURINE 6.0  GLUCOSEU NEGATIVE  HGBUR SMALL*  BILIRUBINUR NEGATIVE  KETONESUR NEGATIVE  PROTEINUR 30*  UROBILINOGEN 0.2  NITRITE NEGATIVE  LEUKOCYTESUR NEGATIVE   Misc. Labs:  ABGS No results found for this basename: PHART,PCO2,PO2ART,TCO2,HCO3 in the last 72 hours CULTURES Recent Results (from the past 240 hour(s))  CLOSTRIDIUM DIFFICILE BY PCR     Status: Abnormal  Collection Time   11/05/11  8:29 AM      Component Value Range Status Comment   C difficile by pcr POSITIVE (*) NEGATIVE Final    Studies/Results: No results found.  Medications:  Scheduled:   . atenolol  12.5 mg Oral Daily  . escitalopram  5 mg Oral Daily  . saccharomyces boulardii  250 mg Oral BID  . simvastatin  40 mg Oral QHS  . sodium chloride      . vancomycin  125 mg Oral QID   Continuous:   . sodium chloride 75  mL/hr at 11/06/11 2117   ONG:[id number], acetaminophen, albuterol, diphenoxylate-atropine, HYDROcodone-acetaminophen, HYDROcodone-homatropine, ondansetron (ZOFRAN) IV, ondansetron, zolpidem  Assesment: She has Clostridium difficile colitis. She has severe COPD. She has been hypoxic. Principal Problem:  *Clostridium difficile colitis Active Problems:  Dehydration  Hypokalemia  COPD (chronic obstructive pulmonary disease)  Coronary atherosclerosis  Anxiety  Depression    Plan: I don't think she's ready for discharge quite yet but may be tomorrow. She will need oxygen at home which will be a new treatment for her    LOS: 3 days   [name] 11/07/2011, 9:04 AM

## 2011-11-07 NOTE — Progress Notes (Signed)
Notified Dr. Renard Matter of pts wheezing.  She is also c/o not being able to breathe while lying down.  New orders given and followed.

## 2011-11-08 MED ORDER — PREDNISONE 10 MG PO TABS
20.0000 mg | ORAL_TABLET | Freq: Every day | ORAL | Status: DC
  Administered 2011-11-08 – 2011-11-09 (×2): 20 mg via ORAL
  Filled 2011-11-08: qty 1
  Filled 2011-11-08 (×2): qty 2

## 2011-11-08 MED ORDER — IPRATROPIUM BROMIDE 0.02 % IN SOLN
0.5000 mg | RESPIRATORY_TRACT | Status: DC
  Administered 2011-11-08 – 2011-11-09 (×5): 0.5 mg via RESPIRATORY_TRACT
  Filled 2011-11-08 (×5): qty 2.5

## 2011-11-08 MED ORDER — ALBUTEROL SULFATE (5 MG/ML) 0.5% IN NEBU
2.5000 mg | INHALATION_SOLUTION | RESPIRATORY_TRACT | Status: DC
  Administered 2011-11-08 – 2011-11-09 (×5): 2.5 mg via RESPIRATORY_TRACT
  Filled 2011-11-08 (×5): qty 0.5

## 2011-11-08 MED ORDER — FUROSEMIDE 10 MG/ML IJ SOLN
40.0000 mg | Freq: Once | INTRAMUSCULAR | Status: AC
  Administered 2011-11-08: 40 mg via INTRAVENOUS
  Filled 2011-11-08: qty 4

## 2011-11-08 NOTE — Progress Notes (Signed)
Subjective: She says she doesn't feel well this morning. She has no other new complaints. She says she's feels like she has a cold. We discussed that and she understands I do not want to put her on antibiotics. She did say that she's not been able to breathe while she's lying down and she has a history of cardiac disease but no history of CHF.  Objective: Vital signs in last 24 hours: Temp:  [98 F (36.7 C)-98.4 F (36.9 C)] 98.4 F (36.9 C) (10/27 0644) Pulse Rate:  [54-96] 96  (10/27 0644) Resp:  [16-20] 16  (10/27 0644) BP: (97-159)/(58-71) 124/63 mmHg (10/27 0644) SpO2:  [74 %-97 %] 92 % (10/27 0715) Weight:  [74.163 kg (163 lb 8 oz)] 74.163 kg (163 lb 8 oz) (10/27 0506) Weight change: 0.544 kg (1 lb 3.2 oz) Last BM Date: 11/05/11  Intake/Output from previous day: 10/26 0701 - 10/27 0700 In: 240 [P.O.:240] Out: 1000 [Urine:1000]  PHYSICAL EXAM General appearance: alert, cooperative and moderate distress Resp: rhonchi bilaterally Cardio: regular rate and rhythm, S1, S2 normal, no murmur, click, rub or gallop GI: soft, non-tender; bowel sounds normal; no masses,  no organomegaly Extremities: extremities normal, atraumatic, no cyanosis or edema  Lab Results:    Basic Metabolic Panel:  Basename 11/06/11 0456  NA 141  K 4.3  CL 111  CO2 27  GLUCOSE 99  BUN 11  CREATININE 0.53  CALCIUM 8.5  MG --  PHOS --   Liver Function Tests: No results found for this basename: AST:2,ALT:2,ALKPHOS:2,BILITOT:2,PROT:2,ALBUMIN:2 in the last 72 hours No results found for this basename: LIPASE:2,AMYLASE:2 in the last 72 hours No results found for this basename: AMMONIA:2 in the last 72 hours CBC:  Basename 11/06/11 0456  WBC 9.6  NEUTROABS 7.1  HGB 12.6  HCT 38.9  MCV 101.0*  PLT 89*   Cardiac Enzymes: No results found for this basename: CKTOTAL:3,CKMB:3,CKMBINDEX:3,TROPONINI:3 in the last 72 hours BNP: No results found for this basename: PROBNP:3 in the last 72  hours D-Dimer: No results found for this basename: DDIMER:2 in the last 72 hours CBG: No results found for this basename: GLUCAP:6 in the last 72 hours Hemoglobin A1C: No results found for this basename: HGBA1C in the last 72 hours Fasting Lipid Panel: No results found for this basename: CHOL,HDL,LDLCALC,TRIG,CHOLHDL,LDLDIRECT in the last 72 hours Thyroid Function Tests: No results found for this basename: TSH,T4TOTAL,FREET4,T3FREE,THYROIDAB in the last 72 hours Anemia Panel: No results found for this basename: VITAMINB12,FOLATE,FERRITIN,TIBC,IRON,RETICCTPCT in the last 72 hours Coagulation: No results found for this basename: LABPROT:2,INR:2 in the last 72 hours Urine Drug Screen: Drugs of Abuse  No results found for this basename: labopia, cocainscrnur, labbenz, amphetmu, thcu, labbarb    Alcohol Level: No results found for this basename: ETH:2 in the last 72 hours Urinalysis: No results found for this basename: COLORURINE:2,APPERANCEUR:2,LABSPEC:2,PHURINE:2,GLUCOSEU:2,HGBUR:2,BILIRUBINUR:2,KETONESUR:2,PROTEINUR:2,UROBILINOGEN:2,NITRITE:2,LEUKOCYTESUR:2 in the last 72 hours Misc. Labs:  ABGS No results found for this basename: PHART,PCO2,PO2ART,TCO2,HCO3 in the last 72 hours CULTURES Recent Results (from the past 240 hour(s))  CLOSTRIDIUM DIFFICILE BY PCR     Status: Abnormal   Collection Time   11/05/11  8:29 AM      Component Value Range Status Comment   C difficile by pcr POSITIVE (*) NEGATIVE Final    Studies/Results: No results found.  Medications:  Prior to Admission:  Prescriptions prior to admission  Medication Sig Dispense Refill  . acetaminophen (TYLENOL) 500 MG tablet Take 500 mg by mouth every 6 (six) hours as needed. For pain       .  albuterol (PROVENTIL HFA;VENTOLIN HFA) 108 (90 BASE) MCG/ACT inhaler Inhale 2 puffs into the lungs 2 (two) times daily as needed. Shortness of Breath      . atenolol (TENORMIN) 25 MG tablet Take 12.5 mg by mouth daily.        .  Calcium Carbonate-Vitamin D (CALTRATE 600+D) 600-400 MG-UNIT per tablet Take 1 tablet by mouth daily.        . diphenoxylate-atropine (LOMOTIL) 2.5-0.025 MG per tablet Take 1 tablet by mouth 4 (four) times daily as needed for diarrhea or loose stools.  10 tablet  0  . escitalopram (LEXAPRO) 5 MG tablet Take 5 mg by mouth daily.        . ondansetron (ZOFRAN ODT) 4 MG disintegrating tablet 4mg  ODT q4 hours prn nausea/vomit  4 tablet  1  . pantoprazole (PROTONIX) 40 MG tablet Take 40 mg by mouth daily.        . simvastatin (ZOCOR) 40 MG tablet Take 40 mg by mouth at bedtime.        . vitamin E 200 UNIT capsule Take 200 Units by mouth daily.        Marland Kitchen zolpidem (AMBIEN) 10 MG tablet Take 5 mg by mouth at bedtime as needed. For sleep       Scheduled:   . albuterol      . atenolol  12.5 mg Oral Daily  . escitalopram  5 mg Oral Daily  . ipratropium      . predniSONE  20 mg Oral Q breakfast  . saccharomyces boulardii  250 mg Oral BID  . simvastatin  40 mg Oral QHS  . vancomycin  125 mg Oral QID  . DISCONTD: albuterol  2.5 mg Nebulization Q4H  . DISCONTD: ipratropium  0.5 mg Nebulization Q4H   Continuous:   . sodium chloride 75 mL/hr at 11/08/11 0034   ZOX:[id number], acetaminophen, albuterol, albuterol, diphenoxylate-atropine, HYDROcodone-acetaminophen, HYDROcodone-homatropine, ipratropium, ondansetron (ZOFRAN) IV, ondansetron, zolpidem  Assesment: She was admitted with C. difficile colitis and that is better. She has multiple other medical problems. She says she's short of breath and I think she has an acute respiratory infection. She has some symptoms of CHF so she's going to have an echocardiogram to be sure about that Principal Problem:  *Clostridium difficile colitis Active Problems:  Dehydration  Hypokalemia  COPD (chronic obstructive pulmonary disease)  Coronary atherosclerosis  Anxiety  Depression    Plan: I will give her a single dose of Lasix. She will have echocardiogram.  She will start on prednisone.    LOS: 4 days   Kiely Cousar L 11/08/2011, 8:07 AM

## 2011-11-08 NOTE — Progress Notes (Signed)
Dr. Renard Matter notified of urine C&S results.  No new orders.

## 2011-11-09 DIAGNOSIS — A0472 Enterocolitis due to Clostridium difficile, not specified as recurrent: Secondary | ICD-10-CM

## 2011-11-09 DIAGNOSIS — I2510 Atherosclerotic heart disease of native coronary artery without angina pectoris: Secondary | ICD-10-CM

## 2011-11-09 MED ORDER — SODIUM CHLORIDE 0.9 % IJ SOLN
INTRAMUSCULAR | Status: AC
  Filled 2011-11-09: qty 3

## 2011-11-09 MED ORDER — SACCHAROMYCES BOULARDII 250 MG PO CAPS: 250.0000 mg | ORAL_CAPSULE | Freq: Two times a day (BID) | ORAL | Status: DC

## 2011-11-09 MED ORDER — VANCOMYCIN 50 MG/ML ORAL SOLUTION: 125.0000 mg | Freq: Four times a day (QID) | ORAL | Status: DC

## 2011-11-09 MED ORDER — PREDNISONE 20 MG PO TABS: 20.0000 mg | ORAL_TABLET | Freq: Every day | ORAL | Status: DC

## 2011-11-09 NOTE — Progress Notes (Signed)
Patient was still having an oxygen stat of 91% on 2L of oxygen. RN though that maybe an IS would help, so RN called respiratory to bring one up for patient.

## 2011-11-09 NOTE — Discharge Instructions (Signed)
Clostridium Difficile Infection Clostridium difficile (C. diff) is a bacteria found in the intestinal tract or colon. Under certain conditions, it causes diarrhea and sometimes severe disease. The severe form of the disease is known as pseudomembranous colitis (often called C. diff colitis). This disease can damage the lining of the colon or cause the colon to become enlarged (toxic megacolon).  CAUSES  Your colon normally contains many different bacteria, including C. diff. The balance of bacteria in your colon can change during illness. This is especially true when you take antibiotic medicine. Taking antibiotics may allow the C. diff to grow, multiply excessively, and make a toxin that then causes illness. The elderly and people with certain medical conditions have a greater risk of getting C. diff infections. SYMPTOMS   Watery diarrhea.  Fever.  Fatigue.  Loss of appetite.  Nausea.  Abdominal swelling, pain, or tenderness.  Dehydration. DIAGNOSIS  Your symptoms may make your caregiver suspicious of a C. diff infection, especially if you have used antibiotics in the preceding weeks. However, there are only 2 ways to know for certain whether you have a C. diff infection:  A lab test that finds the toxin in your stool.  The specific appearance of an abnormality (pseudomembrane) in your colon. This can only be seen by doing a sigmoidoscopy or colonoscopy. These procedures involve passing an instrument through your rectum to look at the inside of your colon. Your caregiver will help determine if these tests are necessary. TREATMENT   Most people are successfully treated with 1 of 2 specific antibiotics, usually given by mouth. Other antibiotics you are receiving are stopped if possible.  Intravenous (IV) fluids and correction of electrolyte imbalance may be necessary.  Rarely, surgery may be needed to remove the infected part of the intestines.  Careful hand washing by you and your  caregivers is important to prevent the spread of infection. In the hospital, your caregivers may also put on gowns and gloves to prevent the spread of the C. diff bacteria. Your room is also cleaned regularly with a hospital grade disinfectant. HOME CARE INSTRUCTIONS  Drink enough fluids to keep your urine clear or pale yellow. Avoid milk, caffeine, and alcohol.  Ask your caregiver for specific rehydration instructions.  Try eating small, frequent meals rather than large meals.  Take your antibiotics as directed. Finish them even if you start to feel better.  Do not use medicines to slow diarrhea. This could delay healing or cause complications.  Wash your hands thoroughly after using the bathroom and before preparing food.  Make sure people who live with you wash their hands often, too. SEEK MEDICAL CARE IF:  Diarrhea persists longer than expected or recurs after completing your course of antibiotic treatment for the C. diff infection.  You have trouble staying hydrated. SEEK IMMEDIATE MEDICAL CARE IF:  You develop a new fever.  You have increasing abdominal pain or tenderness.  There is blood in your stools, or your stools are dark black and tarry.  You cannot hold down food or liquids. MAKE SURE YOU:   Understand these instructions.  Will watch your condition.  Will get help right away if you are not doing well or get worse. Document Released: 10/08/2004 Document Revised: 03/23/2011 Document Reviewed: 06/06/2010 [facility] Patient Information 2013 [place], [place]. Colitis Colitis is inflammation of the colon. Colitis can be a short-term or long-standing (chronic) illness. Crohn's disease and ulcerative colitis are 2 types of colitis which are chronic. They usually require lifelong treatment.  CAUSES  There are many different causes of colitis, including:  Viruses.  Germs (bacteria).  Medicine reactions. SYMPTOMS   Diarrhea.  Intestinal  bleeding.  Pain.  Fever.  Throwing up (vomiting).  Tiredness (fatigue).  Weight loss.  Bowel blockage. DIAGNOSIS  The diagnosis of colitis is based on examination and stool or blood tests. X-rays, CT scan, and colonoscopy may also be needed. TREATMENT  Treatment may include:  Fluids given through the vein (intravenously).  Bowel rest (nothing to eat or drink for a period of time).  Medicine for pain and diarrhea.  Medicines (antibiotics) that kill germs.  Cortisone medicines.  Surgery. HOME CARE INSTRUCTIONS   Get plenty of rest.  Drink enough water and fluids to keep your urine clear or pale yellow.  Eat a well-balanced diet.  Call your caregiver for follow-up as recommended. SEEK IMMEDIATE MEDICAL CARE IF:   You develop chills.  You have an oral temperature above 102 F (38.9 C), not controlled by medicine.  You have extreme weakness, fainting, or dehydration.  You have repeated vomiting.  You develop severe belly (abdominal) pain or are passing bloody or tarry stools. MAKE SURE YOU:   Understand these instructions.  Will watch your condition.  Will get help right away if you are not doing well or get worse. Document Released: 02/06/2004 Document Revised: 03/23/2011 Document Reviewed: 05/03/2009 [facility] Patient Information 2013 [place], [place].

## 2011-11-09 NOTE — Progress Notes (Addendum)
Pt  02 saturation was 82% at rest on room air.  Pt had no c/o at this time, she has a dusky/pale like color.  O2 at 2.5 liters was reapplied.

## 2011-11-09 NOTE — Progress Notes (Signed)
Pt requires home O2 at 2 liters nasal cannula continuous for COPD.

## 2011-11-09 NOTE — Progress Notes (Signed)
UR Chart Review Completed  

## 2011-11-09 NOTE — Discharge Summary (Signed)
Physician Discharge Summary  Patient ID: [name] MRN: [id number] DOB/AGE: 76/05/1925 76 y.o. Primary Care Physician:Lovel Suazo L, MD Admit date: 11/04/2011 Discharge date: 11/09/2011    Discharge Diagnoses:   Principal Problem:  *Clostridium difficile colitis Active Problems:  Dehydration  Hypokalemia  COPD (chronic obstructive pulmonary disease)  Coronary atherosclerosis  Anxiety  Depression     Medication List     As of 11/09/2011  6:18 PM    TAKE these medications         acetaminophen 500 MG tablet   Commonly known as: TYLENOL   Take 500 mg by mouth every 6 (six) hours as needed. For pain      albuterol 108 (90 BASE) MCG/ACT inhaler   Commonly known as: PROVENTIL HFA;VENTOLIN HFA   Inhale 2 puffs into the lungs 2 (two) times daily as needed. Shortness of Breath      AMBIEN 10 MG tablet   Generic drug: zolpidem   Take 5 mg by mouth at bedtime as needed. For sleep      atenolol 25 MG tablet   Commonly known as: TENORMIN   Take 12.5 mg by mouth daily.      CALTRATE 600+D 600-400 MG-UNIT per tablet   Generic drug: Calcium Carbonate-Vitamin D   Take 1 tablet by mouth daily.      diphenoxylate-atropine 2.5-0.025 MG per tablet   Commonly known as: LOMOTIL   Take 1 tablet by mouth 4 (four) times daily as needed for diarrhea or loose stools.      escitalopram 5 MG tablet   Commonly known as: LEXAPRO   Take 5 mg by mouth daily.      ondansetron 4 MG disintegrating tablet   Commonly known as: ZOFRAN-ODT   4mg  ODT q4 hours prn nausea/vomit      pantoprazole 40 MG tablet   Commonly known as: PROTONIX   Take 40 mg by mouth daily.      predniSONE 20 MG tablet   Commonly known as: DELTASONE   Take 1 tablet (20 mg total) by mouth daily with breakfast.      saccharomyces boulardii 250 MG capsule   Commonly known as: FLORASTOR   Take 1 capsule (250 mg total) by mouth 2 (two) times daily.      simvastatin 40 MG tablet   Commonly known as: ZOCOR   Take 40 mg by mouth at bedtime.      vancomycin 50 mg/mL oral solution   Commonly known as: VANCOCIN   Take 2.5 mLs (125 mg total) by mouth 4 (four) times daily.      vitamin E 200 UNIT capsule   Take 200 Units by mouth daily.        Discharged Condition: Improved    Consults: Gastroenterology  Significant Diagnostic Studies: Ct Abdomen Pelvis Wo Contrast  11/02/2011  *RADIOLOGY REPORT*  Clinical Data: Nausea, vomiting and diarrhea.  Right lower quadrant pain.  CT ABDOMEN AND PELVIS WITHOUT CONTRAST  Technique:  Multidetector CT imaging of the abdomen and pelvis was performed following the standard protocol without intravenous contrast.  Comparison: 07/28/2008  Findings: There are chronic changes and volume loss at the left lung base.  No evidence for pleural effusions.  There is no evidence for free intraperitoneal air.  There has been enlargement of the descending thoracic aorta which measures up to 4.2 cm.  There are irregular peripheral calcifications involving the descending thoracic aorta which are similar to the prior examination.  The gallbladder has been removed.  No gross abnormality to the liver, spleen, pancreas or adrenal glands.  There is a 1.4 cm low density structure involving the left kidney which has enlarged but most likely represents a cyst.  Two punctate calcifications within the mid pole of the left kidney without hydronephrosis.  There is mild dilatation of the right ureter but no evidence for an obstructing lesion or stone.  Multiple calcifications within the right kidney, predominantly in the mid aspect.  Largest calcification measures up to 0.7 cm.  The uterus has been removed.  There is fluid in the urinary bladder.  There are prominent diverticula involving the sigmoid colon without acute inflammatory changes.  Stable dilatation of the common bile duct measuring up to 1.5 cm.  Small ventral hernia containing fat.  Degenerative disc changes at L4-L5 and L5-S1. There is  a disc bulge or herniation at L4-L5.  IMPRESSION: There are bilateral kidney stones or calcifications, right side greater than left.  No evidence for an obstructing lesion or stone.  Enlargement of an exophytic low-density structure in the left kidney.  This structure is indeterminate on this noncontrast but favor a cyst.  The structure is large enough that it could be further evaluated with ultrasound for further evaluation.  Degenerative changes in the lower lumbar spine.  Enlargement and aneurysmal dilatation of the descending thoracic aorta. Descending thoracic aorta measures up to 4.2 cm.   Original Report Authenticated By: Richarda Overlie, M.D.    Dg Chest Portable 1 View  11/04/2011  *RADIOLOGY REPORT*  Clinical Data:  Diarrhea, vomiting, history hypertension, smoking, coronary artery disease post stenting  PORTABLE CHEST - 1 VIEW  Comparison: Portable exam 1801 hours compared to 10/07/2009  Findings: Enlargement of cardiac silhouette with pulmonary vascular congestion. Atherosclerotic calcification of a tortuous thoracic aorta. Enlargement of right hilum, similar to 07/28/2008, question pulmonary arterial hypertension. Emphysematous and minimal bronchitic changes. Accentuation of interstitial markings perhaps minimally increased since previous exam, cannot exclude mild CHF. Minimal bibasilar atelectasis. Biapical scarring. No gross pleural effusion or pneumothorax.  IMPRESSION: Enlargement of cardiac silhouette with pulmonary vascular congestion and question minimal pulmonary edema. Emphysematous and bronchitic changes. Enlargement of right hilum unchanged since 2010, suspect related to enlargement pulmonary artery question pulmonary arterial hypertension.   Original Report Authenticated By: Lollie Marrow, M.D.     Lab Results: Basic Metabolic Panel: No results found for this basename: NA:2,K:2,CL:2,CO2:2,GLUCOSE:2,BUN:2,CREATININE:2,CALCIUM:2,MG:2,PHOS:2 in the last 72 hours Liver Function Tests: No  results found for this basename: AST:2,ALT:2,ALKPHOS:2,BILITOT:2,PROT:2,ALBUMIN:2 in the last 72 hours   CBC: No results found for this basename: WBC:2,NEUTROABS:2,HGB:2,HCT:2,MCV:2,PLT:2 in the last 72 hours  Recent Results (from the past 240 hour(s))  CLOSTRIDIUM DIFFICILE BY PCR     Status: Abnormal   Collection Time   11/05/11  8:29 AM      Component Value Range Status Comment   C difficile by pcr POSITIVE (*) NEGATIVE Final      Hospital Course: She was admitted with nausea vomiting and diarrhea. She had been to the emergency room and was treated there and discharged home but came back to the hospital about 48 hours later because she was no better. She was mildly hypotensive and tachycardic and appeared to be severely dehydrated. Her white blood cell count was low on admission. She was started on intravenous fluids given medications for nausea and for diarrhea and was tested positive for Clostridium difficile. She had GI consultation and was started on oral vancomycin and improved fairly markedly. She had some question of congestive heart  failure on her chest x-ray and has a pending echocardiogram at the time of discharge. She was hypoxic and we'll have to go home on oxygen. She was able to eat and keep food down and her diarrhea had stopped  Discharge Exam: Blood pressure 134/61, pulse 83, temperature 98.5 F (36.9 C), temperature source Oral, resp. rate 20, height 5\' 5"  (1.651 m), weight 74.163 kg (163 lb 8 oz), SpO2 93.00%. She is awake and alert. Her chest is relatively clear. Heart is regular. Her abdomen minimally if any tender. Her central nervous system exam is grossly intact  Disposition: Home with home health services and oxygen.      Discharge Orders    Future Orders Please Complete By Expires   For home use only DME oxygen      Questions: Responses:   Mode or (Route)    Liters per Minute 2   Frequency    Oxygen conserving device    Home Health      Questions:  Responses:   To provide the following care/treatments PT    RN   Face-to-face encounter      Comments:   I Antavius Sperbeck L certify that this patient is under my care and that I, or a nurse practitioner or physician's assistant working with me, had a face-to-face encounter that meets the physician face-to-face encounter requirements with this patient on 11/09/2011.   Questions: Responses:   The encounter with the patient was in whole, or in part, for the following medical condition, which is the primary reason for home health care colitis   I certify that, based on my findings, the following services are medically necessary home health services Nursing    Physical therapy   My clinical findings support the need for the above services High Risk for rehospitalization   Further, I certify that my clinical findings support that this patient is homebound due to: Ambulates short distances less than 300 feet   To provide the following care/treatments PT    RN        Signed: Fredirick Maudlin Pager [phone]  11/09/2011, 6:18 PM

## 2011-11-09 NOTE — Progress Notes (Signed)
Pt discharged with instructions and carenotes.  Pt and daughter verbalized understanding along with the pt.  Discussed importance of cleaning with bleach and keeping hands washed.  Pt portable home O2 was delivered and she was placed on 2 liters prior to discharge.  Pt left the floor via w/c with staff and family in stable condition.

## 2011-11-09 NOTE — Progress Notes (Signed)
Subjective:  Pt denies diarrhea, vomiting, or abdominal pain.  C/o SOBOE.  Objective: Vital signs in last 24 hours: Temp:  [98.2 F (36.8 C)-98.5 F (36.9 C)] 98.5 F (36.9 C) (10/27 2053) Pulse Rate:  [65-90] 90  (10/27 2053) Resp:  [20] 20  (10/27 2053) BP: (112-127)/(56-67) 127/58 mmHg (10/27 2053) SpO2:  [91 %-94 %] 94 % (10/28 0714) Last BM Date: 11/08/11 General:   Alert, pleasant and cooperative in NAD Head:  Normocephalic and atraumatic. Eyes:  Sclera clear, no icterus.   Abdomen:  Soft, nontender and nondistended. Normal bowel sounds, without guarding, and without rebound.   Extremities:  Without clubbing, deformity or edema. Neurologic:  Alert and  oriented x4;  grossly normal neurologically. Skin:  Intact without significant lesions or rashes. Psych:  Alert and cooperative. Normal mood and affect.  Intake/Output from previous day: 10/27 0701 - 10/28 0700 In: 680 [P.O.:480; I.V.:200] Out: 1800 [Urine:1800] Intake/Output this shift: Total I/O In: -  Out: 200 [Urine:200]  Lab Results: CBC No results found for this basename: WBC:3,HGB:3,HCT:3,MCV:3,PLT:3 in the last 72 hours BMET No results found for this basename: NA:3,K:3,CL:3,CO2:3,GLUCOSE:3,BUN:3,CREATININE:3,CALCIUM:3 in the last 72 hours LFTs No results found for this basename: BILITOT:3,BILIDIR:3,IBILI:3,ALKPHOS:3,AST:3,ALT:3,PROT:3,ALBUMIN:3 in the last 72 hours No results found for this basename: LIPASE:3 in the last 72 hours        Assessment: 1. C diff colitis:  Clinically improved on Vancomycin Day #5 2. N/V:  Resolved  Plan: 1. Reviewed c diff transmission precautions 2. Continue vancomycin po for 14 days total 3. Advised to call if diarrhea returns   LOS: 5 days   Lorenza Burton  11/09/2011, 7:58 AM

## 2011-11-09 NOTE — Progress Notes (Signed)
*  PRELIMINARY RESULTS* Echocardiogram 2D Echocardiogram has been performed.  [name], [name] 11/09/2011, 11:11 AM

## 2011-11-09 NOTE — Progress Notes (Signed)
Subjective: She feels somewhat better. She had a semi-formed bowel movement. She is still having trouble with oxygenation. There was a question as to whether she has additional congestive heart failure which has not been a diagnosis she has had in the past  Objective: Vital signs in last 24 hours: Temp:  [98.2 F (36.8 C)-98.5 F (36.9 C)] 98.5 F (36.9 C) (10/27 2053) Pulse Rate:  [65-90] 90  (10/27 2053) Resp:  [20] 20  (10/27 2053) BP: (112-127)/(56-67) 127/58 mmHg (10/27 2053) SpO2:  [91 %-94 %] 94 % (10/28 0714) Weight change:  Last BM Date: 11/08/11  Intake/Output from previous day: 10/27 0701 - 10/28 0700 In: 680 [P.O.:480; I.V.:200] Out: 1800 [Urine:1800]  PHYSICAL EXAM General appearance: alert, cooperative and mild distress Resp: rhonchi bilaterally Cardio: regular rate and rhythm, S1, S2 normal, no murmur, click, rub or gallop GI: Still mildly tender but much improved Extremities: extremities normal, atraumatic, no cyanosis or edema  Lab Results:    Basic Metabolic Panel: No results found for this basename: NA:2,K:2,CL:2,CO2:2,GLUCOSE:2,BUN:2,CREATININE:2,CALCIUM:2,MG:2,PHOS:2 in the last 72 hours Liver Function Tests: No results found for this basename: AST:2,ALT:2,ALKPHOS:2,BILITOT:2,PROT:2,ALBUMIN:2 in the last 72 hours No results found for this basename: LIPASE:2,AMYLASE:2 in the last 72 hours No results found for this basename: AMMONIA:2 in the last 72 hours CBC: No results found for this basename: WBC:2,NEUTROABS:2,HGB:2,HCT:2,MCV:2,PLT:2 in the last 72 hours Cardiac Enzymes: No results found for this basename: CKTOTAL:3,CKMB:3,CKMBINDEX:3,TROPONINI:3 in the last 72 hours BNP: No results found for this basename: PROBNP:3 in the last 72 hours D-Dimer: No results found for this basename: DDIMER:2 in the last 72 hours CBG: No results found for this basename: GLUCAP:6 in the last 72 hours Hemoglobin A1C: No results found for this basename: HGBA1C in the  last 72 hours Fasting Lipid Panel: No results found for this basename: CHOL,HDL,LDLCALC,TRIG,CHOLHDL,LDLDIRECT in the last 72 hours Thyroid Function Tests: No results found for this basename: TSH,T4TOTAL,FREET4,T3FREE,THYROIDAB in the last 72 hours Anemia Panel: No results found for this basename: VITAMINB12,FOLATE,FERRITIN,TIBC,IRON,RETICCTPCT in the last 72 hours Coagulation: No results found for this basename: LABPROT:2,INR:2 in the last 72 hours Urine Drug Screen: Drugs of Abuse  No results found for this basename: labopia, cocainscrnur, labbenz, amphetmu, thcu, labbarb    Alcohol Level: No results found for this basename: ETH:2 in the last 72 hours Urinalysis: No results found for this basename: COLORURINE:2,APPERANCEUR:2,LABSPEC:2,PHURINE:2,GLUCOSEU:2,HGBUR:2,BILIRUBINUR:2,KETONESUR:2,PROTEINUR:2,UROBILINOGEN:2,NITRITE:2,LEUKOCYTESUR:2 in the last 72 hours Misc. Labs:  ABGS No results found for this basename: PHART,PCO2,PO2ART,TCO2,HCO3 in the last 72 hours CULTURES Recent Results (from the past 240 hour(s))  CLOSTRIDIUM DIFFICILE BY PCR     Status: Abnormal   Collection Time   11/05/11  8:29 AM      Component Value Range Status Comment   C difficile by pcr POSITIVE (*) NEGATIVE Final    Studies/Results: No results found.  Medications:  Prior to Admission:  Prescriptions prior to admission  Medication Sig Dispense Refill  . acetaminophen (TYLENOL) 500 MG tablet Take 500 mg by mouth every 6 (six) hours as needed. For pain       . albuterol (PROVENTIL HFA;VENTOLIN HFA) 108 (90 BASE) MCG/ACT inhaler Inhale 2 puffs into the lungs 2 (two) times daily as needed. Shortness of Breath      . atenolol (TENORMIN) 25 MG tablet Take 12.5 mg by mouth daily.        . Calcium Carbonate-Vitamin D (CALTRATE 600+D) 600-400 MG-UNIT per tablet Take 1 tablet by mouth daily.        . diphenoxylate-atropine (LOMOTIL) 2.5-0.025 MG  per tablet Take 1 tablet by mouth 4 (four) times daily as needed  for diarrhea or loose stools.  10 tablet  0  . escitalopram (LEXAPRO) 5 MG tablet Take 5 mg by mouth daily.        . ondansetron (ZOFRAN ODT) 4 MG disintegrating tablet 4mg  ODT q4 hours prn nausea/vomit  4 tablet  1  . pantoprazole (PROTONIX) 40 MG tablet Take 40 mg by mouth daily.        . simvastatin (ZOCOR) 40 MG tablet Take 40 mg by mouth at bedtime.        . vitamin E 200 UNIT capsule Take 200 Units by mouth daily.        Marland Kitchen zolpidem (AMBIEN) 10 MG tablet Take 5 mg by mouth at bedtime as needed. For sleep       Scheduled:   . albuterol  2.5 mg Nebulization Q4H WA  . atenolol  12.5 mg Oral Daily  . escitalopram  5 mg Oral Daily  . furosemide  40 mg Intravenous Once  . ipratropium  0.5 mg Nebulization Q4H WA  . predniSONE  20 mg Oral Q breakfast  . saccharomyces boulardii  250 mg Oral BID  . simvastatin  40 mg Oral QHS  . sodium chloride      . vancomycin  125 mg Oral QID   Continuous:   . sodium chloride 10 mL/hr (11/08/11 0829)   XBJ:[id number], acetaminophen, albuterol, albuterol, diphenoxylate-atropine, HYDROcodone-acetaminophen, HYDROcodone-homatropine, ipratropium, ondansetron (ZOFRAN) IV, ondansetron, zolpidem  Assesment: She has Clostridium difficile colitis. She has pretty severe COPD and a history of coronary artery occlusive disease. She was dehydrated on admission but that is improved. There is a question of congestive heart failure on her chest x-ray so she's going to have an echocardiogram. She has been hypoxic which is a new problem. She still having some difficulty with oxygenation Principal Problem:  *Clostridium difficile colitis Active Problems:  Dehydration  Hypokalemia  COPD (chronic obstructive pulmonary disease)  Coronary atherosclerosis  Anxiety  Depression    Plan: Check echocardiogram I'm going to hold on Lasix for the moment    LOS: 5 days   [name] L 11/09/2011, 8:30 AM

## 2013-09-15 ENCOUNTER — Emergency Department (HOSPITAL_COMMUNITY): Payer: Medicare Other

## 2013-09-15 ENCOUNTER — Encounter (HOSPITAL_COMMUNITY): Payer: Self-pay | Admitting: Emergency Medicine

## 2013-09-15 ENCOUNTER — Emergency Department (HOSPITAL_COMMUNITY)
Admission: EM | Admit: 2013-09-15 | Discharge: 2013-09-16 | Disposition: A | Discharge status: Home / Self Care | Payer: Medicare Other | Attending: Emergency Medicine | Admitting: Emergency Medicine

## 2013-09-15 DIAGNOSIS — Z792 Long term (current) use of antibiotics: Secondary | ICD-10-CM | POA: Insufficient documentation

## 2013-09-15 DIAGNOSIS — F3289 Other specified depressive episodes: Secondary | ICD-10-CM | POA: Diagnosis not present

## 2013-09-15 DIAGNOSIS — F411 Generalized anxiety disorder: Secondary | ICD-10-CM | POA: Diagnosis not present

## 2013-09-15 DIAGNOSIS — R1013 Epigastric pain: Secondary | ICD-10-CM | POA: Diagnosis not present

## 2013-09-15 DIAGNOSIS — R51 Headache: Secondary | ICD-10-CM | POA: Insufficient documentation

## 2013-09-15 DIAGNOSIS — F329 Major depressive disorder, single episode, unspecified: Secondary | ICD-10-CM | POA: Diagnosis not present

## 2013-09-15 DIAGNOSIS — Z9889 Other specified postprocedural states: Secondary | ICD-10-CM | POA: Diagnosis not present

## 2013-09-15 DIAGNOSIS — Z90710 Acquired absence of both cervix and uterus: Secondary | ICD-10-CM | POA: Diagnosis not present

## 2013-09-15 DIAGNOSIS — IMO0002 Reserved for concepts with insufficient information to code with codable children: Secondary | ICD-10-CM | POA: Insufficient documentation

## 2013-09-15 DIAGNOSIS — R42 Dizziness and giddiness: Secondary | ICD-10-CM | POA: Diagnosis not present

## 2013-09-15 DIAGNOSIS — K219 Gastro-esophageal reflux disease without esophagitis: Secondary | ICD-10-CM | POA: Insufficient documentation

## 2013-09-15 DIAGNOSIS — Z79899 Other long term (current) drug therapy: Secondary | ICD-10-CM | POA: Diagnosis not present

## 2013-09-15 DIAGNOSIS — E7800 Pure hypercholesterolemia, unspecified: Secondary | ICD-10-CM | POA: Insufficient documentation

## 2013-09-15 DIAGNOSIS — Z9089 Acquired absence of other organs: Secondary | ICD-10-CM | POA: Insufficient documentation

## 2013-09-15 DIAGNOSIS — I10 Essential (primary) hypertension: Secondary | ICD-10-CM | POA: Diagnosis not present

## 2013-09-15 DIAGNOSIS — R17 Unspecified jaundice: Secondary | ICD-10-CM | POA: Diagnosis not present

## 2013-09-15 DIAGNOSIS — R079 Chest pain, unspecified: Secondary | ICD-10-CM | POA: Diagnosis not present

## 2013-09-15 DIAGNOSIS — Z853 Personal history of malignant neoplasm of breast: Secondary | ICD-10-CM | POA: Diagnosis not present

## 2013-09-15 DIAGNOSIS — I2510 Atherosclerotic heart disease of native coronary artery without angina pectoris: Secondary | ICD-10-CM | POA: Insufficient documentation

## 2013-09-15 DIAGNOSIS — J441 Chronic obstructive pulmonary disease with (acute) exacerbation: Secondary | ICD-10-CM | POA: Diagnosis not present

## 2013-09-15 DIAGNOSIS — R1032 Left lower quadrant pain: Secondary | ICD-10-CM | POA: Diagnosis present

## 2013-09-15 DIAGNOSIS — F17200 Nicotine dependence, unspecified, uncomplicated: Secondary | ICD-10-CM | POA: Diagnosis not present

## 2013-09-15 LAB — CBC WITH DIFFERENTIAL/PLATELET
Basophils Absolute: 0 10*3/uL (ref 0.0–0.1)
Basophils Relative: 0 % (ref 0–1)
EOS ABS: 0.2 10*3/uL (ref 0.0–0.7)
Eosinophils Relative: 2 % (ref 0–5)
HEMATOCRIT: 40.5 % (ref 36.0–46.0)
HEMOGLOBIN: 13.8 g/dL (ref 12.0–15.0)
LYMPHS ABS: 1.3 10*3/uL (ref 0.7–4.0)
Lymphocytes Relative: 14 % (ref 12–46)
MCH: 32.5 pg (ref 26.0–34.0)
MCHC: 34.1 g/dL (ref 30.0–36.0)
MCV: 95.5 fL (ref 78.0–100.0)
MONOS PCT: 13 % — AB (ref 3–12)
Monocytes Absolute: 1.2 10*3/uL — ABNORMAL HIGH (ref 0.1–1.0)
Neutro Abs: 6.5 10*3/uL (ref 1.7–7.7)
Neutrophils Relative %: 71 % (ref 43–77)
Platelets: 131 10*3/uL — ABNORMAL LOW (ref 150–400)
RBC: 4.24 MIL/uL (ref 3.87–5.11)
RDW: 13.4 % (ref 11.5–15.5)
WBC: 9.2 10*3/uL (ref 4.0–10.5)

## 2013-09-15 LAB — URINALYSIS, ROUTINE W REFLEX MICROSCOPIC
BILIRUBIN URINE: NEGATIVE
GLUCOSE, UA: NEGATIVE mg/dL
HGB URINE DIPSTICK: NEGATIVE
Ketones, ur: NEGATIVE mg/dL
Leukocytes, UA: NEGATIVE
Nitrite: NEGATIVE
Protein, ur: NEGATIVE mg/dL
Specific Gravity, Urine: <1.005 — ABNORMAL LOW (ref 1.005–1.030)
UROBILINOGEN UA: 1 mg/dL (ref 0.0–1.0)
pH: 6.5 (ref 5.0–8.0)

## 2013-09-15 LAB — COMPREHENSIVE METABOLIC PANEL
ALBUMIN: 3.2 g/dL — AB (ref 3.5–5.2)
ALK PHOS: 115 U/L (ref 39–117)
ALT: 287 U/L — ABNORMAL HIGH (ref 0–35)
AST: 210 U/L — AB (ref 0–37)
Anion gap: 10 (ref 5–15)
BUN: 13 mg/dL (ref 6–23)
CO2: 28 mEq/L (ref 19–32)
Calcium: 9.2 mg/dL (ref 8.4–10.5)
Chloride: 99 mEq/L (ref 96–112)
Creatinine, Ser: 0.55 mg/dL (ref 0.50–1.10)
GFR calc Af Amer: >90 mL/min (ref >90)
GFR calc non Af Amer: 83 mL/min — ABNORMAL LOW (ref >90)
Glucose, Bld: 118 mg/dL — ABNORMAL HIGH (ref 70–99)
POTASSIUM: 3.4 meq/L — AB (ref 3.7–5.3)
SODIUM: 137 meq/L (ref 137–147)
Total Bilirubin: 2.6 mg/dL — ABNORMAL HIGH (ref 0.3–1.2)
Total Protein: 6.6 g/dL (ref 6.0–8.3)

## 2013-09-15 LAB — TROPONIN I

## 2013-09-15 LAB — LIPASE, BLOOD: Lipase: 25 U/L (ref 11–59)

## 2013-09-15 MED ORDER — SODIUM CHLORIDE 0.9 % IV SOLN: INTRAVENOUS | Status: DC

## 2013-09-15 MED ORDER — SODIUM CHLORIDE 0.9 % IV SOLN
Freq: Once | INTRAVENOUS | Status: AC
  Administered 2013-09-15: 17:00:00 via INTRAVENOUS

## 2013-09-15 MED ORDER — ONDANSETRON HCL 4 MG/2ML IJ SOLN
4.0000 mg | Freq: Once | INTRAMUSCULAR | Status: AC
  Administered 2013-09-15: 4 mg via INTRAMUSCULAR
  Filled 2013-09-15: qty 2

## 2013-09-15 MED ORDER — ONDANSETRON HCL 4 MG/2ML IJ SOLN
4.0000 mg | Freq: Once | INTRAMUSCULAR | Status: AC
  Administered 2013-09-15: 4 mg via INTRAVENOUS
  Filled 2013-09-15: qty 2

## 2013-09-15 NOTE — ED Notes (Signed)
Pt gone over to MRI and CT

## 2013-09-15 NOTE — ED Notes (Addendum)
Mid abdominal pain with vomiting x 1 began Monday.  Felt ill Tues/Weds but w/out the pain.  Pain returned Thursday w/vomiting and has worsened since then.  Took NTG tab on Monday and Thursday which seemed to help.  Patient thinks pain could be from her hiatal hernia.  States pain begins in mid abdomen and radiates up into chest.  Causes nausea and vomiting.  She has been very fatigued and feeling weak, frequently dropping things she picks up.  States episodes have been worse since being changed from Zocor to Lipitor.

## 2013-09-15 NOTE — ED Notes (Signed)
Pt back from MRI and is currently drinking contrast CT; pt's family is back and room and updated on pt's situation

## 2013-09-15 NOTE — ED Notes (Signed)
Pt assisted to bathroom and back to bed; urine specimen obtained and sent to lab. Family at bedside

## 2013-09-15 NOTE — ED Notes (Signed)
Pt assisted to bathroom and back to bed

## 2013-09-15 NOTE — ED Notes (Signed)
Pt back from CT

## 2013-09-15 NOTE — ED Provider Notes (Addendum)
CSN: [id number]     Arrival date & time 09/15/13  1602 History  This chart was scribed for Fredia Sorrow, MD by Einar Pheasant, ED Scribe. This patient was seen in room [facility]07/[facility]07 and the patient's care was started at 5:56 PM.    Chief Complaint  Patient presents with  . Abdominal Pain  . Chest Pain   Patient is a 80 y.o. female presenting with abdominal pain and chest pain. The history is provided by the patient. No language interpreter was used.  Abdominal Pain Pain location:  RUQ Pain quality: cramping   Pain radiates to:  Chest (radiated twice to her chest) Pain severity:  Moderate Onset quality:  Gradual Duration:  5 days Timing:  Constant Progression:  Unchanged Chronicity:  New Relieved by:  Nothing Worsened by:  Nothing tried Ineffective treatments:  None tried Associated symptoms: chest pain, chills (last night) and shortness of breath (mild)   Associated symptoms: no cough, no diarrhea, no dysuria and no fever   Chest Pain Associated symptoms: abdominal pain, headache and shortness of breath (mild)   Associated symptoms: no back pain, no cough and no fever    HPI Comments: [name] is a 80 y.o. female who presents to the Emergency Department complaining of worsening upper quadrant abdominal pain that started 5 days ago. She states that the pain radiates up into the chest. Pt reports two episodes of radiating pain to the chest that lasted for 15 minutes with associated 2 episodes of emesis, which resolved after taking nitroglycerins. The first episode of radiating pain to the chest was on Monday and last one this morning at 10 AM. She describes the abdominal pain as "cramping" in nature. Currently, she states that the pain is no longer radiating to her chest but it is localized to the  upper quadrant of her abdomen, which she has experienced all day today. She reports 1 episode of loose bowel movement with no trace of blood. Pt has a hx of coronary artery disease, with  placement of 2 stents. She denies any past similar history of her current symptoms.   Pt has history of weakness and difficulty walking, but today she endorses that her current symptoms have been worsening her weakness. Son reports that the pt has been complaining of dropping object with both of her arms. Pt states that over the course of her symptoms she felt light headed, like she was going to pass out. Daughter states that the reason why she brought the pt into the ED is because she noticed that the pt was slurring her words and her weakness had been increase (in reference of dropping things). Pt has a hx of Vertigo. Currently, daughter states that her speech is much better now. Pt also has a hx of hiatal hernia, with which she experiences abdominal pain but it has never gotten this bad. Denies any hematemesis or diarrhea.   Pt has 2 liter Oxygen at home, which she mostly uses at night. Pt denies being on any blood thinners. No hx of bleeding easily.  Pt is allergic to the IV CT scan contrast. Pt does have a hx of abdominal surgery.  PCP: Alonza Bogus, MD   Past Medical History  Diagnosis Date  . Hypertension   . Hypercholesteremia   . Coronary artery disease     2 stents per patient  . COPD (chronic obstructive pulmonary disease)   . Anxiety   . Depression   . Breast cancer 2003  treated with surgery and Arimadex  . Ischemic colitis 2009  . Dilated bile duct     noted on CT 2009, ampulla looked normal on EGD  . Aneurysm     thoracic aorta   Past Surgical History  Procedure Laterality Date  . Abdominal hysterectomy    . Hernia repair    . Cholecystectomy    . Breast surgery      left partial mastectomy 04/15/11, wide excision left breast  04/21/11, left partial mastectomy 12/19/02  . Appendectomy    . Colonoscopy  05/2007    Dr. Perfecto Kingdom, ulcerated, edematous area at 35-40cm from anal verge bx c/w ischemic colitis, frequent sigm tics, small internal hemrrhoids.  SMA/IMA wide open on CT at that time.   . Esophagogastroduodenoscopy  05/2007    Dr. Malon Kindle ring, 2-3 cm HH, streaky erythema antrum with superficial erosions, bx gastritis without H.Pylori   Family History  Problem Relation Age of Onset  . Breast cancer Daughter   . Breast cancer Mother   . Breast cancer Sister   . Colon cancer Neg Hx    History  Substance Use Topics  . Smoking status: Current Some Day Smoker  . Smokeless tobacco: Not on file  . Alcohol Use: No   OB History   Grav Para Term Preterm Abortions TAB SAB Ect Mult Living                 Review of Systems  Constitutional: Positive for chills (last night). Negative for fever.  HENT: Positive for congestion (nasal congestion). Negative for rhinorrhea.   Eyes: Negative for visual disturbance.  Respiratory: Positive for shortness of breath (mild). Negative for cough.   Cardiovascular: Positive for chest pain. Negative for leg swelling.  Gastrointestinal: Positive for abdominal pain. Negative for diarrhea.  Genitourinary: Negative for dysuria.  Musculoskeletal: Negative for back pain and joint swelling.  Skin: Negative for rash.  Neurological: Positive for light-headedness and headaches.  Psychiatric/Behavioral: Negative for confusion.   Allergies  Ceftin and Iohexol  Home Medications   Prior to Admission medications   Medication Sig Start Date End Date Taking? Authorizing Provider  albuterol (PROVENTIL HFA;VENTOLIN HFA) 108 (90 BASE) MCG/ACT inhaler Inhale 2 puffs into the lungs 2 (two) times daily as needed. Shortness of Breath   Yes Historical Provider, MD  atenolol (TENORMIN) 25 MG tablet Take 25 mg by mouth every morning.    Yes Historical Provider, MD  atorvastatin (LIPITOR) 40 MG tablet Take 40 mg by mouth daily with supper.   Yes Historical Provider, MD  escitalopram (LEXAPRO) 5 MG tablet Take 5 mg by mouth daily.     Yes Historical Provider, MD  LORazepam (ATIVAN) 0.5 MG tablet Take 0.5 mg  by mouth 3 (three) times daily as needed for anxiety.   Yes Historical Provider, MD  nitroGLYCERIN (NITROSTAT) 0.4 MG SL tablet Place 0.4 mg under the tongue every 5 (five) minutes as needed for chest pain.   Yes Historical Provider, MD  pantoprazole (PROTONIX) 40 MG tablet Take 40 mg by mouth at bedtime.    Yes Historical Provider, MD  ondansetron (ZOFRAN ODT) 4 MG disintegrating tablet Take 1 tablet (4 mg total) by mouth every 8 (eight) hours as needed. 09/16/13   Fredia Sorrow, MD  traMADol (ULTRAM) 50 MG tablet Take 1 tablet (50 mg total) by mouth every 6 (six) hours as needed. 09/16/13   Fredia Sorrow, MD   Triage Vitals: BP 108/52  Pulse 71  Temp(Src) 98.2 F (36.8  C) (Oral)  Resp 18  Ht 5\' 7"  (1.702 m)  Wt 155 lb (70.308 kg)  BMI 24.27 kg/m2  SpO2 94%  Physical Exam  Nursing note and vitals reviewed. Constitutional: She is oriented to person, place, and time. She appears well-developed and well-nourished. No distress.  HENT:  Head: Normocephalic and atraumatic.  Eyes: Conjunctivae and EOM are normal.  Neck: Neck supple.  Cardiovascular: Normal rate, regular rhythm and normal heart sounds.  Exam reveals no gallop and no friction rub.   No murmur heard. Pulses:      Dorsalis pedis pulses are 2+ on the right side, and 2+ on the left side.  Pulmonary/Chest: Effort normal and breath sounds normal. No respiratory distress. She has no wheezes.  Abdominal: Soft. Bowel sounds are normal. She exhibits no distension. There is tenderness. There is no rebound.  Tenderness to epigastric region and RUQ, but no guarding.   Musculoskeletal: Normal range of motion. She exhibits no edema.  Neurological: She is alert and oriented to person, place, and time. No cranial nerve deficit. She exhibits normal muscle tone. Coordination normal.  Some pronator drift.   Skin: Skin is warm and dry. No rash noted.  Psychiatric: She has a normal mood and affect. Her behavior is normal.    ED Course   Procedures (including critical care time)  Medications  0.9 %  sodium chloride infusion (not administered)  0.9 %  sodium chloride infusion ( Intravenous New Bag/Given 09/15/13 1721)  ondansetron (ZOFRAN) injection 4 mg (4 mg Intramuscular Given 09/15/13 1725)  ondansetron (ZOFRAN) injection 4 mg (4 mg Intravenous Given 09/15/13 1913)    Results for orders placed during the hospital encounter of 09/15/13  TROPONIN I      Result Value Ref Range   Troponin I <0.30  <0.30 ng/mL  COMPREHENSIVE METABOLIC PANEL      Result Value Ref Range   Sodium 137  137 - 147 mEq/L   Potassium 3.4 (*) 3.7 - 5.3 mEq/L   Chloride 99  96 - 112 mEq/L   CO2 28  19 - 32 mEq/L   Glucose, Bld 118 (*) 70 - 99 mg/dL   BUN 13  6 - 23 mg/dL   Creatinine, Ser 0.55  0.50 - 1.10 mg/dL   Calcium 9.2  8.4 - 10.5 mg/dL   Total Protein 6.6  6.0 - 8.3 g/dL   Albumin 3.2 (*) 3.5 - 5.2 g/dL   AST 210 (*) 0 - 37 U/L   ALT 287 (*) 0 - 35 U/L   Alkaline Phosphatase 115  39 - 117 U/L   Total Bilirubin 2.6 (*) 0.3 - 1.2 mg/dL   GFR calc non Af Amer 83 (*) >90 mL/min   GFR calc Af Amer >90  >90 mL/min   Anion gap 10  5 - 15  LIPASE, BLOOD      Result Value Ref Range   Lipase 25  11 - 59 U/L  CBC WITH DIFFERENTIAL      Result Value Ref Range   WBC 9.2  4.0 - 10.5 K/uL   RBC 4.24  3.87 - 5.11 MIL/uL   Hemoglobin 13.8  12.0 - 15.0 g/dL   HCT 40.5  36.0 - 46.0 %   MCV 95.5  78.0 - 100.0 fL   MCH 32.5  26.0 - 34.0 pg   MCHC 34.1  30.0 - 36.0 g/dL   RDW 13.4  11.5 - 15.5 %   Platelets 131 (*) 150 - 400 K/uL  Neutrophils Relative % 71  43 - 77 %   Neutro Abs 6.5  1.7 - 7.7 K/uL   Lymphocytes Relative 14  12 - 46 %   Lymphs Abs 1.3  0.7 - 4.0 K/uL   Monocytes Relative 13 (*) 3 - 12 %   Monocytes Absolute 1.2 (*) 0.1 - 1.0 K/uL   Eosinophils Relative 2  0 - 5 %   Eosinophils Absolute 0.2  0.0 - 0.7 K/uL   Basophils Relative 0  0 - 1 %   Basophils Absolute 0.0  0.0 - 0.1 K/uL  URINALYSIS, ROUTINE W REFLEX MICROSCOPIC       Result Value Ref Range   Color, Urine YELLOW  YELLOW   APPearance CLEAR  CLEAR   Specific Gravity, Urine <1.005 (*) 1.005 - 1.030   pH 6.5  5.0 - 8.0   Glucose, UA NEGATIVE  NEGATIVE mg/dL   Hgb urine dipstick NEGATIVE  NEGATIVE   Bilirubin Urine NEGATIVE  NEGATIVE   Ketones, ur NEGATIVE  NEGATIVE mg/dL   Protein, ur NEGATIVE  NEGATIVE mg/dL   Urobilinogen, UA 1.0  0.0 - 1.0 mg/dL   Nitrite NEGATIVE  NEGATIVE   Leukocytes, UA NEGATIVE  NEGATIVE   No results found. Results for orders placed during the hospital encounter of 09/15/13  TROPONIN I      Result Value Ref Range   Troponin I <0.30  <0.30 ng/mL  COMPREHENSIVE METABOLIC PANEL      Result Value Ref Range   Sodium 137  137 - 147 mEq/L   Potassium 3.4 (*) 3.7 - 5.3 mEq/L   Chloride 99  96 - 112 mEq/L   CO2 28  19 - 32 mEq/L   Glucose, Bld 118 (*) 70 - 99 mg/dL   BUN 13  6 - 23 mg/dL   Creatinine, Ser 0.55  0.50 - 1.10 mg/dL   Calcium 9.2  8.4 - 10.5 mg/dL   Total Protein 6.6  6.0 - 8.3 g/dL   Albumin 3.2 (*) 3.5 - 5.2 g/dL   AST 210 (*) 0 - 37 U/L   ALT 287 (*) 0 - 35 U/L   Alkaline Phosphatase 115  39 - 117 U/L   Total Bilirubin 2.6 (*) 0.3 - 1.2 mg/dL   GFR calc non Af Amer 83 (*) >90 mL/min   GFR calc Af Amer >90  >90 mL/min   Anion gap 10  5 - 15  LIPASE, BLOOD      Result Value Ref Range   Lipase 25  11 - 59 U/L  CBC WITH DIFFERENTIAL      Result Value Ref Range   WBC 9.2  4.0 - 10.5 K/uL   RBC 4.24  3.87 - 5.11 MIL/uL   Hemoglobin 13.8  12.0 - 15.0 g/dL   HCT 40.5  36.0 - 46.0 %   MCV 95.5  78.0 - 100.0 fL   MCH 32.5  26.0 - 34.0 pg   MCHC 34.1  30.0 - 36.0 g/dL   RDW 13.4  11.5 - 15.5 %   Platelets 131 (*) 150 - 400 K/uL   Neutrophils Relative % 71  43 - 77 %   Neutro Abs 6.5  1.7 - 7.7 K/uL   Lymphocytes Relative 14  12 - 46 %   Lymphs Abs 1.3  0.7 - 4.0 K/uL   Monocytes Relative 13 (*) 3 - 12 %   Monocytes Absolute 1.2 (*) 0.1 - 1.0 K/uL   Eosinophils Relative 2  0 -  5 %   Eosinophils Absolute  0.2  0.0 - 0.7 K/uL   Basophils Relative 0  0 - 1 %   Basophils Absolute 0.0  0.0 - 0.1 K/uL  URINALYSIS, ROUTINE W REFLEX MICROSCOPIC      Result Value Ref Range   Color, Urine YELLOW  YELLOW   APPearance CLEAR  CLEAR   Specific Gravity, Urine <1.005 (*) 1.005 - 1.030   pH 6.5  5.0 - 8.0   Glucose, UA NEGATIVE  NEGATIVE mg/dL   Hgb urine dipstick NEGATIVE  NEGATIVE   Bilirubin Urine NEGATIVE  NEGATIVE   Ketones, ur NEGATIVE  NEGATIVE mg/dL   Protein, ur NEGATIVE  NEGATIVE mg/dL   Urobilinogen, UA 1.0  0.0 - 1.0 mg/dL   Nitrite NEGATIVE  NEGATIVE   Leukocytes, UA NEGATIVE  NEGATIVE   Ct Abdomen Pelvis Wo Contrast  09/16/2013   CLINICAL DATA:  Mid abdominal pain and vomiting beginning on Monday. Pain returned on Thursday and is worsening since then. Fatigue and weakness.  EXAM: CT ABDOMEN AND PELVIS WITHOUT CONTRAST  TECHNIQUE: Multidetector CT imaging of the abdomen and pelvis was performed following the standard protocol without IV contrast.  COMPARISON:  11/02/2011  FINDINGS: Atelectasis and honeycomb scarring in the lung bases. Mild cardiac enlargement. Coronary artery calcifications. Calcification of the aorta. Small esophageal hiatal hernia. Contrast material in the lower esophagus suggest reflux or dysmotility. There is a small fat containing lesions in or adjacent to the lower esophageal wall likely representing a lipoma.  Surgical absence of the gallbladder. The unenhanced appearance of the liver, spleen, pancreas, adrenal glands, inferior vena cava, and retroperitoneal lymph nodes is unremarkable. Calcification throughout the abdominal aorta and branch vessels without aneurysm. Multiple calcifications within both kidneys could represent parenchymal stones or dystrophic parenchymal calcifications. Indeterminate 15 mm exophytic lesion off of the lower pole of the left kidney is unchanged since prior study. Stomach and small bowel are not abnormally distended and no wall thickening is  appreciated. Contrast material flows through to the colon without evidence of obstruction. Small periumbilical hernias containing fat. No free air or free fluid in the abdomen.  Pelvis: Diverticulosis of sigmoid colon without evidence of diverticulitis. Appendix is not identified. No free or loculated pelvic fluid collections. Bladder is decompressed. No pelvic mass or lymphadenopathy. Uterus appears to be surgically absent. Degenerative changes in the spine. No destructive bone lesions are appreciated.  IMPRESSION: No acute process demonstrated in the abdomen or pelvis. Again demonstrated are multiple intrarenal calcifications which could represent stones or dystrophic parenchymal calcification. No evidence of ureteral obstruction.   Electronically Signed   By: Lucienne Capers M.D.   On: 09/16/2013 00:07   Ct Head Wo Contrast  09/15/2013   CLINICAL DATA:  Slurred speech  EXAM: CT HEAD WITHOUT CONTRAST  TECHNIQUE: Contiguous axial images were obtained from the base of the skull through the vertex without intravenous contrast.  COMPARISON:  None.  FINDINGS: No evidence of parenchymal hemorrhage or extra-axial fluid collection. No mass lesion, mass effect, or midline shift.  No CT evidence of acute infarction.  Suspected chronic lacunar infarct in the right external capsule (series 2/image 16).  Subcortical white matter and periventricular small vessel ischemic changes.  Global cortical atrophy.  No ventriculomegaly.  Near complete opacification of the right maxillary sinus. Partial opacification of the bilateral ethmoid sinuses. Mastoid air cells are clear.  No evidence of calvarial fracture.  IMPRESSION: No evidence of acute intracranial abnormality.  Suspected old chronic lacunar infarct in  the right external capsule.  Atrophy with small vessel ischemic changes.   Electronically Signed   By: Julian Hy M.D.   On: 09/15/2013 19:58   Mr Brain Wo Contrast  09/15/2013   CLINICAL DATA:  Slurred speech.   EXAM: MRI HEAD WITHOUT CONTRAST  TECHNIQUE: Multiplanar, multiecho pulse sequences of the brain and surrounding structures were obtained without intravenous contrast.  COMPARISON:  Head CT 09/15/2013  FINDINGS: Images are mildly to moderately degraded by motion artifact despite repeating some sequences. Patient is severely claustrophobic.  There is no evidence of acute infarct, intracranial hemorrhage, mass, midline shift, or extra-axial fluid collection. Patchy T2 hyperintensities in the subcortical and deep cerebral white matter and pons are nonspecific but compatible with moderate chronic small vessel ischemic disease. There is mild-to-moderate generalized cerebral atrophy.  Prior bilateral cataract extraction is noted. There is partial opacification of right greater the left ethmoid air cells, and there is near complete opacification of the right maxillary sinus. Mastoid air cells are clear. Major intracranial vascular flow voids are grossly preserved.  IMPRESSION: 1. No acute intracranial abnormality. 2. Moderate chronic small vessel ischemic disease.   Electronically Signed   By: Logan Bores   On: 09/15/2013 21:40   Dg Chest Port 1 View  09/15/2013   CLINICAL DATA:  Chest pain.  Abdominal pain.  EXAM: PORTABLE CHEST - 1 VIEW  COMPARISON:  11/04/2011  FINDINGS: Cardiomegaly stable. Pulmonary hyperinflation again demonstrated. Diffuse interstitial prominence shows no significant change, consistent with chronic interstitial lung disease. No evidence of pulmonary consolidation or pleural effusion. Enlarged pulmonary arteries again demonstrated, suspicious for pulmonary arterial hypertension.  IMPRESSION: No acute findings.  Stable pulmonary hyperinflation, and chronic interstitial lung disease.  Stable cardiomegaly and enlarged pulmonary arteries suspicious for pulmonary arterial hypertension.   Electronically Signed   By: Earle Gell M.D.   On: 09/15/2013 20:00     6:26 PM- Will order a head CT scan and  abdomen. Pt and family advised of plan for treatment and pt and family agree.  Labs Review Labs Reviewed  COMPREHENSIVE METABOLIC PANEL - Abnormal; Notable for the following:    Potassium 3.4 (*)    Glucose, Bld 118 (*)    Albumin 3.2 (*)    AST 210 (*)    ALT 287 (*)    Total Bilirubin 2.6 (*)    GFR calc non Af Amer 83 (*)    All other components within normal limits  CBC WITH DIFFERENTIAL - Abnormal; Notable for the following:    Platelets 131 (*)    Monocytes Relative 13 (*)    Monocytes Absolute 1.2 (*)    All other components within normal limits  URINALYSIS, ROUTINE W REFLEX MICROSCOPIC - Abnormal; Notable for the following:    Specific Gravity, Urine <1.005 (*)    All other components within normal limits  TROPONIN I  LIPASE, BLOOD    Imaging Review Ct Abdomen Pelvis Wo Contrast  09/16/2013   CLINICAL DATA:  Mid abdominal pain and vomiting beginning on Monday. Pain returned on Thursday and is worsening since then. Fatigue and weakness.  EXAM: CT ABDOMEN AND PELVIS WITHOUT CONTRAST  TECHNIQUE: Multidetector CT imaging of the abdomen and pelvis was performed following the standard protocol without IV contrast.  COMPARISON:  11/02/2011  FINDINGS: Atelectasis and honeycomb scarring in the lung bases. Mild cardiac enlargement. Coronary artery calcifications. Calcification of the aorta. Small esophageal hiatal hernia. Contrast material in the lower esophagus suggest reflux or dysmotility. There is a  small fat containing lesions in or adjacent to the lower esophageal wall likely representing a lipoma.  Surgical absence of the gallbladder. The unenhanced appearance of the liver, spleen, pancreas, adrenal glands, inferior vena cava, and retroperitoneal lymph nodes is unremarkable. Calcification throughout the abdominal aorta and branch vessels without aneurysm. Multiple calcifications within both kidneys could represent parenchymal stones or dystrophic parenchymal calcifications.  Indeterminate 15 mm exophytic lesion off of the lower pole of the left kidney is unchanged since prior study. Stomach and small bowel are not abnormally distended and no wall thickening is appreciated. Contrast material flows through to the colon without evidence of obstruction. Small periumbilical hernias containing fat. No free air or free fluid in the abdomen.  Pelvis: Diverticulosis of sigmoid colon without evidence of diverticulitis. Appendix is not identified. No free or loculated pelvic fluid collections. Bladder is decompressed. No pelvic mass or lymphadenopathy. Uterus appears to be surgically absent. Degenerative changes in the spine. No destructive bone lesions are appreciated.  IMPRESSION: No acute process demonstrated in the abdomen or pelvis. Again demonstrated are multiple intrarenal calcifications which could represent stones or dystrophic parenchymal calcification. No evidence of ureteral obstruction.   Electronically Signed   By: Lucienne Capers M.D.   On: 09/16/2013 00:07   Ct Head Wo Contrast  09/15/2013   CLINICAL DATA:  Slurred speech  EXAM: CT HEAD WITHOUT CONTRAST  TECHNIQUE: Contiguous axial images were obtained from the base of the skull through the vertex without intravenous contrast.  COMPARISON:  None.  FINDINGS: No evidence of parenchymal hemorrhage or extra-axial fluid collection. No mass lesion, mass effect, or midline shift.  No CT evidence of acute infarction.  Suspected chronic lacunar infarct in the right external capsule (series 2/image 16).  Subcortical white matter and periventricular small vessel ischemic changes.  Global cortical atrophy.  No ventriculomegaly.  Near complete opacification of the right maxillary sinus. Partial opacification of the bilateral ethmoid sinuses. Mastoid air cells are clear.  No evidence of calvarial fracture.  IMPRESSION: No evidence of acute intracranial abnormality.  Suspected old chronic lacunar infarct in the right external capsule.  Atrophy  with small vessel ischemic changes.   Electronically Signed   By: Julian Hy M.D.   On: 09/15/2013 19:58   Mr Brain Wo Contrast  09/15/2013   CLINICAL DATA:  Slurred speech.  EXAM: MRI HEAD WITHOUT CONTRAST  TECHNIQUE: Multiplanar, multiecho pulse sequences of the brain and surrounding structures were obtained without intravenous contrast.  COMPARISON:  Head CT 09/15/2013  FINDINGS: Images are mildly to moderately degraded by motion artifact despite repeating some sequences. Patient is severely claustrophobic.  There is no evidence of acute infarct, intracranial hemorrhage, mass, midline shift, or extra-axial fluid collection. Patchy T2 hyperintensities in the subcortical and deep cerebral white matter and pons are nonspecific but compatible with moderate chronic small vessel ischemic disease. There is mild-to-moderate generalized cerebral atrophy.  Prior bilateral cataract extraction is noted. There is partial opacification of right greater the left ethmoid air cells, and there is near complete opacification of the right maxillary sinus. Mastoid air cells are clear. Major intracranial vascular flow voids are grossly preserved.  IMPRESSION: 1. No acute intracranial abnormality. 2. Moderate chronic small vessel ischemic disease.   Electronically Signed   By: Logan Bores   On: 09/15/2013 21:40   Dg Chest Port 1 View  09/15/2013   CLINICAL DATA:  Chest pain.  Abdominal pain.  EXAM: PORTABLE CHEST - 1 VIEW  COMPARISON:  11/04/2011  FINDINGS: Cardiomegaly  stable. Pulmonary hyperinflation again demonstrated. Diffuse interstitial prominence shows no significant change, consistent with chronic interstitial lung disease. No evidence of pulmonary consolidation or pleural effusion. Enlarged pulmonary arteries again demonstrated, suspicious for pulmonary arterial hypertension.  IMPRESSION: No acute findings.  Stable pulmonary hyperinflation, and chronic interstitial lung disease.  Stable cardiomegaly and enlarged  pulmonary arteries suspicious for pulmonary arterial hypertension.   Electronically Signed   By: Earle Gell M.D.   On: 09/15/2013 20:00     EKG Interpretation   Date/Time:  Friday September 15 2013 16:11:45 EDT Ventricular Rate:  70 PR Interval:  206 QRS Duration: 82 QT Interval:  390 QTC Calculation: 421 R Axis:   63 Text Interpretation:  Normal sinus rhythm Normal ECG No significant change  since last tracing Confirmed by Kastin Cerda  MD, Raha Tennison [phone]) on 09/15/2013  5:34:56 PM      MDM   Final diagnoses:  Epigastric pain  Gastroesophageal reflux disease, esophagitis presence not specified  Hyperbilirubinemia    Patient with extensive workup. There were 2 concerns. Patient's daughter was concerned about the possibility of a stroke. Patient had some bilateral hand weakness difficulty holding onto things earlier today. No focal deficit currently. There was some question of whether there was some slurred speech. None of the findings were we'll significant heart core. All agree that they're resolved now. Head CT was negative MRI of the brain was negative. Patient has had strokes in the past.  The other concern was for the epigastric abdominal pain that radiated into the chest. Been present on Monday and then present yesterday and today. Troponin was negative patient does have stents EKG had no acute changes. No evidence of acute cardiac event. Patient did have elevation in her liver function tests lipase was normal not consistent with pancreatitis. Her transaminases were in the 200 range and her total bili is 2.6. This is a new finding. Patient has had her gallbladder removed CT scan without IV contrast due to allergy showed some reflux disease showed no other significant abnormalities. Patient will be referred to GI medicine for further evaluation and back to her primary care Dr. Patient improved significantly in the emergency department feeling much better.  In addition patient's liver  function test abnormalities may be related to statin drugs.  I personally performed the services described in this documentation, which was scribed in my presence. The recorded information has been reviewed and is accurate.    Fredia Sorrow, MD 09/16/13 [id number]  Fredia Sorrow, MD 09/16/13 [id number]  Fredia Sorrow, MD 09/16/13 [phone]

## 2013-09-15 NOTE — ED Notes (Signed)
Due to oxygen saturations in the mid 70's, pt was given oxygen through nasal cannula at 2 liters.

## 2013-09-16 MED ORDER — ONDANSETRON 4 MG PO TBDP: 4.0000 mg | ORAL_TABLET | Freq: Three times a day (TID) | ORAL | Status: DC | PRN

## 2013-09-16 MED ORDER — TRAMADOL HCL 50 MG PO TABS: 50.0000 mg | ORAL_TABLET | Freq: Four times a day (QID) | ORAL | Status: DC | PRN

## 2013-09-16 NOTE — Discharge Instructions (Signed)
As we discussed MRI of the brain without evidence of acute stroke. CT of the abdomen without significant findings other than reflux disease. Continue your Protonix. Have prescribed an antinausea medicine Zofran and a mild pain medicine tramadol. For the liver function test abnormalities that we discussed followup with Dr. Luan Pulling and also a referral to GI medicine has been provided. Return for any newer worse symptoms.

## 2013-09-29 ENCOUNTER — Ambulatory Visit (INDEPENDENT_AMBULATORY_CARE_PROVIDER_SITE_OTHER): Payer: Medicare Other | Admitting: Gastroenterology

## 2013-09-29 ENCOUNTER — Encounter: Payer: Self-pay | Admitting: Gastroenterology

## 2013-09-29 VITALS — BP 111/68 | HR 70 | Temp 97.6°F | Ht 62.0 in | Wt 153.4 lb

## 2013-09-29 DIAGNOSIS — R1011 Right upper quadrant pain: Secondary | ICD-10-CM

## 2013-09-29 DIAGNOSIS — R7989 Other specified abnormal findings of blood chemistry: Secondary | ICD-10-CM

## 2013-09-29 DIAGNOSIS — R945 Abnormal results of liver function studies: Secondary | ICD-10-CM

## 2013-09-29 NOTE — Patient Instructions (Signed)
1. Call Dr. Luan Pulling or [place] if you have recurrent abdominal pain. Would recommend rechecking your liver blood work at that time. 2. As discussed today, MRI of your liver and bile duct has been recommended but we will not perform per your request. If you have recurrent abdominal pain and want to pursue MRI, call us.  3. Discussed with Dr. Luan Pulling, he said you could go back on your Zocor. He would like for you to make a follow up appointment with him.

## 2013-09-29 NOTE — Progress Notes (Signed)
Primary Care Physician:  Alonza Bogus, MD  Primary Gastroenterologist:  Barney Drain, MD   Chief Complaint  Patient presents with  . Nausea  . Emesis    HPI:  [name] is a 80 y.o. female here for further evaluation of N/V, RUQ pain. Recent ER evaluation. States symptoms began 2-3 weeks after starting Lipitor. Had recently switched from Zocor because she thought it was causing weakness, leg pain. Noted to have bump in LFTs (previously normal), unremarkable NONCONTRAST CT A/P. Several days after stopping the lipitor, her LFTs were improved.   Currently states her pain is basically resolved. No further N/V. Denies heartburn. BMs ok. No melena, brbpr. She is not interested in any more xrays/MRIs. Doesn't want to add to her bill.          Current Outpatient Prescriptions  Medication Sig Dispense Refill  . albuterol (PROVENTIL HFA;VENTOLIN HFA) 108 (90 BASE) MCG/ACT inhaler Inhale 2 puffs into the lungs 2 (two) times daily as needed. Shortness of Breath      . atenolol (TENORMIN) 25 MG tablet Take 12.5 mg by mouth every morning.       . escitalopram (LEXAPRO) 5 MG tablet Take 5 mg by mouth daily.        Marland Kitchen LORazepam (ATIVAN) 0.5 MG tablet Take 0.5 mg by mouth at bedtime.       . nitroGLYCERIN (NITROSTAT) 0.4 MG SL tablet Place 0.4 mg under the tongue every 5 (five) minutes as needed for chest pain.      . pantoprazole (PROTONIX) 40 MG tablet Take 40 mg by mouth at bedtime.        No current facility-administered medications for this visit.    Allergies as of 09/29/2013 - Review Complete 09/29/2013  Allergen Reaction Noted  . Ceftin [cefuroxime axetil] Anaphylaxis 11/02/2011  . Iohexol Hives and Shortness Of Breath 11/02/2011    Past Medical History  Diagnosis Date  . Hypertension   . Hypercholesteremia   . Coronary artery disease     2 stents per patient  . COPD (chronic obstructive pulmonary disease)   . Anxiety   . Depression   . Breast cancer 2003    treated with  surgery and Arimadex  . Ischemic colitis 2009  . Dilated bile duct     noted on CT 2009, ampulla looked normal on EGD  . Aneurysm     thoracic aorta    Past Surgical History  Procedure Laterality Date  . Abdominal hysterectomy    . Hernia repair    . Cholecystectomy    . Breast surgery      left partial mastectomy 04/15/11, wide excision left breast  04/21/11, left partial mastectomy 12/19/02  . Appendectomy    . Colonoscopy  05/2007    Dr. Perfecto Kingdom, ulcerated, edematous area at 35-40cm from anal verge bx c/w ischemic colitis, frequent sigm tics, small internal hemrrhoids. SMA/IMA wide open on CT at that time.   . Esophagogastroduodenoscopy  05/2007    Dr. Malon Kindle ring, 2-3 cm HH, streaky erythema antrum with superficial erosions, bx gastritis without H.Pylori    Family History  Problem Relation Age of Onset  . Breast cancer Daughter   . Breast cancer Mother   . Breast cancer Sister   . Colon cancer Neg Hx     History   Social History  . Marital Status: Widowed    Spouse Name: N/A    Number of Children: 3  . Years of Education: N/A   Occupational  History  . Not on file.   Social History Main Topics  . Smoking status: Current Some Day Smoker  . Smokeless tobacco: Not on file  . Alcohol Use: No  . Drug Use: No  . Sexual Activity: Yes    Birth Control/ Protection: Post-menopausal, Surgical   Other Topics Concern  . Not on file   Social History Narrative  . No narrative on file      ROS:  General: Negative for anorexia, weight loss, fever, chills, fatigue, weakness. Eyes: Negative for vision changes.  ENT: Negative for hoarseness, difficulty swallowing , nasal congestion. CV: Negative for chest pain, angina, palpitations, dyspnea on exertion, peripheral edema.  Respiratory: Negative for dyspnea at rest, dyspnea on exertion, cough, sputum, wheezing.  GI: See history of present illness. GU:  Negative for dysuria, hematuria, urinary  incontinence, urinary frequency, nocturnal urination.  MS: Negative for joint pain, low back pain.  Derm: Negative for rash or itching.  Neuro: Negative for weakness, abnormal sensation, seizure, frequent headaches, memory loss, confusion.  Psych: Negative for anxiety, depression, suicidal ideation, hallucinations.  Endo: Negative for unusual weight change.  Heme: Negative for bruising or bleeding. Allergy: Negative for rash or hives.    Physical Examination:  BP 111/68  Pulse 70  Temp(Src) 97.6 F (36.4 C) (Oral)  Ht 5\' 2"  (1.575 m)  Wt 153 lb 6.4 oz (69.582 kg)  BMI 28.05 kg/m2   General: Well-nourished, well-developed in no acute distress.  Head: Normocephalic, atraumatic.   Eyes: Conjunctiva pink, no icterus. Mouth: Oropharyngeal mucosa moist and pink , no lesions erythema or exudate. Neck: Supple without thyromegaly, masses, or lymphadenopathy.  Lungs: Clear to auscultation bilaterally.  Heart: Regular rate and rhythm, no murmurs rubs or gallops.  Abdomen: Bowel sounds are normal, nontender, nondistended, no hepatosplenomegaly or masses, no abdominal bruits or    hernia , no rebound or guarding.   Rectal: not performed Extremities: No lower extremity edema. No clubbing or deformities.  Neuro: Alert and oriented x 4 , grossly normal neurologically.  Skin: Warm and dry, no rash or jaundice.   Psych: Alert and cooperative, normal mood and affect.  Labs: Labs 09/19/2013 Total bilirubin 0.8, alkaline phosphatase 107, AST 36, ALT 100, albumin 3.8 Labs 08/15/2013 Total bilirubin 0.4, alkaline phosphatase 59, AST 21, ALT 16, albumin 4.1  TSH 0.717  Lab Results  Component Value Date   CREATININE 0.55 09/15/2013   BUN 13 09/15/2013   NA 137 09/15/2013   K 3.4* 09/15/2013   CL 99 09/15/2013   CO2 28 09/15/2013   Lab Results  Component Value Date   WBC 9.2 09/15/2013   HGB 13.8 09/15/2013   HCT 40.5 09/15/2013   MCV 95.5 09/15/2013   PLT 131* 09/15/2013   Lab Results  Component Value  Date   ALT 287* 09/15/2013   AST 210* 09/15/2013   ALKPHOS 115 09/15/2013   BILITOT 2.6* 09/15/2013   Lab Results  Component Value Date   LIPASE 25 09/15/2013     Imaging Studies: Ct Abdomen Pelvis Wo Contrast  09/16/2013   CLINICAL DATA:  Mid abdominal pain and vomiting beginning on Monday. Pain returned on Thursday and is worsening since then. Fatigue and weakness.  EXAM: CT ABDOMEN AND PELVIS WITHOUT CONTRAST  TECHNIQUE: Multidetector CT imaging of the abdomen and pelvis was performed following the standard protocol without IV contrast.  COMPARISON:  11/02/2011  FINDINGS: Atelectasis and honeycomb scarring in the lung bases. Mild cardiac enlargement. Coronary artery calcifications. Calcification of the  aorta. Small esophageal hiatal hernia. Contrast material in the lower esophagus suggest reflux or dysmotility. There is a small fat containing lesions in or adjacent to the lower esophageal wall likely representing a lipoma.  Surgical absence of the gallbladder. The unenhanced appearance of the liver, spleen, pancreas, adrenal glands, inferior vena cava, and retroperitoneal lymph nodes is unremarkable. Calcification throughout the abdominal aorta and branch vessels without aneurysm. Multiple calcifications within both kidneys could represent parenchymal stones or dystrophic parenchymal calcifications. Indeterminate 15 mm exophytic lesion off of the lower pole of the left kidney is unchanged since prior study. Stomach and small bowel are not abnormally distended and no wall thickening is appreciated. Contrast material flows through to the colon without evidence of obstruction. Small periumbilical hernias containing fat. No free air or free fluid in the abdomen.  Pelvis: Diverticulosis of sigmoid colon without evidence of diverticulitis. Appendix is not identified. No free or loculated pelvic fluid collections. Bladder is decompressed. No pelvic mass or lymphadenopathy. Uterus appears to be surgically absent.  Degenerative changes in the spine. No destructive bone lesions are appreciated.  IMPRESSION: No acute process demonstrated in the abdomen or pelvis. Again demonstrated are multiple intrarenal calcifications which could represent stones or dystrophic parenchymal calcification. No evidence of ureteral obstruction.   Electronically Signed   By: Lucienne Capers M.D.   On: 09/16/2013 00:07   Ct Head Wo Contrast  09/15/2013   CLINICAL DATA:  Slurred speech  EXAM: CT HEAD WITHOUT CONTRAST  TECHNIQUE: Contiguous axial images were obtained from the base of the skull through the vertex without intravenous contrast.  COMPARISON:  None.  FINDINGS: No evidence of parenchymal hemorrhage or extra-axial fluid collection. No mass lesion, mass effect, or midline shift.  No CT evidence of acute infarction.  Suspected chronic lacunar infarct in the right external capsule (series 2/image 16).  Subcortical white matter and periventricular small vessel ischemic changes.  Global cortical atrophy.  No ventriculomegaly.  Near complete opacification of the right maxillary sinus. Partial opacification of the bilateral ethmoid sinuses. Mastoid air cells are clear.  No evidence of calvarial fracture.  IMPRESSION: No evidence of acute intracranial abnormality.  Suspected old chronic lacunar infarct in the right external capsule.  Atrophy with small vessel ischemic changes.   Electronically Signed   By: Julian Hy M.D.   On: 09/15/2013 19:58   Mr Brain Wo Contrast  09/15/2013   CLINICAL DATA:  Slurred speech.  EXAM: MRI HEAD WITHOUT CONTRAST  TECHNIQUE: Multiplanar, multiecho pulse sequences of the brain and surrounding structures were obtained without intravenous contrast.  COMPARISON:  Head CT 09/15/2013  FINDINGS: Images are mildly to moderately degraded by motion artifact despite repeating some sequences. Patient is severely claustrophobic.  There is no evidence of acute infarct, intracranial hemorrhage, mass, midline shift, or  extra-axial fluid collection. Patchy T2 hyperintensities in the subcortical and deep cerebral white matter and pons are nonspecific but compatible with moderate chronic small vessel ischemic disease. There is mild-to-moderate generalized cerebral atrophy.  Prior bilateral cataract extraction is noted. There is partial opacification of right greater the left ethmoid air cells, and there is near complete opacification of the right maxillary sinus. Mastoid air cells are clear. Major intracranial vascular flow voids are grossly preserved.  IMPRESSION: 1. No acute intracranial abnormality. 2. Moderate chronic small vessel ischemic disease.   Electronically Signed   By: Logan Bores   On: 09/15/2013 21:40   Dg Chest Port 1 View  09/15/2013   CLINICAL DATA:  Chest  pain.  Abdominal pain.  EXAM: PORTABLE CHEST - 1 VIEW  COMPARISON:  11/04/2011  FINDINGS: Cardiomegaly stable. Pulmonary hyperinflation again demonstrated. Diffuse interstitial prominence shows no significant change, consistent with chronic interstitial lung disease. No evidence of pulmonary consolidation or pleural effusion. Enlarged pulmonary arteries again demonstrated, suspicious for pulmonary arterial hypertension.  IMPRESSION: No acute findings.  Stable pulmonary hyperinflation, and chronic interstitial lung disease.  Stable cardiomegaly and enlarged pulmonary arteries suspicious for pulmonary arterial hypertension.   Electronically Signed   By: Earle Gell M.D.   On: 09/15/2013 20:00

## 2013-10-01 ENCOUNTER — Encounter: Payer: Self-pay | Admitting: Gastroenterology

## 2013-10-01 NOTE — Assessment & Plan Note (Signed)
Recent acute onset RUQ pain associated with elevation of LFTs. CT Abd without contrast unremarkable. DDX: papillary stenosis, choledocholithiasis, SOD, malignancy, less likely Lipitor. Symptoms have now resolved. Patients LFTs improved. She is not interested in any further evaluation, in part due to "being tired" of tests and also due to financial concerns. I discussed case with Dr. Luan Pulling. He is aware of patient declining work-up, specifically MRCP. He advised patient to go back on Zocor, due to her concerns of being off of cholesterol meds. She will call and schedule follow up appointment with him.   I will have office provide patient with information regarding [place] assistance to see if she qualifies for discount.  Patient reports she will likely pursue work-up if she has further symptoms. Recommended she have her LFTs rechecked with Dr. Luan Pulling with next labs.

## 2013-10-03 ENCOUNTER — Telehealth: Payer: Self-pay

## 2013-10-03 NOTE — Telephone Encounter (Signed)
Message copied by Marlou Porch on Tue Oct 03, 2013 11:19 AM ------      Message from: Zeb Comfort D      Created: Tue Oct 03, 2013  9:06 AM       Patient is insured by [employer]. CM said patients that are insured do not qualify for [facility]. Unless, she has dropped her insurance for some reason.      ----- Message -----         From: Marlou Porch, CMA         Sent: 10/03/2013   8:21 AM           To: Theadora Rama            Can you please mail her some [employer] assistance information.I will call her.                  Thanks      Ginger      ----- Message -----         From: Mahala Menghini, PA-C         Sent: 10/01/2013   9:42 PM           To: Mahala Menghini, PA-C, Marlou Porch, CMA            Can you contact patient with information on all to request cone assistance or discount for her hospital medical bills.              ------

## 2013-10-03 NOTE — Progress Notes (Signed)
cc'ed to pcp °

## 2013-10-03 NOTE — Telephone Encounter (Signed)
Pt is aware that she if she has insurance she can not apply for [employer] assistance

## 2013-10-10 ENCOUNTER — Ambulatory Visit: Payer: Medicare Other | Admitting: Cardiology

## 2013-10-23 ENCOUNTER — Ambulatory Visit: Payer: Medicare Other | Admitting: Gastroenterology

## 2014-01-23 DIAGNOSIS — M25512 Pain in left shoulder: Secondary | ICD-10-CM | POA: Diagnosis not present

## 2014-01-23 DIAGNOSIS — J449 Chronic obstructive pulmonary disease, unspecified: Secondary | ICD-10-CM | POA: Diagnosis not present

## 2014-02-08 DIAGNOSIS — J449 Chronic obstructive pulmonary disease, unspecified: Secondary | ICD-10-CM | POA: Diagnosis not present

## 2014-02-15 DIAGNOSIS — R197 Diarrhea, unspecified: Secondary | ICD-10-CM | POA: Diagnosis not present

## 2014-02-15 DIAGNOSIS — J449 Chronic obstructive pulmonary disease, unspecified: Secondary | ICD-10-CM | POA: Diagnosis not present

## 2014-02-15 DIAGNOSIS — M25512 Pain in left shoulder: Secondary | ICD-10-CM | POA: Diagnosis not present

## 2014-02-15 DIAGNOSIS — I2510 Atherosclerotic heart disease of native coronary artery without angina pectoris: Secondary | ICD-10-CM | POA: Diagnosis not present

## 2014-02-20 ENCOUNTER — Encounter: Payer: Self-pay | Admitting: Gastroenterology

## 2014-03-06 ENCOUNTER — Encounter: Payer: Self-pay | Admitting: Gastroenterology

## 2014-03-11 DIAGNOSIS — J449 Chronic obstructive pulmonary disease, unspecified: Secondary | ICD-10-CM | POA: Diagnosis not present

## 2014-03-13 ENCOUNTER — Ambulatory Visit: Payer: Self-pay | Admitting: Gastroenterology

## 2014-04-09 DIAGNOSIS — J449 Chronic obstructive pulmonary disease, unspecified: Secondary | ICD-10-CM | POA: Diagnosis not present

## 2014-05-10 DIAGNOSIS — J449 Chronic obstructive pulmonary disease, unspecified: Secondary | ICD-10-CM | POA: Diagnosis not present

## 2014-05-16 DIAGNOSIS — E785 Hyperlipidemia, unspecified: Secondary | ICD-10-CM | POA: Diagnosis not present

## 2014-05-16 DIAGNOSIS — I10 Essential (primary) hypertension: Secondary | ICD-10-CM | POA: Diagnosis not present

## 2014-05-16 DIAGNOSIS — I2510 Atherosclerotic heart disease of native coronary artery without angina pectoris: Secondary | ICD-10-CM | POA: Diagnosis not present

## 2014-05-16 DIAGNOSIS — J449 Chronic obstructive pulmonary disease, unspecified: Secondary | ICD-10-CM | POA: Diagnosis not present

## 2014-05-28 ENCOUNTER — Encounter (HOSPITAL_COMMUNITY): Payer: Self-pay | Admitting: Emergency Medicine

## 2014-05-28 ENCOUNTER — Emergency Department (HOSPITAL_COMMUNITY)
Admission: EM | Admit: 2014-05-28 | Discharge: 2014-05-28 | Discharge status: Against Medical Advice | Payer: Medicare Other | Attending: Emergency Medicine | Admitting: Emergency Medicine

## 2014-05-28 DIAGNOSIS — Z720 Tobacco use: Secondary | ICD-10-CM | POA: Diagnosis not present

## 2014-05-28 DIAGNOSIS — I10 Essential (primary) hypertension: Secondary | ICD-10-CM | POA: Diagnosis not present

## 2014-05-28 DIAGNOSIS — K5900 Constipation, unspecified: Secondary | ICD-10-CM | POA: Diagnosis not present

## 2014-05-28 DIAGNOSIS — J449 Chronic obstructive pulmonary disease, unspecified: Secondary | ICD-10-CM | POA: Diagnosis not present

## 2014-05-28 DIAGNOSIS — R109 Unspecified abdominal pain: Secondary | ICD-10-CM | POA: Diagnosis not present

## 2014-05-28 DIAGNOSIS — I2510 Atherosclerotic heart disease of native coronary artery without angina pectoris: Secondary | ICD-10-CM | POA: Insufficient documentation

## 2014-05-28 NOTE — ED Notes (Signed)
Pt states that she has been very constipated for the past 3 days.  Has been taking laxatives with no relief.

## 2014-06-09 DIAGNOSIS — J449 Chronic obstructive pulmonary disease, unspecified: Secondary | ICD-10-CM | POA: Diagnosis not present

## 2014-07-10 DIAGNOSIS — J449 Chronic obstructive pulmonary disease, unspecified: Secondary | ICD-10-CM | POA: Diagnosis not present

## 2014-08-09 DIAGNOSIS — J449 Chronic obstructive pulmonary disease, unspecified: Secondary | ICD-10-CM | POA: Diagnosis not present

## 2014-09-09 DIAGNOSIS — J449 Chronic obstructive pulmonary disease, unspecified: Secondary | ICD-10-CM | POA: Diagnosis not present

## 2014-09-27 DIAGNOSIS — J441 Chronic obstructive pulmonary disease with (acute) exacerbation: Secondary | ICD-10-CM | POA: Diagnosis not present

## 2014-09-27 DIAGNOSIS — I10 Essential (primary) hypertension: Secondary | ICD-10-CM | POA: Diagnosis not present

## 2014-09-27 DIAGNOSIS — I2510 Atherosclerotic heart disease of native coronary artery without angina pectoris: Secondary | ICD-10-CM | POA: Diagnosis not present

## 2014-09-28 ENCOUNTER — Ambulatory Visit (HOSPITAL_COMMUNITY)
Admission: RE | Admit: 2014-09-28 | Discharge: 2014-09-28 | Disposition: A | Discharge status: Home / Self Care | Payer: Medicare Other | Source: Ambulatory Visit | Attending: Pulmonary Disease | Admitting: Pulmonary Disease

## 2014-09-28 ENCOUNTER — Other Ambulatory Visit (HOSPITAL_COMMUNITY): Payer: Self-pay | Admitting: Pulmonary Disease

## 2014-09-28 DIAGNOSIS — I517 Cardiomegaly: Secondary | ICD-10-CM | POA: Diagnosis not present

## 2014-09-28 DIAGNOSIS — R05 Cough: Secondary | ICD-10-CM | POA: Insufficient documentation

## 2014-09-28 DIAGNOSIS — J441 Chronic obstructive pulmonary disease with (acute) exacerbation: Secondary | ICD-10-CM

## 2014-09-28 DIAGNOSIS — J849 Interstitial pulmonary disease, unspecified: Secondary | ICD-10-CM | POA: Diagnosis not present

## 2014-10-01 DIAGNOSIS — J441 Chronic obstructive pulmonary disease with (acute) exacerbation: Secondary | ICD-10-CM | POA: Diagnosis not present

## 2014-10-01 DIAGNOSIS — I10 Essential (primary) hypertension: Secondary | ICD-10-CM | POA: Diagnosis not present

## 2014-10-08 DIAGNOSIS — I2510 Atherosclerotic heart disease of native coronary artery without angina pectoris: Secondary | ICD-10-CM | POA: Diagnosis not present

## 2014-10-08 DIAGNOSIS — I10 Essential (primary) hypertension: Secondary | ICD-10-CM | POA: Diagnosis not present

## 2014-10-08 DIAGNOSIS — E785 Hyperlipidemia, unspecified: Secondary | ICD-10-CM | POA: Diagnosis not present

## 2014-10-08 DIAGNOSIS — J441 Chronic obstructive pulmonary disease with (acute) exacerbation: Secondary | ICD-10-CM | POA: Diagnosis not present

## 2014-10-10 DIAGNOSIS — J449 Chronic obstructive pulmonary disease, unspecified: Secondary | ICD-10-CM | POA: Diagnosis not present

## 2014-10-31 DIAGNOSIS — J449 Chronic obstructive pulmonary disease, unspecified: Secondary | ICD-10-CM | POA: Diagnosis not present

## 2014-10-31 DIAGNOSIS — J9611 Chronic respiratory failure with hypoxia: Secondary | ICD-10-CM | POA: Diagnosis not present

## 2014-10-31 DIAGNOSIS — I25119 Atherosclerotic heart disease of native coronary artery with unspecified angina pectoris: Secondary | ICD-10-CM | POA: Diagnosis not present

## 2014-10-31 DIAGNOSIS — I10 Essential (primary) hypertension: Secondary | ICD-10-CM | POA: Diagnosis not present

## 2014-11-09 DIAGNOSIS — J449 Chronic obstructive pulmonary disease, unspecified: Secondary | ICD-10-CM | POA: Diagnosis not present

## 2014-12-10 DIAGNOSIS — J449 Chronic obstructive pulmonary disease, unspecified: Secondary | ICD-10-CM | POA: Diagnosis not present

## 2015-01-09 ENCOUNTER — Encounter (HOSPITAL_COMMUNITY): Payer: Self-pay | Admitting: Emergency Medicine

## 2015-01-09 ENCOUNTER — Emergency Department (HOSPITAL_COMMUNITY): Payer: Medicare Other

## 2015-01-09 ENCOUNTER — Emergency Department (HOSPITAL_COMMUNITY)
Admission: EM | Admit: 2015-01-09 | Discharge: 2015-01-09 | Disposition: A | Discharge status: Home / Self Care | Payer: Medicare Other | Attending: Emergency Medicine | Admitting: Emergency Medicine

## 2015-01-09 DIAGNOSIS — Z87891 Personal history of nicotine dependence: Secondary | ICD-10-CM | POA: Insufficient documentation

## 2015-01-09 DIAGNOSIS — E7800 Pure hypercholesterolemia, unspecified: Secondary | ICD-10-CM | POA: Diagnosis not present

## 2015-01-09 DIAGNOSIS — I2510 Atherosclerotic heart disease of native coronary artery without angina pectoris: Secondary | ICD-10-CM | POA: Diagnosis not present

## 2015-01-09 DIAGNOSIS — Z853 Personal history of malignant neoplasm of breast: Secondary | ICD-10-CM | POA: Diagnosis not present

## 2015-01-09 DIAGNOSIS — R05 Cough: Secondary | ICD-10-CM | POA: Diagnosis not present

## 2015-01-09 DIAGNOSIS — J441 Chronic obstructive pulmonary disease with (acute) exacerbation: Secondary | ICD-10-CM | POA: Diagnosis not present

## 2015-01-09 DIAGNOSIS — F419 Anxiety disorder, unspecified: Secondary | ICD-10-CM | POA: Insufficient documentation

## 2015-01-09 DIAGNOSIS — F329 Major depressive disorder, single episode, unspecified: Secondary | ICD-10-CM | POA: Insufficient documentation

## 2015-01-09 DIAGNOSIS — Z8719 Personal history of other diseases of the digestive system: Secondary | ICD-10-CM | POA: Diagnosis not present

## 2015-01-09 DIAGNOSIS — J449 Chronic obstructive pulmonary disease, unspecified: Secondary | ICD-10-CM | POA: Diagnosis not present

## 2015-01-09 DIAGNOSIS — Z792 Long term (current) use of antibiotics: Secondary | ICD-10-CM | POA: Insufficient documentation

## 2015-01-09 DIAGNOSIS — R0602 Shortness of breath: Secondary | ICD-10-CM | POA: Diagnosis not present

## 2015-01-09 DIAGNOSIS — Z79899 Other long term (current) drug therapy: Secondary | ICD-10-CM | POA: Insufficient documentation

## 2015-01-09 DIAGNOSIS — I10 Essential (primary) hypertension: Secondary | ICD-10-CM | POA: Insufficient documentation

## 2015-01-09 LAB — BRAIN NATRIURETIC PEPTIDE: B Natriuretic Peptide: 130 pg/mL — ABNORMAL HIGH (ref 0.0–100.0)

## 2015-01-09 LAB — CBC
HEMATOCRIT: 43.9 % (ref 36.0–46.0)
HEMOGLOBIN: 14.1 g/dL (ref 12.0–15.0)
MCH: 31.8 pg (ref 26.0–34.0)
MCHC: 32.1 g/dL (ref 30.0–36.0)
MCV: 99.1 fL (ref 78.0–100.0)
Platelets: 138 10*3/uL — ABNORMAL LOW (ref 150–400)
RBC: 4.43 MIL/uL (ref 3.87–5.11)
RDW: 13.2 % (ref 11.5–15.5)
WBC: 6.3 10*3/uL (ref 4.0–10.5)

## 2015-01-09 LAB — BASIC METABOLIC PANEL
ANION GAP: 3 — AB (ref 5–15)
BUN: 9 mg/dL (ref 6–20)
CO2: 33 mmol/L — AB (ref 22–32)
Calcium: 9 mg/dL (ref 8.9–10.3)
Chloride: 103 mmol/L (ref 101–111)
Creatinine, Ser: 0.66 mg/dL (ref 0.44–1.00)
GFR calc Af Amer: >60 mL/min (ref >60)
GFR calc non Af Amer: >60 mL/min (ref >60)
GLUCOSE: 105 mg/dL — AB (ref 65–99)
POTASSIUM: 3.7 mmol/L (ref 3.5–5.1)
Sodium: 139 mmol/L (ref 135–145)

## 2015-01-09 LAB — TROPONIN I: Troponin I: <0.03 ng/mL (ref <0.031)

## 2015-01-09 MED ORDER — METHYLPREDNISOLONE SODIUM SUCC 125 MG IJ SOLR
125.0000 mg | Freq: Once | INTRAMUSCULAR | Status: AC
  Administered 2015-01-09: 125 mg via INTRAVENOUS
  Filled 2015-01-09: qty 2

## 2015-01-09 MED ORDER — ALBUTEROL SULFATE (2.5 MG/3ML) 0.083% IN NEBU
2.5000 mg | INHALATION_SOLUTION | Freq: Once | RESPIRATORY_TRACT | Status: AC
  Administered 2015-01-09: 2.5 mg via RESPIRATORY_TRACT

## 2015-01-09 MED ORDER — ALBUTEROL SULFATE (2.5 MG/3ML) 0.083% IN NEBU
INHALATION_SOLUTION | RESPIRATORY_TRACT | Status: AC
  Filled 2015-01-09: qty 3

## 2015-01-09 MED ORDER — IPRATROPIUM-ALBUTEROL 0.5-2.5 (3) MG/3ML IN SOLN
3.0000 mL | Freq: Once | RESPIRATORY_TRACT | Status: AC
  Administered 2015-01-09: 3 mL via RESPIRATORY_TRACT
  Filled 2015-01-09: qty 3

## 2015-01-09 MED ORDER — PREDNISONE 10 MG (21) PO TBPK: ORAL_TABLET | ORAL | Status: DC

## 2015-01-09 MED ORDER — ALBUTEROL SULFATE (2.5 MG/3ML) 0.083% IN NEBU: 5.0000 mg | INHALATION_SOLUTION | Freq: Once | RESPIRATORY_TRACT | Status: DC

## 2015-01-09 MED ORDER — IPRATROPIUM BROMIDE 0.02 % IN SOLN: 0.5000 mg | Freq: Once | RESPIRATORY_TRACT | Status: DC

## 2015-01-09 NOTE — ED Notes (Signed)
Pt has hx of COPd and on Saturday felt like she was coming down with a cold . -- sob and cough has been getting worst , Took some left over Bactim yesterday and today - but SOb contiues to get worst. Pt also noted som e edema bilateral lower extremeties, wheezing in triage

## 2015-01-09 NOTE — Discharge Instructions (Signed)
Chronic Obstructive Pulmonary Disease Chronic obstructive pulmonary disease (COPD) is a common lung condition in which airflow from the lungs is limited. COPD is a general term that can be used to describe many different lung problems that limit airflow, including both chronic bronchitis and emphysema. If you have COPD, your lung function will probably never return to normal, but there are measures you can take to improve lung function and make yourself feel better. CAUSES   Smoking (common).  Exposure to secondhand smoke.  Genetic problems.  Chronic inflammatory lung diseases or recurrent infections. SYMPTOMS  Shortness of breath, especially with physical activity.  Deep, persistent (chronic) cough with a large amount of thick mucus.  Wheezing.  Rapid breaths (tachypnea).  Gray or bluish discoloration (cyanosis) of the skin, especially in your fingers, toes, or lips.  Fatigue.  Weight loss.  Frequent infections or episodes when breathing symptoms become much worse (exacerbations).  Chest tightness. DIAGNOSIS Your health care provider will take a medical history and perform a physical examination to diagnose COPD. Additional tests for COPD may include:  Lung (pulmonary) function tests.  Chest X-ray.  CT scan.  Blood tests. TREATMENT  Treatment for COPD may include:  Inhaler and nebulizer medicines. These help manage the symptoms of COPD and make your breathing more comfortable.  Supplemental oxygen. Supplemental oxygen is only helpful if you have a low oxygen level in your blood.  Exercise and physical activity. These are beneficial for nearly all people with COPD.  Lung surgery or transplant.  Nutrition therapy to gain weight, if you are underweight.  Pulmonary rehabilitation. This may involve working with a team of health care providers and specialists, such as respiratory, occupational, and physical therapists. HOME CARE INSTRUCTIONS  Take all medicines  (inhaled or pills) as directed by your health care provider.  Avoid over-the-counter medicines or cough syrups that dry up your airway (such as antihistamines) and slow down the elimination of secretions unless instructed otherwise by your health care provider.  If you are a smoker, the most important thing that you can do is stop smoking. Continuing to smoke will cause further lung damage and breathing trouble. Ask your health care provider for help with quitting smoking. He or she can direct you to community resources or hospitals that provide support.  Avoid exposure to irritants such as smoke, chemicals, and fumes that aggravate your breathing.  Use oxygen therapy and pulmonary rehabilitation if directed by your health care provider. If you require home oxygen therapy, ask your health care provider whether you should purchase a pulse oximeter to measure your oxygen level at home.  Avoid contact with individuals who have a contagious illness.  Avoid extreme temperature and humidity changes.  Eat healthy foods. Eating smaller, more frequent meals and resting before meals may help you maintain your strength.  Stay active, but balance activity with periods of rest. Exercise and physical activity will help you maintain your ability to do things you want to do.  Preventing infection and hospitalization is very important when you have COPD. Make sure to receive all the vaccines your health care provider recommends, especially the pneumococcal and influenza vaccines. Ask your health care provider whether you need a pneumonia vaccine.  Learn and use relaxation techniques to manage stress.  Learn and use controlled breathing techniques as directed by your health care provider. Controlled breathing techniques include:  Pursed lip breathing. Start by breathing in (inhaling) through your nose for 1 second. Then, purse your lips as if you were   going to whistle and breathe out (exhale) through the  pursed lips for 2 seconds.  Diaphragmatic breathing. Start by putting one hand on your abdomen just above your waist. Inhale slowly through your nose. The hand on your abdomen should move out. Then purse your lips and exhale slowly. You should be able to feel the hand on your abdomen moving in as you exhale.  Learn and use controlled coughing to clear mucus from your lungs. Controlled coughing is a series of short, progressive coughs. The steps of controlled coughing are: 1. Lean your head slightly forward. 2. Breathe in deeply using diaphragmatic breathing. 3. Try to hold your breath for 3 seconds. 4. Keep your mouth slightly open while coughing twice. 5. Spit any mucus out into a tissue. 6. Rest and repeat the steps once or twice as needed. SEEK MEDICAL CARE IF:  You are coughing up more mucus than usual.  There is a change in the color or thickness of your mucus.  Your breathing is more labored than usual.  Your breathing is faster than usual. SEEK IMMEDIATE MEDICAL CARE IF:  You have shortness of breath while you are resting.  You have shortness of breath that prevents you from:  Being able to talk.  Performing your usual physical activities.  You have chest pain lasting longer than 5 minutes.  Your skin color is more cyanotic than usual.  You measure low oxygen saturations for longer than 5 minutes with a pulse oximeter. MAKE SURE YOU:  Understand these instructions.  Will watch your condition.  Will get help right away if you are not doing well or get worse.   This information is not intended to replace advice given to you by your health care provider. Make sure you discuss any questions you have with your health care provider.   Document Released: 10/08/2004 Document Revised: 01/19/2014 Document Reviewed: 08/25/2012 Elsevier Interactive Patient Education 2016 [employer]  

## 2015-01-09 NOTE — ED Provider Notes (Signed)
CSN: [id number]     Arrival date & time 01/09/15  1256 History   First MD Initiated Contact with Patient 01/09/15 1320     Chief Complaint  Patient presents with  . Shortness of Breath    Patient is a 81 y.o. female presenting with shortness of breath. The history is provided by the patient.  Shortness of Breath Severity:  Moderate Onset quality:  Gradual Duration:  5 days Timing:  Constant Progression:  Worsening Chronicity:  New Context: URI   Relieved by:  Nothing Worsened by:  Activity Ineffective treatments: Pt had a lung infection in the past and was prescribed bactrim.  She didnt finish all of it at that time and had 5 days worth left.  She started them when she began getting sick. Associated symptoms: cough and sputum production   Associated symptoms: no abdominal pain and no fever   Risk factors: no family hx of DVT and no hx of PE/DVT   Pt is a smoker but she stopped when she got this illness.  She felt like her shoes were tight.  Past Medical History  Diagnosis Date  . Hypertension   . Hypercholesteremia   . Coronary artery disease     2 stents per patient  . COPD (chronic obstructive pulmonary disease) ([place])   . Anxiety   . Depression   . Breast cancer ([place]) 2003    treated with surgery and Arimadex  . Ischemic colitis ([place]) 2009  . Dilated bile duct     noted on CT 2009, ampulla looked normal on EGD  . Aneurysm [facility])     thoracic aorta   Past Surgical History  Procedure Laterality Date  . Abdominal hysterectomy    . Hernia repair    . Cholecystectomy    . Breast surgery      left partial mastectomy 04/15/11, wide excision left breast  04/21/11, left partial mastectomy 12/19/02  . Appendectomy    . Colonoscopy  05/2007    Dr. Perfecto Kingdom, ulcerated, edematous area at 35-40cm from anal verge bx c/w ischemic colitis, frequent sigm tics, small internal hemrrhoids. SMA/IMA wide open on CT at that time.   . Esophagogastroduodenoscopy  05/2007    Dr.  Malon Kindle ring, 2-3 cm HH, streaky erythema antrum with superficial erosions, bx gastritis without H.Pylori   Family History  Problem Relation Age of Onset  . Breast cancer Daughter   . Breast cancer Mother   . Breast cancer Sister   . Colon cancer Neg Hx    Social History  Substance Use Topics  . Smoking status: Former Smoker    Quit date: 01/05/2015  . Smokeless tobacco: None  . Alcohol Use: No   OB History    No data available     Review of Systems  Constitutional: Negative for fever.  Respiratory: Positive for cough, sputum production and shortness of breath.   Gastrointestinal: Negative for abdominal pain.  All other systems reviewed and are negative.     Allergies  Ceftin and Iohexol  Home Medications   Prior to Admission medications   Medication Sig Start Date End Date Taking? Authorizing Provider  acetaminophen (TYLENOL) 500 MG tablet Take 500 mg by mouth every 6 (six) hours as needed.   Yes Historical Provider, MD  albuterol (PROVENTIL HFA;VENTOLIN HFA) 108 (90 BASE) MCG/ACT inhaler Inhale 2 puffs into the lungs 2 (two) times daily as needed. Shortness of Breath   Yes Historical Provider, MD  atenolol (TENORMIN) 25 MG tablet Take  12.5 mg by mouth every morning.    Yes Historical Provider, MD  escitalopram (LEXAPRO) 5 MG tablet Take 5 mg by mouth daily.     Yes Historical Provider, MD  LORazepam (ATIVAN) 0.5 MG tablet Take 0.5 mg by mouth at bedtime.    Yes Historical Provider, MD  nitroGLYCERIN (NITROSTAT) 0.4 MG SL tablet Place 0.4 mg under the tongue every 5 (five) minutes as needed for chest pain.   Yes Historical Provider, MD  pantoprazole (PROTONIX) 40 MG tablet Take 40 mg by mouth at bedtime.    Yes Historical Provider, MD  Phenylephrine-DM-GG-APAP (MUCINEX FAST-MAX COLD FLU) 5-10-200-325 MG/10ML LIQD Take 30 mLs by mouth 2 (two) times daily as needed (cold).   Yes Historical Provider, MD  simvastatin (ZOCOR) 80 MG tablet Take 1 tablet by  mouth daily. 12/14/14  Yes Historical Provider, MD  sulfamethoxazole-trimethoprim (BACTRIM DS,SEPTRA DS) 800-160 MG tablet Take 1 tablet by mouth 2 (two) times daily. 12/03/14  Yes Historical Provider, MD  traMADol (ULTRAM) 50 MG tablet Take 1 tablet by mouth daily as needed for moderate pain.  10/31/14  Yes Historical Provider, MD  predniSONE (STERAPRED UNI-PAK 21 TAB) 10 MG (21) TBPK tablet Take 6 tabs by mouth daily  for 2 days, then 5 tabs for 2 days, then 4 tabs for 2 days, then 3 tabs for 2 days, 2 tabs for 2 days, then 1 tab by mouth daily for 2 days 01/09/15   Dorie Rank, MD   BP 123/63 mmHg  Pulse 85  Temp(Src) 98.2 F (36.8 C) (Oral)  Resp 21  Ht '5\' 5"'$  (1.651 m)  Wt 66.225 kg  BMI 24.30 kg/m2  SpO2 94% Physical Exam  Constitutional: She appears well-developed and well-nourished. No distress.  HENT:  Head: Normocephalic and atraumatic.  Right Ear: External ear normal.  Left Ear: External ear normal.  Eyes: Conjunctivae are normal. Right eye exhibits no discharge. Left eye exhibits no discharge. No scleral icterus.  Neck: Neck supple. No tracheal deviation present.  Cardiovascular: Normal rate, regular rhythm and intact distal pulses.   Pulmonary/Chest: Effort normal. No accessory muscle usage or stridor. No tachypnea. No respiratory distress. She has wheezes. She has no rales.  Abdominal: Soft. Bowel sounds are normal. She exhibits no distension. There is no tenderness. There is no rebound and no guarding.  Musculoskeletal: She exhibits no edema or tenderness.  Neurological: She is alert. She has normal strength. No cranial nerve deficit (no facial droop, extraocular movements intact, no slurred speech) or sensory deficit. She exhibits normal muscle tone. She displays no seizure activity. Coordination normal.  Skin: Skin is warm and dry. No rash noted.  Psychiatric: She has a normal mood and affect.  Nursing note and vitals reviewed.   ED Course  Procedures (including critical  care time) Labs Review Labs Reviewed  BASIC METABOLIC PANEL - Abnormal; Notable for the following:    CO2 33 (*)    Glucose, Bld 105 (*)    Anion gap 3 (*)    All other components within normal limits  CBC - Abnormal; Notable for the following:    Platelets 138 (*)    All other components within normal limits  BRAIN NATRIURETIC PEPTIDE - Abnormal; Notable for the following:    B Natriuretic Peptide 130.0 (*)    All other components within normal limits  TROPONIN I    Imaging Review Dg Chest 2 View  01/09/2015  CLINICAL DATA:  Cough and congestion since Saturday, coughing up stuff this  morning, shortness of breath, history hypertension, coronary artery disease, COPD, breast cancer, former smoker EXAM: CHEST  2 VIEW COMPARISON:  09/28/2014 FINDINGS: Enlargement of cardiac silhouette. Calcified tortuous aorta. Mediastinal contours and pulmonary vascularity normal. Emphysematous changes consistent with COPD. Chronic interstitial prominence and biapical scarring stable. No definite acute infiltrate, pleural effusion, or pneumothorax. Bones demineralized. IMPRESSION: Enlargement of cardiac silhouette. Changes of COPD and chronic interstitial disease without definite acute infiltrate. Electronically Signed   By: Lavonia Dana M.D.   On: 01/09/2015 13:53   I have personally reviewed and evaluated these images and lab results as part of my medical decision-making.   EKG Interpretation   Date/Time:  Wednesday January 09 2015 13:14:16 EST Ventricular Rate:  80 PR Interval:  207 QRS Duration: 76 QT Interval:  363 QTC Calculation: 419 R Axis:   79 Text Interpretation:  Sinus rhythm Abnormal R-wave progression, early  transition No significant change was found Confirmed by Wyvonnia Dusky  MD,  [place] [phone]) on 01/09/2015 1:20:21 PM      MDM   Final diagnoses:  COPD exacerbation ([place])    Patient's x-ray does not show any evidence of pneumonia. Laboratory tests are reassuring  suspect her  symptoms are related to a COPD exacerbation. Patient did note some improvement in the emergency room after getting a breathing treatment and steroids. I will discharge her home on a steroid taper and have her follow-up with her primary care doctor to be rechecked.   Dorie Rank, MD 01/09/15 1600

## 2015-01-13 ENCOUNTER — Emergency Department (HOSPITAL_COMMUNITY): Payer: Medicare Other

## 2015-01-13 ENCOUNTER — Inpatient Hospital Stay (HOSPITAL_COMMUNITY)
Admission: EM | Admit: 2015-01-13 | Discharge: 2015-01-16 | DRG: 190 | Disposition: A | Discharge status: Home / Self Care | Payer: Medicare Other | Attending: Pulmonary Disease | Admitting: Pulmonary Disease

## 2015-01-13 ENCOUNTER — Encounter (HOSPITAL_COMMUNITY): Payer: Self-pay | Admitting: *Deleted

## 2015-01-13 DIAGNOSIS — F419 Anxiety disorder, unspecified: Secondary | ICD-10-CM | POA: Diagnosis not present

## 2015-01-13 DIAGNOSIS — R0682 Tachypnea, not elsewhere classified: Secondary | ICD-10-CM | POA: Diagnosis not present

## 2015-01-13 DIAGNOSIS — Z87891 Personal history of nicotine dependence: Secondary | ICD-10-CM | POA: Diagnosis not present

## 2015-01-13 DIAGNOSIS — R531 Weakness: Secondary | ICD-10-CM

## 2015-01-13 DIAGNOSIS — J441 Chronic obstructive pulmonary disease with (acute) exacerbation: Principal | ICD-10-CM | POA: Diagnosis present

## 2015-01-13 DIAGNOSIS — Z803 Family history of malignant neoplasm of breast: Secondary | ICD-10-CM | POA: Diagnosis not present

## 2015-01-13 DIAGNOSIS — J9611 Chronic respiratory failure with hypoxia: Secondary | ICD-10-CM | POA: Diagnosis not present

## 2015-01-13 DIAGNOSIS — Z91041 Radiographic dye allergy status: Secondary | ICD-10-CM | POA: Diagnosis not present

## 2015-01-13 DIAGNOSIS — I10 Essential (primary) hypertension: Secondary | ICD-10-CM | POA: Diagnosis not present

## 2015-01-13 DIAGNOSIS — I2510 Atherosclerotic heart disease of native coronary artery without angina pectoris: Secondary | ICD-10-CM | POA: Diagnosis present

## 2015-01-13 DIAGNOSIS — J9621 Acute and chronic respiratory failure with hypoxia: Secondary | ICD-10-CM | POA: Diagnosis present

## 2015-01-13 DIAGNOSIS — E7800 Pure hypercholesterolemia, unspecified: Secondary | ICD-10-CM | POA: Diagnosis present

## 2015-01-13 DIAGNOSIS — Z853 Personal history of malignant neoplasm of breast: Secondary | ICD-10-CM

## 2015-01-13 DIAGNOSIS — R7989 Other specified abnormal findings of blood chemistry: Secondary | ICD-10-CM | POA: Diagnosis present

## 2015-01-13 DIAGNOSIS — R0602 Shortness of breath: Secondary | ICD-10-CM

## 2015-01-13 DIAGNOSIS — R05 Cough: Secondary | ICD-10-CM | POA: Diagnosis not present

## 2015-01-13 LAB — URINALYSIS, ROUTINE W REFLEX MICROSCOPIC
Bilirubin Urine: NEGATIVE
GLUCOSE, UA: NEGATIVE mg/dL
Hgb urine dipstick: NEGATIVE
KETONES UR: NEGATIVE mg/dL
LEUKOCYTES UA: NEGATIVE
Nitrite: NEGATIVE
PH: 7.5 (ref 5.0–8.0)
PROTEIN: NEGATIVE mg/dL
Specific Gravity, Urine: 1.015 (ref 1.005–1.030)

## 2015-01-13 LAB — CBC WITH DIFFERENTIAL/PLATELET
Basophils Absolute: 0 10*3/uL (ref 0.0–0.1)
Basophils Relative: 0 %
Eosinophils Absolute: 0 10*3/uL (ref 0.0–0.7)
Eosinophils Relative: 0 %
HEMATOCRIT: 43.9 % (ref 36.0–46.0)
HEMOGLOBIN: 14.2 g/dL (ref 12.0–15.0)
LYMPHS ABS: 1 10*3/uL (ref 0.7–4.0)
Lymphocytes Relative: 13 %
MCH: 32.1 pg (ref 26.0–34.0)
MCHC: 32.3 g/dL (ref 30.0–36.0)
MCV: 99.1 fL (ref 78.0–100.0)
MONOS PCT: 4 %
Monocytes Absolute: 0.3 10*3/uL (ref 0.1–1.0)
NEUTROS ABS: 6.2 10*3/uL (ref 1.7–7.7)
NEUTROS PCT: 83 %
Platelets: 142 10*3/uL — ABNORMAL LOW (ref 150–400)
RBC: 4.43 MIL/uL (ref 3.87–5.11)
RDW: 13.1 % (ref 11.5–15.5)
WBC: 7.4 10*3/uL (ref 4.0–10.5)

## 2015-01-13 LAB — COMPREHENSIVE METABOLIC PANEL
ALT: 41 U/L (ref 14–54)
ANION GAP: 6 (ref 5–15)
AST: 27 U/L (ref 15–41)
Albumin: 3.6 g/dL (ref 3.5–5.0)
Alkaline Phosphatase: 55 U/L (ref 38–126)
BUN: 23 mg/dL — ABNORMAL HIGH (ref 6–20)
CHLORIDE: 103 mmol/L (ref 101–111)
CO2: 33 mmol/L — AB (ref 22–32)
Calcium: 9.7 mg/dL (ref 8.9–10.3)
Creatinine, Ser: 0.61 mg/dL (ref 0.44–1.00)
GFR calc non Af Amer: >60 mL/min (ref >60)
Glucose, Bld: 134 mg/dL — ABNORMAL HIGH (ref 65–99)
Potassium: 4.3 mmol/L (ref 3.5–5.1)
SODIUM: 142 mmol/L (ref 135–145)
Total Bilirubin: 0.6 mg/dL (ref 0.3–1.2)
Total Protein: 6.5 g/dL (ref 6.5–8.1)

## 2015-01-13 LAB — TROPONIN I

## 2015-01-13 LAB — D-DIMER, QUANTITATIVE: D-Dimer, Quant: 1.66 ug/mL-FEU — ABNORMAL HIGH (ref 0.00–0.50)

## 2015-01-13 LAB — BRAIN NATRIURETIC PEPTIDE: B NATRIURETIC PEPTIDE 5: 180 pg/mL — AB (ref 0.0–100.0)

## 2015-01-13 MED ORDER — IPRATROPIUM-ALBUTEROL 0.5-2.5 (3) MG/3ML IN SOLN
3.0000 mL | Freq: Once | RESPIRATORY_TRACT | Status: AC
  Administered 2015-01-13: 3 mL via RESPIRATORY_TRACT
  Filled 2015-01-13: qty 3

## 2015-01-13 MED ORDER — IPRATROPIUM-ALBUTEROL 0.5-2.5 (3) MG/3ML IN SOLN
3.0000 mL | Freq: Four times a day (QID) | RESPIRATORY_TRACT | Status: DC
  Administered 2015-01-14 (×4): 3 mL via RESPIRATORY_TRACT
  Filled 2015-01-13 (×4): qty 3

## 2015-01-13 MED ORDER — PANTOPRAZOLE SODIUM 40 MG PO TBEC
40.0000 mg | DELAYED_RELEASE_TABLET | Freq: Every day | ORAL | Status: DC
  Administered 2015-01-14 – 2015-01-15 (×3): 40 mg via ORAL
  Filled 2015-01-13 (×3): qty 1

## 2015-01-13 MED ORDER — METHYLPREDNISOLONE SODIUM SUCC 125 MG IJ SOLR
80.0000 mg | Freq: Two times a day (BID) | INTRAMUSCULAR | Status: DC
  Administered 2015-01-14: 80 mg via INTRAVENOUS
  Filled 2015-01-13: qty 2

## 2015-01-13 MED ORDER — ENSURE ENLIVE PO LIQD
237.0000 mL | Freq: Two times a day (BID) | ORAL | Status: DC
  Administered 2015-01-14 – 2015-01-15 (×4): 237 mL via ORAL

## 2015-01-13 MED ORDER — ENOXAPARIN SODIUM 80 MG/0.8ML ~~LOC~~ SOLN
1.0000 mg/kg | Freq: Once | SUBCUTANEOUS | Status: AC
  Administered 2015-01-13: 65 mg via SUBCUTANEOUS
  Filled 2015-01-13: qty 0.8

## 2015-01-13 MED ORDER — ATENOLOL 25 MG PO TABS
12.5000 mg | ORAL_TABLET | Freq: Every morning | ORAL | Status: DC
  Administered 2015-01-14 – 2015-01-16 (×3): 12.5 mg via ORAL
  Filled 2015-01-13 (×3): qty 1

## 2015-01-13 MED ORDER — IPRATROPIUM BROMIDE 0.02 % IN SOLN
0.5000 mg | Freq: Once | RESPIRATORY_TRACT | Status: AC
  Administered 2015-01-13: 0.5 mg via RESPIRATORY_TRACT
  Filled 2015-01-13: qty 2.5

## 2015-01-13 MED ORDER — SODIUM CHLORIDE 0.9 % IJ SOLN
3.0000 mL | Freq: Two times a day (BID) | INTRAMUSCULAR | Status: DC
  Administered 2015-01-14 – 2015-01-15 (×4): 3 mL via INTRAVENOUS

## 2015-01-13 MED ORDER — SODIUM CHLORIDE 0.9 % IJ SOLN
3.0000 mL | Freq: Two times a day (BID) | INTRAMUSCULAR | Status: DC
  Administered 2015-01-14 – 2015-01-15 (×4): 3 mL via INTRAVENOUS

## 2015-01-13 MED ORDER — IPRATROPIUM BROMIDE 0.02 % IN SOLN: 0.5000 mg | Freq: Once | RESPIRATORY_TRACT | Status: DC

## 2015-01-13 MED ORDER — METHYLPREDNISOLONE SODIUM SUCC 125 MG IJ SOLR
125.0000 mg | Freq: Once | INTRAMUSCULAR | Status: AC
  Administered 2015-01-13: 125 mg via INTRAVENOUS
  Filled 2015-01-13: qty 2

## 2015-01-13 MED ORDER — ALBUTEROL SULFATE (2.5 MG/3ML) 0.083% IN NEBU
2.5000 mg | INHALATION_SOLUTION | Freq: Once | RESPIRATORY_TRACT | Status: AC
  Administered 2015-01-13: 2.5 mg via RESPIRATORY_TRACT
  Filled 2015-01-13: qty 3

## 2015-01-13 MED ORDER — IPRATROPIUM BROMIDE 0.02 % IN SOLN: 0.5000 mg | Freq: Four times a day (QID) | RESPIRATORY_TRACT | Status: DC

## 2015-01-13 MED ORDER — SODIUM CHLORIDE 0.9 % IJ SOLN: 3.0000 mL | INTRAMUSCULAR | Status: DC | PRN

## 2015-01-13 MED ORDER — SODIUM CHLORIDE 0.9 % IV SOLN
INTRAVENOUS | Status: DC
  Administered 2015-01-13: 21:00:00 via INTRAVENOUS

## 2015-01-13 MED ORDER — GUAIFENESIN ER 600 MG PO TB12
600.0000 mg | ORAL_TABLET | Freq: Two times a day (BID) | ORAL | Status: DC
  Administered 2015-01-14 – 2015-01-16 (×6): 600 mg via ORAL
  Filled 2015-01-13 (×6): qty 1

## 2015-01-13 MED ORDER — LORAZEPAM 0.5 MG PO TABS
0.5000 mg | ORAL_TABLET | Freq: Every day | ORAL | Status: DC
  Administered 2015-01-14 – 2015-01-15 (×3): 0.5 mg via ORAL
  Filled 2015-01-13 (×3): qty 1

## 2015-01-13 MED ORDER — SODIUM CHLORIDE 0.9 % IV SOLN: 250.0000 mL | INTRAVENOUS | Status: DC | PRN

## 2015-01-13 MED ORDER — ALBUTEROL SULFATE (2.5 MG/3ML) 0.083% IN NEBU
5.0000 mg | INHALATION_SOLUTION | Freq: Once | RESPIRATORY_TRACT | Status: AC
  Administered 2015-01-13: 5 mg via RESPIRATORY_TRACT
  Filled 2015-01-13: qty 6

## 2015-01-13 MED ORDER — LEVOFLOXACIN 500 MG PO TABS
500.0000 mg | ORAL_TABLET | Freq: Every day | ORAL | Status: DC
  Administered 2015-01-14: 500 mg via ORAL
  Filled 2015-01-13: qty 1

## 2015-01-13 MED ORDER — ALBUTEROL SULFATE (2.5 MG/3ML) 0.083% IN NEBU: 2.5000 mg | INHALATION_SOLUTION | Freq: Four times a day (QID) | RESPIRATORY_TRACT | Status: DC

## 2015-01-13 MED ORDER — ESCITALOPRAM OXALATE 10 MG PO TABS
5.0000 mg | ORAL_TABLET | Freq: Every day | ORAL | Status: DC
  Administered 2015-01-14 – 2015-01-16 (×3): 5 mg via ORAL
  Filled 2015-01-13 (×3): qty 1

## 2015-01-13 MED ORDER — ALBUTEROL SULFATE (2.5 MG/3ML) 0.083% IN NEBU: 5.0000 mg | INHALATION_SOLUTION | Freq: Once | RESPIRATORY_TRACT | Status: DC

## 2015-01-13 MED ORDER — ALBUTEROL SULFATE (2.5 MG/3ML) 0.083% IN NEBU: 2.5000 mg | INHALATION_SOLUTION | RESPIRATORY_TRACT | Status: DC | PRN

## 2015-01-13 NOTE — ED Notes (Signed)
Pt c/o generalized weakness, sob that has gotten worse since being seen in er on 01/09/2015. Pt states that she was recently treated for bronchitis, denies any pain,

## 2015-01-13 NOTE — H&P (Signed)
PCP:   HAWKINS,EDWARD Carlean Jews, MD   Chief Complaint:  Coughing, sob  HPI: 82 yo female with h/o copd, htn, CAD, htn comes in for the second time this week with over 3 days of coughing, wheezing and feeling weak.  She came to ED 2 days ago, started her on prednisone.  Her PCP started her on levaquin 2 days ago.  Pt did spike a temp of 102 less than 48 hours ago but none since.  She has not felt better.  No chest pain.  A lot of nasal congestion.  No n/v/d.  No le edema or swelling or calf pain.  Pt referred for admission for postive d dimer and possible PE and also for her weakness.  Review of Systems:  Positive and negative as per HPI otherwise all other systems are negative  Past Medical History: Past Medical History  Diagnosis Date  . Hypertension   . Hypercholesteremia   . Coronary artery disease     2 stents per patient  . COPD (chronic obstructive pulmonary disease) ([place])   . Anxiety   . Depression   . Breast cancer ([place]) 2003    treated with surgery and Arimadex  . Ischemic colitis ([place]) 2009  . Dilated bile duct     noted on CT 2009, ampulla looked normal on EGD  . Aneurysm [facility])     thoracic aorta   Past Surgical History  Procedure Laterality Date  . Abdominal hysterectomy    . Hernia repair    . Cholecystectomy    . Breast surgery      left partial mastectomy 04/15/11, wide excision left breast  04/21/11, left partial mastectomy 12/19/02  . Appendectomy    . Colonoscopy  05/2007    Dr. Perfecto Kingdom, ulcerated, edematous area at 35-40cm from anal verge bx c/w ischemic colitis, frequent sigm tics, small internal hemrrhoids. SMA/IMA wide open on CT at that time.   . Esophagogastroduodenoscopy  05/2007    Dr. Malon Kindle ring, 2-3 cm HH, streaky erythema antrum with superficial erosions, bx gastritis without H.Pylori    Medications: Prior to Admission medications   Medication Sig Start Date End Date Taking? Authorizing Provider  albuterol (PROVENTIL  HFA;VENTOLIN HFA) 108 (90 BASE) MCG/ACT inhaler Inhale 2 puffs into the lungs 2 (two) times daily as needed. Shortness of Breath   Yes Historical Provider, MD  atenolol (TENORMIN) 25 MG tablet Take 12.5 mg by mouth every morning.    Yes Historical Provider, MD  Diphenhydramine-PE-APAP (MUCINEX FAST-MAX COLD FLU NGHT) 12.5-5-325 MG/10ML LIQD Take 20 mLs by mouth every 6 (six) hours as needed (Cold Symptoms).   Yes Historical Provider, MD  escitalopram (LEXAPRO) 5 MG tablet Take 5 mg by mouth daily.     Yes Historical Provider, MD  levofloxacin (LEVAQUIN) 500 MG tablet Take 500 mg by mouth daily.   Yes Historical Provider, MD  LORazepam (ATIVAN) 0.5 MG tablet Take 0.5 mg by mouth at bedtime.    Yes Historical Provider, MD  nitroGLYCERIN (NITROSTAT) 0.4 MG SL tablet Place 0.4 mg under the tongue every 5 (five) minutes as needed for chest pain.   Yes Historical Provider, MD  pantoprazole (PROTONIX) 40 MG tablet Take 40 mg by mouth at bedtime.    Yes Historical Provider, MD  predniSONE (STERAPRED UNI-PAK 21 TAB) 10 MG (21) TBPK tablet Take 6 tabs by mouth daily  for 2 days, then 5 tabs for 2 days, then 4 tabs for 2 days, then 3 tabs for 2 days, 2  tabs for 2 days, then 1 tab by mouth daily for 2 days 01/09/15  Yes Dorie Rank, MD  simvastatin (ZOCOR) 80 MG tablet Take 1 tablet by mouth at bedtime.  12/14/14  Yes Historical Provider, MD  sulfamethoxazole-trimethoprim (BACTRIM DS,SEPTRA DS) 800-160 MG tablet Take 1 tablet by mouth 2 (two) times daily. 12/03/14   Historical Provider, MD  traMADol (ULTRAM) 50 MG tablet Take 1 tablet by mouth daily as needed for moderate pain.  10/31/14   Historical Provider, MD    Allergies:   Allergies  Allergen Reactions  . Ceftin [Cefuroxime Axetil] Anaphylaxis  . Iohexol Hives and Shortness Of Breath    Pt states she got hives and was sob after last CT; pt refused IV CT contrast    Social History:  reports that she quit smoking 8 days ago. She does not have any  smokeless tobacco history on file. She reports that she does not drink alcohol or use illicit drugs.  Family History: Family History  Problem Relation Age of Onset  . Breast cancer Daughter   . Breast cancer Mother   . Breast cancer Sister   . Colon cancer Neg Hx     Physical Exam: Filed Vitals:   01/13/15 2131 01/13/15 2200 01/13/15 2215 01/13/15 2241  BP: 165/73 154/76 156/79   Pulse: 79 78 82   Temp:      TempSrc:      Resp: '23 18 24   '$ Height:      Weight:      SpO2: 89% 94% 94% 93%   General appearance: alert, cooperative and no distress Head: Normocephalic, without obvious abnormality, atraumatic Eyes: negative Nose: Nares normal. Septum midline. Mucosa normal. No drainage or sinus tenderness. Neck: no JVD and supple, symmetrical, trachea midline Lungs: wheezes bibasilar Heart: regular rate and rhythm, S1, S2 normal, no murmur, click, rub or gallop Abdomen: soft, non-tender; bowel sounds normal; no masses,  no organomegaly Extremities: extremities normal, atraumatic, no cyanosis or edema Pulses: 2+ and symmetric Skin: Skin color, texture, turgor normal. No rashes or lesions Neurologic: Grossly normal    Labs on Admission:   Recent Labs  01/13/15 2055  NA 142  K 4.3  CL 103  CO2 33*  GLUCOSE 134*  BUN 23*  CREATININE 0.61  CALCIUM 9.7    Recent Labs  01/13/15 2055  AST 27  ALT 41  ALKPHOS 55  BILITOT 0.6  PROT 6.5  ALBUMIN 3.6    Recent Labs  01/13/15 2055  WBC 7.4  NEUTROABS 6.2  HGB 14.2  HCT 43.9  MCV 99.1  PLT 142*    Recent Labs  01/13/15 2055  TROPONINI <0.03   Radiological Exams on Admission: Dg Chest 2 View  01/13/2015  CLINICAL DATA:  Acute onset of cough. Generalized weakness and shortness of breath. Initial encounter. EXAM: CHEST  2 VIEW COMPARISON:  Chest radiograph performed 01/09/2015 FINDINGS: The lungs are hyperexpanded, with flattening of the hemidiaphragms, compatible with COPD. Mild peribronchial thickening is  noted. There is no evidence of pleural effusion or pneumothorax. The heart is mildly enlarged. No acute osseous abnormalities are seen. Clips are seen overlying the left axilla. IMPRESSION: Findings of COPD.  Lungs otherwise appear clear.  Mild cardiomegaly. Electronically Signed   By: Garald Balding M.D.   On: 01/13/2015 22:07   Dg Chest 2 View  01/09/2015  CLINICAL DATA:  Cough and congestion since Saturday, coughing up stuff this morning, shortness of breath, history hypertension, coronary artery disease, COPD, breast  cancer, former smoker EXAM: CHEST  2 VIEW COMPARISON:  09/28/2014 FINDINGS: Enlargement of cardiac silhouette. Calcified tortuous aorta. Mediastinal contours and pulmonary vascularity normal. Emphysematous changes consistent with COPD. Chronic interstitial prominence and biapical scarring stable. No definite acute infiltrate, pleural effusion, or pneumothorax. Bones demineralized. IMPRESSION: Enlargement of cardiac silhouette. Changes of COPD and chronic interstitial disease without definite acute infiltrate. Electronically Signed   By: Lavonia Dana M.D.   On: 01/09/2015 13:53    Assessment/Plan  82 yo female with acute exacerbation of her copd  Principal Problem:   COPD exacerbation ([place])-  freq nebs.  Continue levaquin started by PCP.  Solumedrol.  Oxgyen.    Active Problems:   Coronary atherosclerosis- noted   Anxiety- noted   Generalized weakness- nonfocal.  Given some ivf in ED, will not continue   SOB (shortness of breath)- due to most likely copde   Chronic respiratory failure with hypoxia ([place])- noted, at baseline   D-dimer, elevated-  Will order vq in am as pt has dye allergy  obs on tele.  PCP dr Luan Pulling.  Jerrol Helmers A 01/13/2015, 11:07 PM

## 2015-01-13 NOTE — ED Provider Notes (Signed)
CSN: [id number]     Arrival date & time 01/13/15  1932 History   First MD Initiated Contact with Patient 01/13/15 2020     Chief Complaint  Patient presents with  . Fatigue     (Consider location/radiation/quality/duration/timing/severity/associated sxs/prior Treatment) HPI   [name] is a 82 y.o. female who presents for evaluation of shortness of breath and weakness, which is worsening over the last several days since being evaluated and treated here 01/09/2015. She is taking prednisone, and using her albuterol inhaler, without relief. She has had a low-grade fever. She has pain beneath the ribs, left anterior. She has decreased appetite but no vomiting. She is unable to walk at home because of the weakness. She is here with family members. There are no other known modifying factors.  Past Medical History  Diagnosis Date  . Hypertension   . Hypercholesteremia   . Coronary artery disease     2 stents per patient  . COPD (chronic obstructive pulmonary disease) ([place])   . Anxiety   . Depression   . Breast cancer ([place]) 2003    treated with surgery and Arimadex  . Ischemic colitis ([place]) 2009  . Dilated bile duct     noted on CT 2009, ampulla looked normal on EGD  . Aneurysm [facility])     thoracic aorta   Past Surgical History  Procedure Laterality Date  . Abdominal hysterectomy    . Hernia repair    . Cholecystectomy    . Breast surgery      left partial mastectomy 04/15/11, wide excision left breast  04/21/11, left partial mastectomy 12/19/02  . Appendectomy    . Colonoscopy  05/2007    Dr. Perfecto Kingdom, ulcerated, edematous area at 35-40cm from anal verge bx c/w ischemic colitis, frequent sigm tics, small internal hemrrhoids. SMA/IMA wide open on CT at that time.   . Esophagogastroduodenoscopy  05/2007    Dr. Malon Kindle ring, 2-3 cm HH, streaky erythema antrum with superficial erosions, bx gastritis without H.Pylori   Family History  Problem Relation Age of  Onset  . Breast cancer Daughter   . Breast cancer Mother   . Breast cancer Sister   . Colon cancer Neg Hx    Social History  Substance Use Topics  . Smoking status: Former Smoker    Quit date: 01/05/2015  . Smokeless tobacco: None  . Alcohol Use: No   OB History    No data available     Review of Systems  All other systems reviewed and are negative.     Allergies  Ceftin and Iohexol  Home Medications   Prior to Admission medications   Medication Sig Start Date End Date Taking? Authorizing Provider  albuterol (PROVENTIL HFA;VENTOLIN HFA) 108 (90 BASE) MCG/ACT inhaler Inhale 2 puffs into the lungs 2 (two) times daily as needed. Shortness of Breath   Yes Historical Provider, MD  atenolol (TENORMIN) 25 MG tablet Take 12.5 mg by mouth every morning.    Yes Historical Provider, MD  Diphenhydramine-PE-APAP (MUCINEX FAST-MAX COLD FLU NGHT) 12.5-5-325 MG/10ML LIQD Take 20 mLs by mouth every 6 (six) hours as needed (Cold Symptoms).   Yes Historical Provider, MD  escitalopram (LEXAPRO) 5 MG tablet Take 5 mg by mouth daily.     Yes Historical Provider, MD  levofloxacin (LEVAQUIN) 500 MG tablet Take 500 mg by mouth daily.   Yes Historical Provider, MD  LORazepam (ATIVAN) 0.5 MG tablet Take 0.5 mg by mouth at bedtime.  Yes Historical Provider, MD  nitroGLYCERIN (NITROSTAT) 0.4 MG SL tablet Place 0.4 mg under the tongue every 5 (five) minutes as needed for chest pain.   Yes Historical Provider, MD  pantoprazole (PROTONIX) 40 MG tablet Take 40 mg by mouth at bedtime.    Yes Historical Provider, MD  predniSONE (STERAPRED UNI-PAK 21 TAB) 10 MG (21) TBPK tablet Take 6 tabs by mouth daily  for 2 days, then 5 tabs for 2 days, then 4 tabs for 2 days, then 3 tabs for 2 days, 2 tabs for 2 days, then 1 tab by mouth daily for 2 days 01/09/15  Yes Dorie Rank, MD  simvastatin (ZOCOR) 80 MG tablet Take 1 tablet by mouth at bedtime.  12/14/14  Yes Historical Provider, MD  sulfamethoxazole-trimethoprim  (BACTRIM DS,SEPTRA DS) 800-160 MG tablet Take 1 tablet by mouth 2 (two) times daily. 12/03/14   Historical Provider, MD  traMADol (ULTRAM) 50 MG tablet Take 1 tablet by mouth daily as needed for moderate pain.  10/31/14   Historical Provider, MD   BP 156/79 mmHg  Pulse 82  Temp(Src) 98.1 F (36.7 C) (Oral)  Resp 24  Ht '5\' 5"'$  (1.651 m)  Wt 140 lb (63.504 kg)  BMI 23.30 kg/m2  SpO2 94% Physical Exam  Constitutional: She is oriented to person, place, and time. She appears well-developed.  Elderly, frail  HENT:  Head: Normocephalic and atraumatic.  Right Ear: External ear normal.  Left Ear: External ear normal.  Eyes: Conjunctivae and EOM are normal. Pupils are equal, round, and reactive to light.  Neck: Normal range of motion and phonation normal. Neck supple.  Cardiovascular: Normal rate, regular rhythm and normal heart sounds.   Pulmonary/Chest: Effort normal. No respiratory distress. She has wheezes (Scattered). She has no rales. She exhibits no bony tenderness.  Mild decreased air movement bilaterally. No increased work of breathing.  Abdominal: Soft. There is no tenderness.  Musculoskeletal: Normal range of motion.  Neurological: She is alert and oriented to person, place, and time. No cranial nerve deficit or sensory deficit. She exhibits normal muscle tone. Coordination normal.  Skin: Skin is warm, dry and intact.  Psychiatric: She has a normal mood and affect. Her behavior is normal. Judgment and thought content normal.  Nursing note and vitals reviewed.   ED Course  Procedures (including critical care time)  Medications  0.9 %  sodium chloride infusion ( Intravenous New Bag/Given 01/13/15 2058)  albuterol (PROVENTIL) (2.5 MG/3ML) 0.083% nebulizer solution 5 mg (not administered)  ipratropium (ATROVENT) nebulizer solution 0.5 mg (not administered)  albuterol (PROVENTIL) (2.5 MG/3ML) 0.083% nebulizer solution 5 mg (5 mg Nebulization Given 01/13/15 2058)  ipratropium (ATROVENT)  nebulizer solution 0.5 mg (0.5 mg Nebulization Given 01/13/15 2058)  methylPREDNISolone sodium succinate (SOLU-MEDROL) 125 mg/2 mL injection 125 mg (125 mg Intravenous Given 01/13/15 2058)    Patient Vitals for the past 24 hrs:  BP Temp Temp src Pulse Resp SpO2 Height Weight  01/13/15 2215 156/79 mmHg - - 82 24 94 % - -  01/13/15 2200 154/76 mmHg - - 78 18 94 % - -  01/13/15 2131 165/73 mmHg - - 79 23 (!) 89 % - -  01/13/15 2117 - - - - - 97 % - -  01/13/15 2115 - - - 71 18 94 % - -  01/13/15 2100 151/72 mmHg - - - (!) 28 - - -  01/13/15 2030 139/75 mmHg - - 66 22 97 % - -  01/13/15 2000 156/83 mmHg - -  70 23 96 % - -  01/13/15 1935 150/72 mmHg 98.1 F (36.7 C) Oral 66 20 - '5\' 5"'$  (1.651 m) 140 lb (63.504 kg)    10:15 PM Reevaluation with update and discussion. After initial assessment and treatment, an updated evaluation reveals she remains uncomfortable. Lung exam is unchanged. She continues to have decreased air movement bilaterally with scattered rhonchi and a few wheezes. She is able to speak in full sentences. There is no respiratory distress at this time. Daysie Helf L   10:24 PM-Consult complete with Dr. Shanon Brow. Patient case explained and discussed. She agrees to admit patient for further evaluation and treatment. Call ended at [facility] Performed by: Richarda Blade Total critical care time: 35 minutes Critical care time was exclusive of separately billable procedures and treating other patients. Critical care was necessary to treat or prevent imminent or life-threatening deterioration. Critical care was time spent personally by me on the following activities: development of treatment plan with patient and/or surrogate as well as nursing, discussions with consultants, evaluation of patient's response to treatment, examination of patient, obtaining history from patient or surrogate, ordering and performing treatments and interventions, ordering and review of laboratory  studies, ordering and review of radiographic studies, pulse oximetry and re-evaluation of patient's condition.   Labs Review Labs Reviewed  COMPREHENSIVE METABOLIC PANEL - Abnormal; Notable for the following:    CO2 33 (*)    Glucose, Bld 134 (*)    BUN 23 (*)    All other components within normal limits  CBC WITH DIFFERENTIAL/PLATELET - Abnormal; Notable for the following:    Platelets 142 (*)    All other components within normal limits  D-DIMER, QUANTITATIVE (NOT AT [facility]) - Abnormal; Notable for the following:    D-Dimer, Quant 1.66 (*)    All other components within normal limits  BRAIN NATRIURETIC PEPTIDE - Abnormal; Notable for the following:    B Natriuretic Peptide 180.0 (*)    All other components within normal limits  URINE CULTURE  TROPONIN I  URINALYSIS, ROUTINE W REFLEX MICROSCOPIC (NOT AT [facility])    Imaging Review Dg Chest 2 View  01/13/2015  CLINICAL DATA:  Acute onset of cough. Generalized weakness and shortness of breath. Initial encounter. EXAM: CHEST  2 VIEW COMPARISON:  Chest radiograph performed 01/09/2015 FINDINGS: The lungs are hyperexpanded, with flattening of the hemidiaphragms, compatible with COPD. Mild peribronchial thickening is noted. There is no evidence of pleural effusion or pneumothorax. The heart is mildly enlarged. No acute osseous abnormalities are seen. Clips are seen overlying the left axilla. IMPRESSION: Findings of COPD.  Lungs otherwise appear clear.  Mild cardiomegaly. Electronically Signed   By: Garald Balding M.D.   On: 01/13/2015 22:07   I have personally reviewed and evaluated these images and lab results as part of my medical decision-making.   EKG Interpretation   Date/Time:  Sunday January 13 2015 19:40:02 EST Ventricular Rate:  65 PR Interval:  201 QRS Duration: 79 QT Interval:  381 QTC Calculation: 396 R Axis:   45 Text Interpretation:  Sinus rhythm Abnormal R-wave progression, early  transition since last tracing no  significant change Confirmed by Eulis Foster   MD, Theodore Rahrig ([id number]) on 01/13/2015 8:23:54 PM      MDM   Final diagnoses:  COPD exacerbation ([place])    Evaluation is consistent with exacerbation of COPD, without evidence for pneumonia. Doubt CHF exacerbation. Last cardiac echo 2013 with ejection fraction 60%. Doubt sepsis, metabolic instability or impending vascular collapse.  Patient failed outpatient treatment, which is ongoing for 4 days. D-dimer was ordered for screening purposes, and is elevated. Patient is allergic to IV contrast dye. Therefore, will need to be evaluated with a VQ scan for possible PE. I believe that she can be treated with anticoagulants pending, VQ study in the morning. Her clinical exam is more consistent with COPD exacerbation, PE as a source of her shortness of breath.  Nursing Notes Reviewed/ Care Coordinated, and agree without changes. Applicable Imaging Reviewed.  Interpretation of Laboratory Data incorporated into ED treatment  Plan: Admit    Daleen Bo, MD 01/18/15 1057

## 2015-01-14 ENCOUNTER — Observation Stay (HOSPITAL_COMMUNITY): Payer: Medicare Other

## 2015-01-14 DIAGNOSIS — R0602 Shortness of breath: Secondary | ICD-10-CM | POA: Diagnosis not present

## 2015-01-14 DIAGNOSIS — I10 Essential (primary) hypertension: Secondary | ICD-10-CM | POA: Diagnosis present

## 2015-01-14 DIAGNOSIS — E7800 Pure hypercholesterolemia, unspecified: Secondary | ICD-10-CM | POA: Diagnosis present

## 2015-01-14 DIAGNOSIS — J9621 Acute and chronic respiratory failure with hypoxia: Secondary | ICD-10-CM | POA: Diagnosis present

## 2015-01-14 DIAGNOSIS — F419 Anxiety disorder, unspecified: Secondary | ICD-10-CM | POA: Diagnosis present

## 2015-01-14 DIAGNOSIS — R05 Cough: Secondary | ICD-10-CM | POA: Diagnosis not present

## 2015-01-14 DIAGNOSIS — Z91041 Radiographic dye allergy status: Secondary | ICD-10-CM | POA: Diagnosis not present

## 2015-01-14 DIAGNOSIS — Z853 Personal history of malignant neoplasm of breast: Secondary | ICD-10-CM | POA: Diagnosis not present

## 2015-01-14 DIAGNOSIS — Z87891 Personal history of nicotine dependence: Secondary | ICD-10-CM | POA: Diagnosis not present

## 2015-01-14 DIAGNOSIS — I2510 Atherosclerotic heart disease of native coronary artery without angina pectoris: Secondary | ICD-10-CM | POA: Diagnosis present

## 2015-01-14 DIAGNOSIS — J441 Chronic obstructive pulmonary disease with (acute) exacerbation: Secondary | ICD-10-CM | POA: Diagnosis not present

## 2015-01-14 DIAGNOSIS — J9601 Acute respiratory failure with hypoxia: Secondary | ICD-10-CM | POA: Diagnosis not present

## 2015-01-14 DIAGNOSIS — Z803 Family history of malignant neoplasm of breast: Secondary | ICD-10-CM | POA: Diagnosis not present

## 2015-01-14 LAB — BASIC METABOLIC PANEL
Anion gap: 9 (ref 5–15)
BUN: 24 mg/dL — AB (ref 6–20)
CALCIUM: 9.5 mg/dL (ref 8.9–10.3)
CO2: 32 mmol/L (ref 22–32)
Chloride: 103 mmol/L (ref 101–111)
Creatinine, Ser: 0.74 mg/dL (ref 0.44–1.00)
GFR calc Af Amer: >60 mL/min (ref >60)
GLUCOSE: 139 mg/dL — AB (ref 65–99)
Potassium: 4 mmol/L (ref 3.5–5.1)
Sodium: 144 mmol/L (ref 135–145)

## 2015-01-14 LAB — CBC
HEMATOCRIT: 43.4 % (ref 36.0–46.0)
Hemoglobin: 14 g/dL (ref 12.0–15.0)
MCH: 32.2 pg (ref 26.0–34.0)
MCHC: 32.3 g/dL (ref 30.0–36.0)
MCV: 99.8 fL (ref 78.0–100.0)
Platelets: 138 10*3/uL — ABNORMAL LOW (ref 150–400)
RBC: 4.35 MIL/uL (ref 3.87–5.11)
RDW: 13 % (ref 11.5–15.5)
WBC: 8.1 10*3/uL (ref 4.0–10.5)

## 2015-01-14 LAB — INFLUENZA PANEL BY PCR (TYPE A & B)
H1N1 flu by pcr: NOT DETECTED
Influenza A By PCR: NEGATIVE
Influenza B By PCR: NEGATIVE

## 2015-01-14 MED ORDER — LEVOFLOXACIN 500 MG PO TABS
500.0000 mg | ORAL_TABLET | ORAL | Status: DC
  Administered 2015-01-16: 500 mg via ORAL
  Filled 2015-01-14: qty 1

## 2015-01-14 MED ORDER — SENNA 8.6 MG PO TABS
1.0000 | ORAL_TABLET | Freq: Every day | ORAL | Status: DC | PRN
  Administered 2015-01-14 – 2015-01-15 (×2): 8.6 mg via ORAL
  Filled 2015-01-14 (×2): qty 1

## 2015-01-14 MED ORDER — TECHNETIUM TC 99M DIETHYLENETRIAME-PENTAACETIC ACID
30.0000 | Freq: Once | INTRAVENOUS | Status: AC | PRN
  Administered 2015-01-14: 33 via RESPIRATORY_TRACT

## 2015-01-14 MED ORDER — LORAZEPAM 0.5 MG PO TABS: 0.5000 mg | ORAL_TABLET | Freq: Every evening | ORAL | Status: DC | PRN

## 2015-01-14 MED ORDER — TECHNETIUM TO 99M ALBUMIN AGGREGATED
4.0000 | Freq: Once | INTRAVENOUS | Status: AC | PRN
  Administered 2015-01-14: 4.2 via INTRAVENOUS

## 2015-01-14 MED ORDER — IPRATROPIUM-ALBUTEROL 0.5-2.5 (3) MG/3ML IN SOLN
3.0000 mL | Freq: Three times a day (TID) | RESPIRATORY_TRACT | Status: DC
Start: 2015-01-15 — End: 2015-01-16
  Administered 2015-01-15 – 2015-01-16 (×4): 3 mL via RESPIRATORY_TRACT
  Filled 2015-01-14 (×4): qty 3

## 2015-01-14 MED ORDER — METHYLPREDNISOLONE SODIUM SUCC 125 MG IJ SOLR
80.0000 mg | Freq: Two times a day (BID) | INTRAMUSCULAR | Status: DC
  Administered 2015-01-14 – 2015-01-16 (×4): 80 mg via INTRAVENOUS
  Filled 2015-01-14 (×4): qty 2

## 2015-01-14 NOTE — Care Management Note (Signed)
Case Management Note  Patient Details  Name: [name] MRN: [id number] Date of Birth: 05-Jan-1926  Subjective/Objective:                  Pt is from home, lives alone and is ind with ADL's. Pt has cane and walker but does not use them. Pt has home O2 concentrator for nighttime use. Pt does not have port tanks or neb machine. Pt has no HH services prior to admission. Pt plans to return home with self care at DC.   Action/Plan: No CM needs.   Expected Discharge Date:     01/13/2014             Expected Discharge Plan:  Home/Self Care  In-House Referral:  NA  Discharge planning Services  CM Consult  Post Acute Care Choice:  NA Choice offered to:  NA  DME Arranged:    DME Agency:     HH Arranged:    HH Agency:     Status of Service:  In process, will continue to follow  Medicare Important Message Given:    Date Medicare IM Given:    Medicare IM give by:    Date Additional Medicare IM Given:    Additional Medicare Important Message give by:     If discussed at [place] of Stay Meetings, dates discussed:    Additional Comments:  Sherald Barge, RN 01/14/2015, 2:52 PM

## 2015-01-14 NOTE — Progress Notes (Signed)
Subjective: She was admitted with COPD exacerbation and acute on chronic respiratory failure. Her d-dimer was elevated and she just had ventilation/perfusion lung scan but results are not available yet. She is still short of breath.  Objective: Vital signs in last 24 hours: Temp:  [98.1 F (36.7 C)-98.5 F (36.9 C)] 98.1 F (36.7 C) (01/02 0648) Pulse Rate:  [66-92] 86 (01/02 0925) Resp:  [18-28] 18 (01/02 0903) BP: (128-165)/(66-83) 136/78 mmHg (01/02 0925) SpO2:  [89 %-97 %] 95 % (01/02 0903) Weight:  [63.504 kg (140 lb)-65.545 kg (144 lb 8 oz)] 65.545 kg (144 lb 8 oz) (01/01 2315) Weight change:  Last BM Date: 01/13/15  Intake/Output from previous day: 01/01 0701 - 01/02 0700 In: -  Out: 200 [Urine:200]  PHYSICAL EXAM General appearance: alert, cooperative and mild distress Resp: rhonchi bilaterally Cardio: regular rate and rhythm, S1, S2 normal, no murmur, click, rub or gallop GI: soft, non-tender; bowel sounds normal; no masses,  no organomegaly Extremities: extremities normal, atraumatic, no cyanosis or edema  Lab Results:  Results for orders placed or performed during the hospital encounter of 01/13/15 (from the past 48 hour(s))  Comprehensive metabolic panel     Status: Abnormal   Collection Time: 01/13/15  8:55 PM  Result Value Ref Range   Sodium 142 135 - 145 mmol/L   Potassium 4.3 3.5 - 5.1 mmol/L   Chloride 103 101 - 111 mmol/L   CO2 33 (H) 22 - 32 mmol/L   Glucose, Bld 134 (H) 65 - 99 mg/dL   BUN 23 (H) 6 - 20 mg/dL   Creatinine, Ser 0.61 0.44 - 1.00 mg/dL   Calcium 9.7 8.9 - 10.3 mg/dL   Total Protein 6.5 6.5 - 8.1 g/dL   Albumin 3.6 3.5 - 5.0 g/dL   AST 27 15 - 41 U/L   ALT 41 14 - 54 U/L   Alkaline Phosphatase 55 38 - 126 U/L   Total Bilirubin 0.6 0.3 - 1.2 mg/dL   GFR calc non Af Amer >60 >60 mL/min   GFR calc Af Amer >60 >60 mL/min    Comment: (NOTE) The eGFR has been calculated using the CKD EPI equation. This calculation has not been validated  in all clinical situations. eGFR's persistently <60 mL/min signify possible Chronic Kidney Disease.    Anion gap 6 5 - 15  CBC with Differential     Status: Abnormal   Collection Time: 01/13/15  8:55 PM  Result Value Ref Range   WBC 7.4 4.0 - 10.5 K/uL   RBC 4.43 3.87 - 5.11 MIL/uL   Hemoglobin 14.2 12.0 - 15.0 g/dL   HCT 43.9 36.0 - 46.0 %   MCV 99.1 78.0 - 100.0 fL   MCH 32.1 26.0 - 34.0 pg   MCHC 32.3 30.0 - 36.0 g/dL   RDW 13.1 11.5 - 15.5 %   Platelets 142 (L) 150 - 400 K/uL   Neutrophils Relative % 83 %   Neutro Abs 6.2 1.7 - 7.7 K/uL   Lymphocytes Relative 13 %   Lymphs Abs 1.0 0.7 - 4.0 K/uL   Monocytes Relative 4 %   Monocytes Absolute 0.3 0.1 - 1.0 K/uL   Eosinophils Relative 0 %   Eosinophils Absolute 0.0 0.0 - 0.7 K/uL   Basophils Relative 0 %   Basophils Absolute 0.0 0.0 - 0.1 K/uL  D-dimer, quantitative     Status: Abnormal   Collection Time: 01/13/15  8:55 PM  Result Value Ref Range   D-Dimer, America Brown  1.66 (H) 0.00 - 0.50 ug/mL-FEU    Comment: (NOTE) At the manufacturer cut-off of 0.50 ug/mL FEU, this assay has been documented to exclude PE with a sensitivity and negative predictive value of 97 to 99%.  At this time, this assay has not been approved by the FDA to exclude DVT/VTE. Results should be correlated with clinical presentation.   Troponin I     Status: None   Collection Time: 01/13/15  8:55 PM  Result Value Ref Range   Troponin I <0.03 <0.031 ng/mL    Comment:        NO INDICATION OF MYOCARDIAL INJURY.   Brain natriuretic peptide     Status: Abnormal   Collection Time: 01/13/15  8:55 PM  Result Value Ref Range   B Natriuretic Peptide 180.0 (H) 0.0 - 100.0 pg/mL  Urinalysis, Routine w reflex microscopic     Status: None   Collection Time: 01/13/15  9:32 PM  Result Value Ref Range   Color, Urine YELLOW YELLOW   APPearance CLEAR CLEAR   Specific Gravity, Urine 1.015 1.005 - 1.030   pH 7.5 5.0 - 8.0   Glucose, UA NEGATIVE NEGATIVE mg/dL   Hgb  urine dipstick NEGATIVE NEGATIVE   Bilirubin Urine NEGATIVE NEGATIVE   Ketones, ur NEGATIVE NEGATIVE mg/dL   Protein, ur NEGATIVE NEGATIVE mg/dL   Nitrite NEGATIVE NEGATIVE   Leukocytes, UA NEGATIVE NEGATIVE    Comment: MICROSCOPIC NOT DONE ON URINES WITH NEGATIVE PROTEIN, BLOOD, LEUKOCYTES, NITRITE, OR GLUCOSE <1000 mg/dL.  Influenza panel by PCR (type A & B, H1N1)     Status: None   Collection Time: 01/13/15 11:52 PM  Result Value Ref Range   Influenza A By PCR NEGATIVE NEGATIVE   Influenza B By PCR NEGATIVE NEGATIVE   H1N1 flu by pcr NOT DETECTED NOT DETECTED    Comment:        The Xpert Flu assay (FDA approved for nasal aspirates or washes and nasopharyngeal swab specimens), is intended as an aid in the diagnosis of influenza and should not be used as a sole basis for treatment.   Basic metabolic panel     Status: Abnormal   Collection Time: 01/14/15  6:24 AM  Result Value Ref Range   Sodium 144 135 - 145 mmol/L   Potassium 4.0 3.5 - 5.1 mmol/L   Chloride 103 101 - 111 mmol/L   CO2 32 22 - 32 mmol/L   Glucose, Bld 139 (H) 65 - 99 mg/dL   BUN 24 (H) 6 - 20 mg/dL   Creatinine, Ser 0.74 0.44 - 1.00 mg/dL   Calcium 9.5 8.9 - 10.3 mg/dL   GFR calc non Af Amer >60 >60 mL/min   GFR calc Af Amer >60 >60 mL/min    Comment: (NOTE) The eGFR has been calculated using the CKD EPI equation. This calculation has not been validated in all clinical situations. eGFR's persistently <60 mL/min signify possible Chronic Kidney Disease.    Anion gap 9 5 - 15  CBC     Status: Abnormal   Collection Time: 01/14/15  6:24 AM  Result Value Ref Range   WBC 8.1 4.0 - 10.5 K/uL   RBC 4.35 3.87 - 5.11 MIL/uL   Hemoglobin 14.0 12.0 - 15.0 g/dL   HCT 43.4 36.0 - 46.0 %   MCV 99.8 78.0 - 100.0 fL   MCH 32.2 26.0 - 34.0 pg   MCHC 32.3 30.0 - 36.0 g/dL   RDW 13.0 11.5 - 15.5 %  Platelets 138 (L) 150 - 400 K/uL    ABGS No results for input(s): PHART, PO2ART, TCO2, HCO3 in the last 72  hours.  Invalid input(s): PCO2 CULTURES No results found for this or any previous visit (from the past 240 hour(s)). Studies/Results: Dg Chest 2 View  01/13/2015  CLINICAL DATA:  Acute onset of cough. Generalized weakness and shortness of breath. Initial encounter. EXAM: CHEST  2 VIEW COMPARISON:  Chest radiograph performed 01/09/2015 FINDINGS: The lungs are hyperexpanded, with flattening of the hemidiaphragms, compatible with COPD. Mild peribronchial thickening is noted. There is no evidence of pleural effusion or pneumothorax. The heart is mildly enlarged. No acute osseous abnormalities are seen. Clips are seen overlying the left axilla. IMPRESSION: Findings of COPD.  Lungs otherwise appear clear.  Mild cardiomegaly. Electronically Signed   By: Garald Balding M.D.   On: 01/13/2015 22:07    Medications:  Prior to Admission:  Prescriptions prior to admission  Medication Sig Dispense Refill Last Dose  . albuterol (PROVENTIL HFA;VENTOLIN HFA) 108 (90 BASE) MCG/ACT inhaler Inhale 2 puffs into the lungs 2 (two) times daily as needed. Shortness of Breath   01/13/2015  . atenolol (TENORMIN) 25 MG tablet Take 12.5 mg by mouth every morning.    01/13/2015 at 0830  . Diphenhydramine-PE-APAP (MUCINEX FAST-MAX COLD FLU NGHT) 12.5-5-325 MG/10ML LIQD Take 20 mLs by mouth every 6 (six) hours as needed (Cold Symptoms).   01/13/2015  . escitalopram (LEXAPRO) 5 MG tablet Take 5 mg by mouth daily.     01/13/2015  . levofloxacin (LEVAQUIN) 500 MG tablet Take 500 mg by mouth daily.   01/13/2015  . LORazepam (ATIVAN) 0.5 MG tablet Take 0.5 mg by mouth at bedtime.    01/12/2015  . nitroGLYCERIN (NITROSTAT) 0.4 MG SL tablet Place 0.4 mg under the tongue every 5 (five) minutes as needed for chest pain.   Past Week  . pantoprazole (PROTONIX) 40 MG tablet Take 40 mg by mouth at bedtime.    01/12/2015  . predniSONE (STERAPRED UNI-PAK 21 TAB) 10 MG (21) TBPK tablet Take 6 tabs by mouth daily  for 2 days, then 5 tabs for 2 days, then  4 tabs for 2 days, then 3 tabs for 2 days, 2 tabs for 2 days, then 1 tab by mouth daily for 2 days 42 tablet 0 01/13/2015  . simvastatin (ZOCOR) 80 MG tablet Take 1 tablet by mouth at bedtime.    01/12/2015  . sulfamethoxazole-trimethoprim (BACTRIM DS,SEPTRA DS) 800-160 MG tablet Take 1 tablet by mouth 2 (two) times daily.   01/09/2015 at Unknown time  . traMADol (ULTRAM) 50 MG tablet Take 1 tablet by mouth daily as needed for moderate pain.    More Than A Month   Scheduled: . atenolol  12.5 mg Oral q morning - 10a  . escitalopram  5 mg Oral Daily  . feeding supplement (ENSURE ENLIVE)  237 mL Oral BID BM  . guaiFENesin  600 mg Oral BID  . ipratropium-albuterol  3 mL Nebulization Q6H  . levofloxacin  500 mg Oral Daily  . LORazepam  0.5 mg Oral QHS  . methylPREDNISolone (SOLU-MEDROL) injection  80 mg Intravenous Q12H  . pantoprazole  40 mg Oral QHS  . sodium chloride  3 mL Intravenous Q12H  . sodium chloride  3 mL Intravenous Q12H   Continuous:  IFO:YDXAJO chloride, albuterol, sodium chloride  Assesment: She was admitted with COPD exacerbation and she is on appropriate treatment. She has multiple other medical problems including chronic  hypoxic respiratory failure. She has coronary artery occlusive disease but no chest pain. She has significant anxiety Principal Problem:   COPD exacerbation (H[facility]) Active Problems:   Coronary atherosclerosis   Anxiety   Generalized weakness   SOB (shortness of breath)   Chronic respiratory failure with hypoxia (H[facility])   D-dimer, elevated    Plan: Continue current treatments. Await ventilation/perfusion lung scan.      [name] L 01/14/2015, 11:59 AM

## 2015-01-14 NOTE — Progress Notes (Signed)
PT Cancellation Note  Patient Details Name: [name] MRN: [id number] DOB: 1925/11/06   Cancelled Treatment:    Reason Eval/Treat Not Completed: Patient not medically ready;Patient at procedure or test/unavailable. Pt with elevated D-dimer, suspected PE, awaiting VQ scan. [facility] policy requires all new PE to wait 2 days after therapeutic anticoagulation has been administered, prior to mobilization with PT. Will hold PT evaluation until results from VQ are available and proceed as appropriate.    Braidan Ricciardi C 01/14/2015, 12:33 PM  12:36 PM  Etta Grandchild, PT, DPT [place] License # [id number]

## 2015-01-15 LAB — URINE CULTURE: CULTURE: NO GROWTH

## 2015-01-15 NOTE — Consult Note (Signed)
   [facility] CM Inpatient Consult   01/15/2015  [place] DOST 1925/09/24 [id number]  Spoke with patient at bedside regarding [facility] services. Patient does not want to participate with [facility] at this time. Stating she feels she is doing well on her own in the community and does not feel she needs [facility] services at this time but would consider if her situation was to change. Patient given [facility] brochure and contact information for future reference.   Of note, [facility] Care Management services would not replace or interfere with any services that are arranged by inpatient case management or social work.  For additional questions or referrals please contact:  Royetta Crochet. Laymond Purser, RN, BSN, [facility] Liaison [phone]

## 2015-01-15 NOTE — Evaluation (Signed)
Physical Therapy Evaluation Patient Details Name: [name] MRN: [id number] DOB: 1925-07-25 Today's Date: 01/15/2015   History of Present Illness  82 yo female with h/o copd, htn, CAD, htn comes in for the second time this week with over 3 days of coughing, wheezing and feeling weak. She came to ED 2 days ago, started her on prednisone. Her PCP started her on levaquin 2 days ago. Pt did spike a temp of 102 less than 48 hours ago but none since. She has not felt better. No chest pain. A lot of nasal congestion. No n/v/d. No le edema or swelling or calf pain. Pt referred for admission for postive d dimer and possible PE and also for her weakness.  Clinical Impression  Pt was seen for evaluation and found to be close to prior functional level.  She lives alone in a senior citizen apartment and has an excellent support system of neighbors and family.  She wears O2 at night.  Scans for PE are negative.  She was on 1.5 L O2 at the time of my visit.  She stated that she has been up in the room on room air so gait was evaluated on room air.  After ambulating 175' her O2 sat was checked and found to be 84%.  Supplemental O2 was resumed at 1.5 L /min and O2 sat returned to 95% within a minute.  She should be able to discharge to home at d/c with no PT needs.    Follow Up Recommendations No PT follow up    Equipment Recommendations  None recommended by PT    Recommendations for Other Services   none    Precautions / Restrictions Precautions Precautions: None Restrictions Weight Bearing Restrictions: No      Mobility  Bed Mobility Overal bed mobility: Independent                Transfers Overall transfer level: Independent                  Ambulation/Gait Ambulation/Gait assistance: Modified independent (Device/Increase time) Ambulation Distance (Feet): 175 Feet Assistive device: None Gait Pattern/deviations: WFL(Within Functional Limits)   Gait velocity  interpretation: >2.62 ft/sec, indicative of independent community ambulator General Gait Details: on room air for gait but developed dyspnea.Marland KitchenMarland KitchenO2 sat=84%  Stairs            Wheelchair Mobility    Modified Rankin (Stroke Patients Only)       Balance Overall balance assessment: No apparent balance deficits (not formally assessed)                                           Pertinent Vitals/Pain Pain Assessment: No/denies pain    Home Living Family/patient expects to be discharged to:: Private residence Living Arrangements: Alone Available Help at Discharge: Family;Neighbor;Available PRN/intermittently Type of Home: Apartment Home Access: Level entry       Home Equipment: Walker - 2 wheels;Cane - single point      Prior Function Level of Independence: Independent               Hand Dominance        Extremity/Trunk Assessment               Lower Extremity Assessment: Overall WFL for tasks assessed      Cervical / Trunk Assessment: Normal  Communication   Communication: No  difficulties  Cognition Arousal/Alertness: Awake/alert Behavior During Therapy: WFL for tasks assessed/performed Overall Cognitive Status: Within Functional Limits for tasks assessed                      General Comments      Exercises        Assessment/Plan    PT Assessment Patent does not need any further PT services  PT Diagnosis     PT Problem List    PT Treatment Interventions     PT Goals (Current goals can be found in the Care Plan section) Acute Rehab PT Goals PT Goal Formulation: All assessment and education complete, DC therapy    Frequency     Barriers to discharge        Co-evaluation               End of Session Equipment Utilized During Treatment: Gait belt Activity Tolerance: Patient tolerated treatment well Patient left: in chair;with call bell/phone within reach           Time: 1055-1116 PT Time  Calculation (min) (ACUTE ONLY): 21 min   Charges:   PT Evaluation $PT Eval Low Complexity: 1 Procedure     PT G CodesDemetrios Isaacs L  PT 01/15/2015, 11:20 AM [phone]

## 2015-01-15 NOTE — Progress Notes (Signed)
Subjective: She feels significantly better. She was low risk for pulmonary embolus and I think that that fits her clinical situation.  Objective: Vital signs in last 24 hours: Temp:  [98 F (36.7 C)-98.7 F (37.1 C)] 98.6 F (37 C) (01/03 0459) Pulse Rate:  [77-104] 87 (01/03 0459) Resp:  [18-20] 20 (01/03 0459) BP: (128-148)/(63-78) 148/77 mmHg (01/03 0459) SpO2:  [95 %-97 %] 96 % (01/03 0736) Weight change:  Last BM Date: 01/13/15  Intake/Output from previous day: 01/02 0701 - 01/03 0700 In: 723 [P.O.:720; I.V.:3] Out: 650 [Urine:650]  PHYSICAL EXAM General appearance: alert, cooperative and no distress Resp: clear to auscultation bilaterally Cardio: regular rate and rhythm, S1, S2 normal, no murmur, click, rub or gallop GI: soft, non-tender; bowel sounds normal; no masses,  no organomegaly Extremities: extremities normal, atraumatic, no cyanosis or edema  Lab Results:  Results for orders placed or performed during the hospital encounter of 01/13/15 (from the past 48 hour(s))  Comprehensive metabolic panel     Status: Abnormal   Collection Time: 01/13/15  8:55 PM  Result Value Ref Range   Sodium 142 135 - 145 mmol/L   Potassium 4.3 3.5 - 5.1 mmol/L   Chloride 103 101 - 111 mmol/L   CO2 33 (H) 22 - 32 mmol/L   Glucose, Bld 134 (H) 65 - 99 mg/dL   BUN 23 (H) 6 - 20 mg/dL   Creatinine, Ser 0.61 0.44 - 1.00 mg/dL   Calcium 9.7 8.9 - 10.3 mg/dL   Total Protein 6.5 6.5 - 8.1 g/dL   Albumin 3.6 3.5 - 5.0 g/dL   AST 27 15 - 41 U/L   ALT 41 14 - 54 U/L   Alkaline Phosphatase 55 38 - 126 U/L   Total Bilirubin 0.6 0.3 - 1.2 mg/dL   GFR calc non Af Amer >60 >60 mL/min   GFR calc Af Amer >60 >60 mL/min    Comment: (NOTE) The eGFR has been calculated using the CKD EPI equation. This calculation has not been validated in all clinical situations. eGFR's persistently <60 mL/min signify possible Chronic Kidney Disease.    Anion gap 6 5 - 15  CBC with Differential     Status:  Abnormal   Collection Time: 01/13/15  8:55 PM  Result Value Ref Range   WBC 7.4 4.0 - 10.5 K/uL   RBC 4.43 3.87 - 5.11 MIL/uL   Hemoglobin 14.2 12.0 - 15.0 g/dL   HCT 43.9 36.0 - 46.0 %   MCV 99.1 78.0 - 100.0 fL   MCH 32.1 26.0 - 34.0 pg   MCHC 32.3 30.0 - 36.0 g/dL   RDW 13.1 11.5 - 15.5 %   Platelets 142 (L) 150 - 400 K/uL   Neutrophils Relative % 83 %   Neutro Abs 6.2 1.7 - 7.7 K/uL   Lymphocytes Relative 13 %   Lymphs Abs 1.0 0.7 - 4.0 K/uL   Monocytes Relative 4 %   Monocytes Absolute 0.3 0.1 - 1.0 K/uL   Eosinophils Relative 0 %   Eosinophils Absolute 0.0 0.0 - 0.7 K/uL   Basophils Relative 0 %   Basophils Absolute 0.0 0.0 - 0.1 K/uL  D-dimer, quantitative     Status: Abnormal   Collection Time: 01/13/15  8:55 PM  Result Value Ref Range   D-Dimer, Quant 1.66 (H) 0.00 - 0.50 ug/mL-FEU    Comment: (NOTE) At the manufacturer cut-off of 0.50 ug/mL FEU, this assay has been documented to exclude PE with a sensitivity and  negative predictive value of 97 to 99%.  At this time, this assay has not been approved by the FDA to exclude DVT/VTE. Results should be correlated with clinical presentation.   Troponin I     Status: None   Collection Time: 01/13/15  8:55 PM  Result Value Ref Range   Troponin I <0.03 <0.031 ng/mL    Comment:        NO INDICATION OF MYOCARDIAL INJURY.   Brain natriuretic peptide     Status: Abnormal   Collection Time: 01/13/15  8:55 PM  Result Value Ref Range   B Natriuretic Peptide 180.0 (H) 0.0 - 100.0 pg/mL  Urinalysis, Routine w reflex microscopic     Status: None   Collection Time: 01/13/15  9:32 PM  Result Value Ref Range   Color, Urine YELLOW YELLOW   APPearance CLEAR CLEAR   Specific Gravity, Urine 1.015 1.005 - 1.030   pH 7.5 5.0 - 8.0   Glucose, UA NEGATIVE NEGATIVE mg/dL   Hgb urine dipstick NEGATIVE NEGATIVE   Bilirubin Urine NEGATIVE NEGATIVE   Ketones, ur NEGATIVE NEGATIVE mg/dL   Protein, ur NEGATIVE NEGATIVE mg/dL   Nitrite  NEGATIVE NEGATIVE   Leukocytes, UA NEGATIVE NEGATIVE    Comment: MICROSCOPIC NOT DONE ON URINES WITH NEGATIVE PROTEIN, BLOOD, LEUKOCYTES, NITRITE, OR GLUCOSE <1000 mg/dL.  Urine culture     Status: None (Preliminary result)   Collection Time: 01/13/15  9:32 PM  Result Value Ref Range   Specimen Description URINE, CLEAN CATCH    Special Requests NONE    Culture      NO GROWTH < 24 HOURS Performed at [facility]    Report Status PENDING   Influenza panel by PCR (type A & B, H1N1)     Status: None   Collection Time: 01/13/15 11:52 PM  Result Value Ref Range   Influenza A By PCR NEGATIVE NEGATIVE   Influenza B By PCR NEGATIVE NEGATIVE   H1N1 flu by pcr NOT DETECTED NOT DETECTED    Comment:        The Xpert Flu assay (FDA approved for nasal aspirates or washes and nasopharyngeal swab specimens), is intended as an aid in the diagnosis of influenza and should not be used as a sole basis for treatment.   Basic metabolic panel     Status: Abnormal   Collection Time: 01/14/15  6:24 AM  Result Value Ref Range   Sodium 144 135 - 145 mmol/L   Potassium 4.0 3.5 - 5.1 mmol/L   Chloride 103 101 - 111 mmol/L   CO2 32 22 - 32 mmol/L   Glucose, Bld 139 (H) 65 - 99 mg/dL   BUN 24 (H) 6 - 20 mg/dL   Creatinine, Ser 0.74 0.44 - 1.00 mg/dL   Calcium 9.5 8.9 - 10.3 mg/dL   GFR calc non Af Amer >60 >60 mL/min   GFR calc Af Amer >60 >60 mL/min    Comment: (NOTE) The eGFR has been calculated using the CKD EPI equation. This calculation has not been validated in all clinical situations. eGFR's persistently <60 mL/min signify possible Chronic Kidney Disease.    Anion gap 9 5 - 15  CBC     Status: Abnormal   Collection Time: 01/14/15  6:24 AM  Result Value Ref Range   WBC 8.1 4.0 - 10.5 K/uL   RBC 4.35 3.87 - 5.11 MIL/uL   Hemoglobin 14.0 12.0 - 15.0 g/dL   HCT 43.4 36.0 - 46.0 %  MCV 99.8 78.0 - 100.0 fL   MCH 32.2 26.0 - 34.0 pg   MCHC 32.3 30.0 - 36.0 g/dL   RDW 13.0 11.5 -  15.5 %   Platelets 138 (L) 150 - 400 K/uL    ABGS No results for input(s): PHART, PO2ART, TCO2, HCO3 in the last 72 hours.  Invalid input(s): PCO2 CULTURES Recent Results (from the past 240 hour(s))  Urine culture     Status: None (Preliminary result)   Collection Time: 01/13/15  9:32 PM  Result Value Ref Range Status   Specimen Description URINE, CLEAN CATCH  Final   Special Requests NONE  Final   Culture   Final    NO GROWTH < 24 HOURS Performed at [facility]    Report Status PENDING  Incomplete   Studies/Results: Dg Chest 2 View  01/13/2015  CLINICAL DATA:  Acute onset of cough. Generalized weakness and shortness of breath. Initial encounter. EXAM: CHEST  2 VIEW COMPARISON:  Chest radiograph performed 01/09/2015 FINDINGS: The lungs are hyperexpanded, with flattening of the hemidiaphragms, compatible with COPD. Mild peribronchial thickening is noted. There is no evidence of pleural effusion or pneumothorax. The heart is mildly enlarged. No acute osseous abnormalities are seen. Clips are seen overlying the left axilla. IMPRESSION: Findings of COPD.  Lungs otherwise appear clear.  Mild cardiomegaly. Electronically Signed   By: Garald Balding M.D.   On: 01/13/2015 22:07   Nm Pulmonary Perf And Vent  01/14/2015  CLINICAL DATA:  82 year old female with acute shortness of breath and cough with elevated D-dimer. EXAM: NUCLEAR MEDICINE VENTILATION - PERFUSION LUNG SCAN TECHNIQUE: Ventilation images were obtained in multiple projections using inhaled aerosol Tc-88mDTPA. Perfusion images were obtained in multiple projections after intravenous injection of Tc-987mAA. RADIOPHARMACEUTICALS:  3363.8illicuries [id number]-88CTPA aerosol inhalation and 4.2 millicurie [id number]-10XAA IV COMPARISON:  01/13/2015 chest radiograph FINDINGS: Ventilation: Patchy areas of decreased ventilation noted. Perfusion: No wedge shaped peripheral perfusion defects noted. Minimal nonsegmental patchy areas of  decreased perfusion match ventilation. IMPRESSION: Low probability for pulmonary embolus (less than 20%). Electronically Signed   By: JeMargarette Canada.D.   On: 01/14/2015 12:02    Medications:  Prior to Admission:  Prescriptions prior to admission  Medication Sig Dispense Refill Last Dose  . albuterol (PROVENTIL HFA;VENTOLIN HFA) 108 (90 BASE) MCG/ACT inhaler Inhale 2 puffs into the lungs 2 (two) times daily as needed. Shortness of Breath   01/13/2015  . atenolol (TENORMIN) 25 MG tablet Take 12.5 mg by mouth every morning.    01/13/2015 at 0830  . Diphenhydramine-PE-APAP (MUCINEX FAST-MAX COLD FLU NGHT) 12.5-5-325 MG/10ML LIQD Take 20 mLs by mouth every 6 (six) hours as needed (Cold Symptoms).   01/13/2015  . escitalopram (LEXAPRO) 5 MG tablet Take 5 mg by mouth daily.     01/13/2015  . levofloxacin (LEVAQUIN) 500 MG tablet Take 500 mg by mouth daily.   01/13/2015  . LORazepam (ATIVAN) 0.5 MG tablet Take 0.5 mg by mouth at bedtime.    01/12/2015  . nitroGLYCERIN (NITROSTAT) 0.4 MG SL tablet Place 0.4 mg under the tongue every 5 (five) minutes as needed for chest pain.   Past Week  . pantoprazole (PROTONIX) 40 MG tablet Take 40 mg by mouth at bedtime.    01/12/2015  . predniSONE (STERAPRED UNI-PAK 21 TAB) 10 MG (21) TBPK tablet Take 6 tabs by mouth daily  for 2 days, then 5 tabs for 2 days, then 4 tabs for 2 days, then 3  tabs for 2 days, 2 tabs for 2 days, then 1 tab by mouth daily for 2 days 42 tablet 0 01/13/2015  . simvastatin (ZOCOR) 80 MG tablet Take 1 tablet by mouth at bedtime.    01/12/2015  . sulfamethoxazole-trimethoprim (BACTRIM DS,SEPTRA DS) 800-160 MG tablet Take 1 tablet by mouth 2 (two) times daily.   01/09/2015 at Unknown time  . traMADol (ULTRAM) 50 MG tablet Take 1 tablet by mouth daily as needed for moderate pain.    More Than A Month   Scheduled: . atenolol  12.5 mg Oral q morning - 10a  . escitalopram  5 mg Oral Daily  . feeding supplement (ENSURE ENLIVE)  237 mL Oral BID BM  .  guaiFENesin  600 mg Oral BID  . ipratropium-albuterol  3 mL Nebulization TID  . [START ON 01/16/2015] levofloxacin  500 mg Oral Q48H  . LORazepam  0.5 mg Oral QHS  . methylPREDNISolone (SOLU-MEDROL) injection  80 mg Intravenous Q12H  . pantoprazole  40 mg Oral QHS  . sodium chloride  3 mL Intravenous Q12H  . sodium chloride  3 mL Intravenous Q12H   Continuous:  MCN:OBSJGG chloride, albuterol, LORazepam, senna, sodium chloride  Assesment: She was admitted with COPD exacerbation and chronic hypoxic respiratory failure with acute exacerbation. She has had elevated d-dimer but had negative ventilation perfusion lung scan. She is significantly improved this morning. She's been sick with this for about 3 weeks so I think she needs one more day of IV treatment.  She has anxiety which is much improved. Her weakness is better.  She has coronary artery occlusive disease but no chest pain Principal Problem:   COPD exacerbation (HCC) Active Problems:   Coronary atherosclerosis   Anxiety   Generalized weakness   SOB (shortness of breath)   Chronic respiratory failure with hypoxia (HCC)   D-dimer, elevated    Plan: She will have one more day of IV treatment and discharge tomorrow presuming that she continues to improve    LOS: 1 day   Jyasia Markoff L 01/15/2015, 8:45 AM

## 2015-01-15 NOTE — Progress Notes (Signed)
Nutrition Brief Note  Patient identified on the Malnutrition Screening Tool (MST) Report  Wt Readings from Last 15 Encounters:  01/13/15 144 lb 8 oz (65.545 kg)  01/09/15 146 lb (66.225 kg)  05/28/14 153 lb (69.4 kg)  09/29/13 153 lb 6.4 oz (69.582 kg)  09/15/13 155 lb (70.308 kg)  11/08/11 163 lb 8 oz (74.163 kg)  11/02/11 140 lb (63.504 kg)    Body mass index is 24.05 kg/(m^2). Patient meets criteria for normal range based on current BMI.   Pt says she is eating much better now. She says she started losing her appetite around Christmas and her weight is down a "few" pounds but today her appetite is more like normal. Weight change is not significant for timeframe.   Current diet order is Heart Healthy , patient is consuming approximately 75-100% of meals and Ensure Enlive BID at this time. Labs and medications reviewed.   No additional nutrition interventions warranted at this time.   Colman Cater MS,RD,CSG,LDN Office: [phone] Pager: [phone]

## 2015-01-16 MED ORDER — PREDNISONE 10 MG (21) PO TBPK: ORAL_TABLET | ORAL | Status: DC

## 2015-01-16 MED ORDER — ALBUTEROL SULFATE (2.5 MG/3ML) 0.083% IN NEBU: 2.5000 mg | INHALATION_SOLUTION | RESPIRATORY_TRACT | Status: DC | PRN

## 2015-01-16 NOTE — Progress Notes (Signed)
Subjective: She feels better and wants to go home.  Objective: Vital signs in last 24 hours: Temp:  [98.2 F (36.8 C)-98.8 F (37.1 C)] 98.2 F (36.8 C) (01/04 3614) Pulse Rate:  [73-85] 84 (01/04 0638) Resp:  [20] 20 (01/04 0638) BP: (127-143)/(73-74) 143/74 mmHg (01/04 0638) SpO2:  [87 %-96 %] 91 % (01/04 0720) Weight change:  Last BM Date: 01/15/15  Intake/Output from previous day: 01/03 0701 - 01/04 0700 In: 720 [P.O.:720] Out: 1125 [Urine:1125]  PHYSICAL EXAM General appearance: alert, cooperative and no distress Resp: clear to auscultation bilaterally Cardio: regular rate and rhythm, S1, S2 normal, no murmur, click, rub or gallop GI: soft, non-tender; bowel sounds normal; no masses,  no organomegaly Extremities: extremities normal, atraumatic, no cyanosis or edema  Lab Results:  No results found for this or any previous visit (from the past 48 hour(s)).  ABGS No results for input(s): PHART, PO2ART, TCO2, HCO3 in the last 72 hours.  Invalid input(s): PCO2 CULTURES Recent Results (from the past 240 hour(s))  Urine culture     Status: None   Collection Time: 01/13/15  9:32 PM  Result Value Ref Range Status   Specimen Description URINE, CLEAN CATCH  Final   Special Requests NONE  Final   Culture   Final    NO GROWTH 1 DAY Performed at [facility]    Report Status 01/15/2015 FINAL  Final   Studies/Results: Nm Pulmonary Perf And Vent  01/14/2015  CLINICAL DATA:  82 year old female with acute shortness of breath and cough with elevated D-dimer. EXAM: NUCLEAR MEDICINE VENTILATION - PERFUSION LUNG SCAN TECHNIQUE: Ventilation images were obtained in multiple projections using inhaled aerosol Tc-25mDTPA. Perfusion images were obtained in multiple projections after intravenous injection of Tc-93mAA. RADIOPHARMACEUTICALS:  3343.1illicuries [id number]-50DTPA aerosol inhalation and 4.2 millicurie TeTOIZTIWPYK-99IAA IV COMPARISON:  01/13/2015 chest radiograph  FINDINGS: Ventilation: Patchy areas of decreased ventilation noted. Perfusion: No wedge shaped peripheral perfusion defects noted. Minimal nonsegmental patchy areas of decreased perfusion match ventilation. IMPRESSION: Low probability for pulmonary embolus (less than 20%). Electronically Signed   By: JeMargarette Canada.D.   On: 01/14/2015 12:02    Medications:  Prior to Admission:  Prescriptions prior to admission  Medication Sig Dispense Refill Last Dose  . albuterol (PROVENTIL HFA;VENTOLIN HFA) 108 (90 BASE) MCG/ACT inhaler Inhale 2 puffs into the lungs 2 (two) times daily as needed. Shortness of Breath   01/13/2015  . atenolol (TENORMIN) 25 MG tablet Take 12.5 mg by mouth every morning.    01/13/2015 at 0830  . Diphenhydramine-PE-APAP (MUCINEX FAST-MAX COLD FLU NGHT) 12.5-5-325 MG/10ML LIQD Take 20 mLs by mouth every 6 (six) hours as needed (Cold Symptoms).   01/13/2015  . escitalopram (LEXAPRO) 5 MG tablet Take 5 mg by mouth daily.     01/13/2015  . levofloxacin (LEVAQUIN) 500 MG tablet Take 500 mg by mouth daily.   01/13/2015  . LORazepam (ATIVAN) 0.5 MG tablet Take 0.5 mg by mouth at bedtime.    01/12/2015  . nitroGLYCERIN (NITROSTAT) 0.4 MG SL tablet Place 0.4 mg under the tongue every 5 (five) minutes as needed for chest pain.   Past Week  . pantoprazole (PROTONIX) 40 MG tablet Take 40 mg by mouth at bedtime.    01/12/2015  . simvastatin (ZOCOR) 80 MG tablet Take 1 tablet by mouth at bedtime.    01/12/2015  . [DISCONTINUED] predniSONE (STERAPRED UNI-PAK 21 TAB) 10 MG (21) TBPK tablet Take 6 tabs by mouth daily  for 2 days, then 5 tabs for 2 days, then 4 tabs for 2 days, then 3 tabs for 2 days, 2 tabs for 2 days, then 1 tab by mouth daily for 2 days 42 tablet 0 01/13/2015  . sulfamethoxazole-trimethoprim (BACTRIM DS,SEPTRA DS) 800-160 MG tablet Take 1 tablet by mouth 2 (two) times daily.   01/09/2015 at Unknown time  . traMADol (ULTRAM) 50 MG tablet Take 1 tablet by mouth daily as needed for moderate pain.     More Than A Month   Scheduled: . atenolol  12.5 mg Oral q morning - 10a  . escitalopram  5 mg Oral Daily  . feeding supplement (ENSURE ENLIVE)  237 mL Oral BID BM  . guaiFENesin  600 mg Oral BID  . ipratropium-albuterol  3 mL Nebulization TID  . levofloxacin  500 mg Oral Q48H  . LORazepam  0.5 mg Oral QHS  . methylPREDNISolone (SOLU-MEDROL) injection  80 mg Intravenous Q12H  . pantoprazole  40 mg Oral QHS  . sodium chloride  3 mL Intravenous Q12H  . sodium chloride  3 mL Intravenous Q12H   Continuous:  LKG:MWNUUV chloride, albuterol, LORazepam, senna, sodium chloride  Assesment: She was admitted with COPD exacerbation and acute on chronic hypoxic respiratory failure. She is much improved and ready for discharge Principal Problem:   COPD exacerbation ([place]) Active Problems:   Coronary atherosclerosis   Anxiety   Generalized weakness   SOB (shortness of breath)   Chronic respiratory failure with hypoxia (HCC)   D-dimer, elevated    Plan: Discharge home today. Please see discharge summary for details    LOS: 2 days   Delayza Lungren L 01/16/2015, 8:40 AM

## 2015-01-16 NOTE — Progress Notes (Signed)
SATURATION QUALIFICATIONS: (This note is used to comply with regulatory documentation for home oxygen)  Patient Saturations on Room Air at Rest = 90%  Patient Saturations on Room Air while Ambulating = 86%  Patient Saturations on 2 Liters of oxygen at rest = 93%  Please briefly explain why patient needs home oxygen: pt destats with exertion.

## 2015-01-16 NOTE — Care Management Important Message (Signed)
Important Message  Patient Details  Name: [name] MRN: [id number] Date of Birth: 1925/06/14   Medicare Important Message Given:  Yes    Sherald Barge, RN 01/16/2015, 9:39 AM

## 2015-01-16 NOTE — Care Management Note (Signed)
Case Management Note  Patient Details  Name: [name] MRN: [id number] Date of Birth: [birth year]  Expected Discharge Date:     01/16/2015             Expected Discharge Plan:  Home/Self Care  In-House Referral:  NA  Discharge planning Services  CM Consult  Post Acute Care Choice:  NA Choice offered to:  NA  DME Arranged:    DME Agency:     HH Arranged:    [place] Agency:     Status of Service:  Completed, signed off  Medicare Important Message Given:  Yes Date Medicare IM Given:    Medicare IM give by:    Date Additional Medicare IM Given:    Additional Medicare Important Message give by:     If discussed at [place] of Stay Meetings, dates discussed:    Additional Comments: Pt discharging home today with self care. Pt has home O2 with port O2 tanks through [facility]. Pt wil need port tank to get home with. Romualdo Bolk, of [facility], made aware and will deliver port tank to pt's room prior to DC.   Sherald Barge, RN 01/16/2015, 9:39 AM

## 2015-01-16 NOTE — Discharge Summary (Signed)
Physician Discharge Summary  Patient ID: [name] MRN: [id number] DOB/AGE: 80/21/1927 80 y.o. Primary Care Physician:Areatha Kalata L, MD Admit date: 01/13/2015 Discharge date: 01/16/2015    Discharge Diagnoses:   Principal Problem:   COPD exacerbation ([place]) Active Problems:   Coronary atherosclerosis   Anxiety   Generalized weakness   SOB (shortness of breath)   Chronic respiratory failure with hypoxia (HCC)   D-dimer, elevated     Medication List    STOP taking these medications        sulfamethoxazole-trimethoprim 800-160 MG tablet  Commonly known as:  BACTRIM DS,SEPTRA DS      TAKE these medications        albuterol 108 (90 Base) MCG/ACT inhaler  Commonly known as:  PROVENTIL HFA;VENTOLIN HFA  Inhale 2 puffs into the lungs 2 (two) times daily as needed. Shortness of Breath     albuterol (2.5 MG/3ML) 0.083% nebulizer solution  Commonly known as:  PROVENTIL  Take 3 mLs (2.5 mg total) by nebulization every 4 (four) hours as needed for wheezing.     atenolol 25 MG tablet  Commonly known as:  TENORMIN  Take 12.5 mg by mouth every morning.     escitalopram 5 MG tablet  Commonly known as:  LEXAPRO  Take 5 mg by mouth daily.     levofloxacin 500 MG tablet  Commonly known as:  LEVAQUIN  Take 500 mg by mouth daily.     LORazepam 0.5 MG tablet  Commonly known as:  ATIVAN  Take 0.5 mg by mouth at bedtime.     MUCINEX FAST-MAX COLD FLU NGHT 12.5-5-325 MG/10ML Liqd  Generic drug:  Diphenhydramine-PE-APAP  Take 20 mLs by mouth every 6 (six) hours as needed (Cold Symptoms).     nitroGLYCERIN 0.4 MG SL tablet  Commonly known as:  NITROSTAT  Place 0.4 mg under the tongue every 5 (five) minutes as needed for chest pain.     pantoprazole 40 MG tablet  Commonly known as:  PROTONIX  Take 40 mg by mouth at bedtime.     predniSONE 10 MG (21) Tbpk tablet  Commonly known as:  STERAPRED UNI-PAK 21 TAB  4 daily for 3,3 daily for 3, 2 daily for 3 then 1 daily for 3 and  stop     simvastatin 80 MG tablet  Commonly known as:  ZOCOR  Take 1 tablet by mouth at bedtime.     traMADol 50 MG tablet  Commonly known as:  ULTRAM  Take 1 tablet by mouth daily as needed for moderate pain.        Discharged Condition: Improved    Consults: None  Significant Diagnostic Studies: Dg Chest 2 View  01/13/2015  CLINICAL DATA:  Acute onset of cough. Generalized weakness and shortness of breath. Initial encounter. EXAM: CHEST  2 VIEW COMPARISON:  Chest radiograph performed 01/09/2015 FINDINGS: The lungs are hyperexpanded, with flattening of the hemidiaphragms, compatible with COPD. Mild peribronchial thickening is noted. There is no evidence of pleural effusion or pneumothorax. The heart is mildly enlarged. No acute osseous abnormalities are seen. Clips are seen overlying the left axilla. IMPRESSION: Findings of COPD.  Lungs otherwise appear clear.  Mild cardiomegaly. Electronically Signed   By: Garald Balding M.D.   On: 01/13/2015 22:07   Dg Chest 2 View  01/09/2015  CLINICAL DATA:  Cough and congestion since Saturday, coughing up stuff this morning, shortness of breath, history hypertension, coronary artery disease, COPD, breast cancer, former smoker EXAM: CHEST  2  VIEW COMPARISON:  09/28/2014 FINDINGS: Enlargement of cardiac silhouette. Calcified tortuous aorta. Mediastinal contours and pulmonary vascularity normal. Emphysematous changes consistent with COPD. Chronic interstitial prominence and biapical scarring stable. No definite acute infiltrate, pleural effusion, or pneumothorax. Bones demineralized. IMPRESSION: Enlargement of cardiac silhouette. Changes of COPD and chronic interstitial disease without definite acute infiltrate. Electronically Signed   By: Lavonia Dana M.D.   On: 01/09/2015 13:53   Nm Pulmonary Perf And Vent  01/14/2015  CLINICAL DATA:  82 year old female with acute shortness of breath and cough with elevated D-dimer. EXAM: NUCLEAR MEDICINE VENTILATION -  PERFUSION LUNG SCAN TECHNIQUE: Ventilation images were obtained in multiple projections using inhaled aerosol Tc-52mDTPA. Perfusion images were obtained in multiple projections after intravenous injection of Tc-977mAA. RADIOPHARMACEUTICALS:  3309.3illicuries TeATFTDDUKGU-54YTPA aerosol inhalation and 4.2 millicurie TeHCWCBJSEGB-15VAA IV COMPARISON:  01/13/2015 chest radiograph FINDINGS: Ventilation: Patchy areas of decreased ventilation noted. Perfusion: No wedge shaped peripheral perfusion defects noted. Minimal nonsegmental patchy areas of decreased perfusion match ventilation. IMPRESSION: Low probability for pulmonary embolus (less than 20%). Electronically Signed   By: JeMargarette Canada.D.   On: 01/14/2015 12:02    Lab Results: Basic Metabolic Panel:  Recent Labs  01/13/15 2055 01/14/15 0624  NA 142 144  K 4.3 4.0  CL 103 103  CO2 33* 32  GLUCOSE 134* 139*  BUN 23* 24*  CREATININE 0.61 0.74  CALCIUM 9.7 9.5   Liver Function Tests:  Recent Labs  01/13/15 2055  AST 27  ALT 41  ALKPHOS 55  BILITOT 0.6  PROT 6.5  ALBUMIN 3.6     CBC:  Recent Labs  01/13/15 2055 01/14/15 0624  WBC 7.4 8.1  NEUTROABS 6.2  --   HGB 14.2 14.0  HCT 43.9 43.4  MCV 99.1 99.8  PLT 142* 138*    Recent Results (from the past 240 hour(s))  Urine culture     Status: None   Collection Time: 01/13/15  9:32 PM  Result Value Ref Range Status   Specimen Description URINE, CLEAN CATCH  Final   Special Requests NONE  Final   Culture   Final    NO GROWTH 1 DAY Performed at [facility]  Report Status 01/15/2015 FINAL  Final     Hospital Course: This is an 89ear old who came to the emergency department because of increasing cough and congestion. She was found to have COPD exacerbation and hypoxia. She had been being treated for COPD exacerbation for the last several days as an outpatient. She was started on IV antibiotics and steroids and oxygen and inhaled bronchodilators and improved  rapidly. At the time of discharge she was back at baseline.  Discharge Exam: Blood pressure 143/74, pulse 84, temperature 98.2 F (36.8 C), temperature source Oral, resp. rate 20, height '5\' 5"'$  (1.651 m), weight 65.545 kg (144 lb 8 oz), SpO2 91 %. She is awake and alert. She looks comfortable. Her chest is clear.  Disposition: Home she does not want home health services. She has oxygen and a nebulizer at home.      Discharge Instructions    Discharge patient    Complete by:  As directed            Follow-up Information    Follow up with Decklan Mau L, MD. Schedule an appointment as soon as possible for a visit in 1 week.   Specialty:  Pulmonary Disease   Why:  follow up   Contact information:  [address] [place] [address] [phone]       Signed: Liev Brockbank L   01/16/2015, 8:42 AM

## 2015-01-16 NOTE — Progress Notes (Signed)
Discharged PT per MD order and protocol. Reviewed discharge teaching and handouts given. Pt verbalized understanding and left with all belongings. Oxygen set up and at [facility]. Pt left with Oxygen. Explained prescriptions and handed it to patient.  VSS. IV catheter D/C.  Patient wheeled down by staff member. Oswald Hillock, RN

## 2015-01-24 DIAGNOSIS — I25119 Atherosclerotic heart disease of native coronary artery with unspecified angina pectoris: Secondary | ICD-10-CM | POA: Diagnosis not present

## 2015-01-24 DIAGNOSIS — J449 Chronic obstructive pulmonary disease, unspecified: Secondary | ICD-10-CM | POA: Diagnosis not present

## 2015-01-24 DIAGNOSIS — R252 Cramp and spasm: Secondary | ICD-10-CM | POA: Diagnosis not present

## 2015-01-24 DIAGNOSIS — J9611 Chronic respiratory failure with hypoxia: Secondary | ICD-10-CM | POA: Diagnosis not present

## 2015-02-09 DIAGNOSIS — J449 Chronic obstructive pulmonary disease, unspecified: Secondary | ICD-10-CM | POA: Diagnosis not present

## 2015-02-11 DIAGNOSIS — R609 Edema, unspecified: Secondary | ICD-10-CM | POA: Diagnosis not present

## 2015-02-11 DIAGNOSIS — I25119 Atherosclerotic heart disease of native coronary artery with unspecified angina pectoris: Secondary | ICD-10-CM | POA: Diagnosis not present

## 2015-02-11 DIAGNOSIS — J449 Chronic obstructive pulmonary disease, unspecified: Secondary | ICD-10-CM | POA: Diagnosis not present

## 2015-02-20 DIAGNOSIS — J449 Chronic obstructive pulmonary disease, unspecified: Secondary | ICD-10-CM | POA: Diagnosis not present

## 2015-02-20 DIAGNOSIS — I10 Essential (primary) hypertension: Secondary | ICD-10-CM | POA: Diagnosis not present

## 2015-02-20 DIAGNOSIS — I25119 Atherosclerotic heart disease of native coronary artery with unspecified angina pectoris: Secondary | ICD-10-CM | POA: Diagnosis not present

## 2015-02-20 DIAGNOSIS — M25511 Pain in right shoulder: Secondary | ICD-10-CM | POA: Diagnosis not present

## 2015-02-20 DIAGNOSIS — J9611 Chronic respiratory failure with hypoxia: Secondary | ICD-10-CM | POA: Diagnosis not present

## 2015-03-12 DIAGNOSIS — J449 Chronic obstructive pulmonary disease, unspecified: Secondary | ICD-10-CM | POA: Diagnosis not present

## 2015-04-02 ENCOUNTER — Ambulatory Visit: Payer: Medicare Other | Admitting: Cardiology

## 2015-04-09 DIAGNOSIS — J449 Chronic obstructive pulmonary disease, unspecified: Secondary | ICD-10-CM | POA: Diagnosis not present

## 2015-05-10 DIAGNOSIS — J449 Chronic obstructive pulmonary disease, unspecified: Secondary | ICD-10-CM | POA: Diagnosis not present

## 2015-05-21 DIAGNOSIS — J449 Chronic obstructive pulmonary disease, unspecified: Secondary | ICD-10-CM | POA: Diagnosis not present

## 2015-05-21 DIAGNOSIS — I2510 Atherosclerotic heart disease of native coronary artery without angina pectoris: Secondary | ICD-10-CM | POA: Diagnosis not present

## 2015-05-21 DIAGNOSIS — J9611 Chronic respiratory failure with hypoxia: Secondary | ICD-10-CM | POA: Diagnosis not present

## 2015-05-21 DIAGNOSIS — I10 Essential (primary) hypertension: Secondary | ICD-10-CM | POA: Diagnosis not present

## 2015-06-09 DIAGNOSIS — J449 Chronic obstructive pulmonary disease, unspecified: Secondary | ICD-10-CM | POA: Diagnosis not present

## 2015-07-10 DIAGNOSIS — J449 Chronic obstructive pulmonary disease, unspecified: Secondary | ICD-10-CM | POA: Diagnosis not present

## 2015-08-09 DIAGNOSIS — J449 Chronic obstructive pulmonary disease, unspecified: Secondary | ICD-10-CM | POA: Diagnosis not present

## 2015-08-21 DIAGNOSIS — I25119 Atherosclerotic heart disease of native coronary artery with unspecified angina pectoris: Secondary | ICD-10-CM | POA: Diagnosis not present

## 2015-08-21 DIAGNOSIS — J449 Chronic obstructive pulmonary disease, unspecified: Secondary | ICD-10-CM | POA: Diagnosis not present

## 2015-08-21 DIAGNOSIS — J9611 Chronic respiratory failure with hypoxia: Secondary | ICD-10-CM | POA: Diagnosis not present

## 2015-08-21 DIAGNOSIS — I10 Essential (primary) hypertension: Secondary | ICD-10-CM | POA: Diagnosis not present

## 2015-08-21 DIAGNOSIS — M25551 Pain in right hip: Secondary | ICD-10-CM | POA: Diagnosis not present

## 2015-09-09 DIAGNOSIS — J449 Chronic obstructive pulmonary disease, unspecified: Secondary | ICD-10-CM | POA: Diagnosis not present

## 2015-10-10 DIAGNOSIS — J449 Chronic obstructive pulmonary disease, unspecified: Secondary | ICD-10-CM | POA: Diagnosis not present

## 2015-11-11 ENCOUNTER — Emergency Department (HOSPITAL_COMMUNITY): Payer: Medicare Other

## 2015-11-11 ENCOUNTER — Encounter (HOSPITAL_COMMUNITY): Payer: Self-pay | Admitting: *Deleted

## 2015-11-11 ENCOUNTER — Inpatient Hospital Stay (HOSPITAL_COMMUNITY)
Admission: EM | Admit: 2015-11-11 | Discharge: 2015-11-15 | DRG: 871 | Disposition: A | Discharge status: Home / Self Care | Payer: Medicare Other | Attending: Pulmonary Disease | Admitting: Pulmonary Disease

## 2015-11-11 DIAGNOSIS — Z79899 Other long term (current) drug therapy: Secondary | ICD-10-CM

## 2015-11-11 DIAGNOSIS — Z888 Allergy status to other drugs, medicaments and biological substances status: Secondary | ICD-10-CM

## 2015-11-11 DIAGNOSIS — F419 Anxiety disorder, unspecified: Secondary | ICD-10-CM | POA: Diagnosis present

## 2015-11-11 DIAGNOSIS — Z87891 Personal history of nicotine dependence: Secondary | ICD-10-CM | POA: Diagnosis not present

## 2015-11-11 DIAGNOSIS — J449 Chronic obstructive pulmonary disease, unspecified: Secondary | ICD-10-CM

## 2015-11-11 DIAGNOSIS — I712 Thoracic aortic aneurysm, without rupture: Secondary | ICD-10-CM | POA: Diagnosis present

## 2015-11-11 DIAGNOSIS — R1012 Left upper quadrant pain: Secondary | ICD-10-CM | POA: Diagnosis present

## 2015-11-11 DIAGNOSIS — I509 Heart failure, unspecified: Secondary | ICD-10-CM | POA: Diagnosis present

## 2015-11-11 DIAGNOSIS — R112 Nausea with vomiting, unspecified: Secondary | ICD-10-CM | POA: Diagnosis present

## 2015-11-11 DIAGNOSIS — R7989 Other specified abnormal findings of blood chemistry: Secondary | ICD-10-CM | POA: Diagnosis not present

## 2015-11-11 DIAGNOSIS — I2583 Coronary atherosclerosis due to lipid rich plaque: Secondary | ICD-10-CM

## 2015-11-11 DIAGNOSIS — Z853 Personal history of malignant neoplasm of breast: Secondary | ICD-10-CM

## 2015-11-11 DIAGNOSIS — Z9049 Acquired absence of other specified parts of digestive tract: Secondary | ICD-10-CM

## 2015-11-11 DIAGNOSIS — R109 Unspecified abdominal pain: Secondary | ICD-10-CM | POA: Diagnosis present

## 2015-11-11 DIAGNOSIS — E7800 Pure hypercholesterolemia, unspecified: Secondary | ICD-10-CM | POA: Diagnosis present

## 2015-11-11 DIAGNOSIS — I2510 Atherosclerotic heart disease of native coronary artery without angina pectoris: Secondary | ICD-10-CM | POA: Diagnosis present

## 2015-11-11 DIAGNOSIS — K8031 Calculus of bile duct with cholangitis, unspecified, with obstruction: Secondary | ICD-10-CM | POA: Diagnosis present

## 2015-11-11 DIAGNOSIS — R7881 Bacteremia: Secondary | ICD-10-CM | POA: Diagnosis not present

## 2015-11-11 DIAGNOSIS — F329 Major depressive disorder, single episode, unspecified: Secondary | ICD-10-CM | POA: Diagnosis present

## 2015-11-11 DIAGNOSIS — E871 Hypo-osmolality and hyponatremia: Secondary | ICD-10-CM | POA: Diagnosis present

## 2015-11-11 DIAGNOSIS — I10 Essential (primary) hypertension: Secondary | ICD-10-CM | POA: Diagnosis present

## 2015-11-11 DIAGNOSIS — F32A Depression, unspecified: Secondary | ICD-10-CM | POA: Diagnosis present

## 2015-11-11 DIAGNOSIS — K859 Acute pancreatitis without necrosis or infection, unspecified: Secondary | ICD-10-CM | POA: Diagnosis present

## 2015-11-11 DIAGNOSIS — J9611 Chronic respiratory failure with hypoxia: Secondary | ICD-10-CM | POA: Diagnosis present

## 2015-11-11 DIAGNOSIS — K219 Gastro-esophageal reflux disease without esophagitis: Secondary | ICD-10-CM | POA: Diagnosis present

## 2015-11-11 DIAGNOSIS — Z9981 Dependence on supplemental oxygen: Secondary | ICD-10-CM | POA: Diagnosis not present

## 2015-11-11 DIAGNOSIS — D696 Thrombocytopenia, unspecified: Secondary | ICD-10-CM | POA: Diagnosis present

## 2015-11-11 DIAGNOSIS — K851 Biliary acute pancreatitis without necrosis or infection: Secondary | ICD-10-CM | POA: Diagnosis present

## 2015-11-11 DIAGNOSIS — R945 Abnormal results of liver function studies: Secondary | ICD-10-CM | POA: Diagnosis present

## 2015-11-11 DIAGNOSIS — K5900 Constipation, unspecified: Secondary | ICD-10-CM | POA: Diagnosis present

## 2015-11-11 DIAGNOSIS — Z955 Presence of coronary angioplasty implant and graft: Secondary | ICD-10-CM | POA: Diagnosis not present

## 2015-11-11 DIAGNOSIS — R17 Unspecified jaundice: Secondary | ICD-10-CM

## 2015-11-11 DIAGNOSIS — I110 Hypertensive heart disease with heart failure: Secondary | ICD-10-CM | POA: Diagnosis present

## 2015-11-11 DIAGNOSIS — A419 Sepsis, unspecified organism: Secondary | ICD-10-CM | POA: Diagnosis present

## 2015-11-11 DIAGNOSIS — Z79891 Long term (current) use of opiate analgesic: Secondary | ICD-10-CM

## 2015-11-11 DIAGNOSIS — E878 Other disorders of electrolyte and fluid balance, not elsewhere classified: Secondary | ICD-10-CM | POA: Diagnosis present

## 2015-11-11 DIAGNOSIS — A4189 Other specified sepsis: Principal | ICD-10-CM | POA: Diagnosis present

## 2015-11-11 DIAGNOSIS — R918 Other nonspecific abnormal finding of lung field: Secondary | ICD-10-CM

## 2015-11-11 DIAGNOSIS — R1013 Epigastric pain: Secondary | ICD-10-CM | POA: Diagnosis not present

## 2015-11-11 DIAGNOSIS — I272 Pulmonary hypertension, unspecified: Secondary | ICD-10-CM | POA: Diagnosis present

## 2015-11-11 DIAGNOSIS — R748 Abnormal levels of other serum enzymes: Secondary | ICD-10-CM | POA: Diagnosis present

## 2015-11-11 DIAGNOSIS — B961 Klebsiella pneumoniae [K. pneumoniae] as the cause of diseases classified elsewhere: Secondary | ICD-10-CM | POA: Diagnosis present

## 2015-11-11 DIAGNOSIS — K8035 Calculus of bile duct with chronic cholangitis with obstruction: Secondary | ICD-10-CM | POA: Diagnosis not present

## 2015-11-11 DIAGNOSIS — K8050 Calculus of bile duct without cholangitis or cholecystitis without obstruction: Secondary | ICD-10-CM | POA: Diagnosis present

## 2015-11-11 HISTORY — DX: Other specified postprocedural states: Z98.890

## 2015-11-11 HISTORY — DX: Other specified postprocedural states: R11.2

## 2015-11-11 LAB — CBC WITH DIFFERENTIAL/PLATELET
Basophils Absolute: 0 10*3/uL (ref 0.0–0.1)
Basophils Relative: 0 %
Eosinophils Absolute: 0 10*3/uL (ref 0.0–0.7)
Eosinophils Relative: 0 %
HEMATOCRIT: 41.1 % (ref 36.0–46.0)
HEMOGLOBIN: 13.6 g/dL (ref 12.0–15.0)
LYMPHS ABS: 0.9 10*3/uL (ref 0.7–4.0)
Lymphocytes Relative: 6 %
MCH: 32.5 pg (ref 26.0–34.0)
MCHC: 33.1 g/dL (ref 30.0–36.0)
MCV: 98.3 fL (ref 78.0–100.0)
MONO ABS: 1.2 10*3/uL — AB (ref 0.1–1.0)
MONOS PCT: 8 %
NEUTROS ABS: 12.2 10*3/uL — AB (ref 1.7–7.7)
NEUTROS PCT: 86 %
Platelets: 149 10*3/uL — ABNORMAL LOW (ref 150–400)
RBC: 4.18 MIL/uL (ref 3.87–5.11)
RDW: 13.1 % (ref 11.5–15.5)
WBC: 14.3 10*3/uL — ABNORMAL HIGH (ref 4.0–10.5)

## 2015-11-11 LAB — TROPONIN I: Troponin I: <0.03 ng/mL

## 2015-11-11 LAB — COMPREHENSIVE METABOLIC PANEL
ALK PHOS: 181 U/L — AB (ref 38–126)
ALT: 152 U/L — AB (ref 14–54)
AST: 93 U/L — AB (ref 15–41)
Albumin: 3.4 g/dL — ABNORMAL LOW (ref 3.5–5.0)
Anion gap: 11 (ref 5–15)
BUN: 14 mg/dL (ref 6–20)
CALCIUM: 9.5 mg/dL (ref 8.9–10.3)
CHLORIDE: 98 mmol/L — AB (ref 101–111)
CO2: 25 mmol/L (ref 22–32)
Glucose, Bld: 145 mg/dL — ABNORMAL HIGH (ref 65–99)
Potassium: 3.6 mmol/L (ref 3.5–5.1)
Sodium: 134 mmol/L — ABNORMAL LOW (ref 135–145)
Total Bilirubin: 10.2 mg/dL — ABNORMAL HIGH (ref 0.3–1.2)
Total Protein: 7.2 g/dL (ref 6.5–8.1)

## 2015-11-11 LAB — URINE MICROSCOPIC-ADD ON

## 2015-11-11 LAB — URINALYSIS, ROUTINE W REFLEX MICROSCOPIC
GLUCOSE, UA: NEGATIVE mg/dL
Ketones, ur: 40 mg/dL — AB
Leukocytes, UA: NEGATIVE
Nitrite: NEGATIVE
PH: 7 (ref 5.0–8.0)
SPECIFIC GRAVITY, URINE: 1.01 (ref 1.005–1.030)

## 2015-11-11 LAB — LIPASE, BLOOD: Lipase: 351 U/L — ABNORMAL HIGH (ref 11–51)

## 2015-11-11 LAB — LACTIC ACID, PLASMA: Lactic Acid, Venous: 0.9 mmol/L (ref 0.5–1.9)

## 2015-11-11 MED ORDER — ONDANSETRON HCL 4 MG/2ML IJ SOLN
4.0000 mg | Freq: Once | INTRAMUSCULAR | Status: AC
  Administered 2015-11-11: 4 mg via INTRAVENOUS
  Filled 2015-11-11: qty 2

## 2015-11-11 MED ORDER — ONDANSETRON 4 MG PO TBDP
8.0000 mg | ORAL_TABLET | Freq: Once | ORAL | Status: DC
  Filled 2015-11-11: qty 1

## 2015-11-11 MED ORDER — LABETALOL HCL 5 MG/ML IV SOLN
10.0000 mg | INTRAVENOUS | Status: DC | PRN
  Administered 2015-11-12: 10 mg via INTRAVENOUS
  Filled 2015-11-11: qty 4

## 2015-11-11 MED ORDER — PROMETHAZINE HCL 25 MG/ML IJ SOLN: 12.5000 mg | Freq: Four times a day (QID) | INTRAMUSCULAR | Status: DC | PRN

## 2015-11-11 MED ORDER — ALBUTEROL SULFATE (2.5 MG/3ML) 0.083% IN NEBU: 2.5000 mg | INHALATION_SOLUTION | RESPIRATORY_TRACT | Status: DC | PRN

## 2015-11-11 MED ORDER — OXYCODONE HCL 5 MG PO TABS: 5.0000 mg | ORAL_TABLET | ORAL | Status: DC | PRN

## 2015-11-11 MED ORDER — SODIUM CHLORIDE 0.9% FLUSH
3.0000 mL | Freq: Two times a day (BID) | INTRAVENOUS | Status: DC
  Administered 2015-11-12 – 2015-11-14 (×5): 3 mL via INTRAVENOUS

## 2015-11-11 MED ORDER — ATENOLOL 25 MG PO TABS
12.5000 mg | ORAL_TABLET | Freq: Every morning | ORAL | Status: DC
  Administered 2015-11-12 – 2015-11-15 (×4): 12.5 mg via ORAL
  Filled 2015-11-11 (×4): qty 1

## 2015-11-11 MED ORDER — LORAZEPAM 2 MG/ML IJ SOLN
0.5000 mg | Freq: Once | INTRAMUSCULAR | Status: AC
  Administered 2015-11-11: 0.5 mg via INTRAVENOUS
  Filled 2015-11-11: qty 1

## 2015-11-11 MED ORDER — LORAZEPAM 0.5 MG PO TABS
0.5000 mg | ORAL_TABLET | Freq: Every day | ORAL | Status: DC
  Administered 2015-11-12 – 2015-11-14 (×4): 0.5 mg via ORAL
  Filled 2015-11-11 (×4): qty 1

## 2015-11-11 MED ORDER — SODIUM CHLORIDE 0.9 % IV SOLN
250.0000 mL | INTRAVENOUS | Status: DC | PRN
Start: 2015-11-11 — End: 2015-11-15

## 2015-11-11 MED ORDER — SODIUM CHLORIDE 0.9 % IV BOLUS (SEPSIS)
1000.0000 mL | Freq: Once | INTRAVENOUS | Status: AC
  Administered 2015-11-11: 1000 mL via INTRAVENOUS

## 2015-11-11 MED ORDER — ACETAMINOPHEN 650 MG RE SUPP: 650.0000 mg | Freq: Four times a day (QID) | RECTAL | Status: DC | PRN

## 2015-11-11 MED ORDER — ENOXAPARIN SODIUM 40 MG/0.4ML ~~LOC~~ SOLN
40.0000 mg | SUBCUTANEOUS | Status: AC
  Administered 2015-11-12 – 2015-11-13 (×2): 40 mg via SUBCUTANEOUS
  Filled 2015-11-11 (×2): qty 0.4

## 2015-11-11 MED ORDER — ONDANSETRON HCL 4 MG PO TABS
4.0000 mg | ORAL_TABLET | Freq: Four times a day (QID) | ORAL | Status: DC | PRN
  Administered 2015-11-13: 4 mg via ORAL
  Filled 2015-11-11: qty 1

## 2015-11-11 MED ORDER — CITALOPRAM HYDROBROMIDE 20 MG PO TABS
20.0000 mg | ORAL_TABLET | Freq: Every day | ORAL | Status: DC
  Administered 2015-11-12 – 2015-11-15 (×3): 20 mg via ORAL
  Filled 2015-11-11 (×3): qty 1

## 2015-11-11 MED ORDER — ONDANSETRON HCL 4 MG/2ML IJ SOLN
4.0000 mg | Freq: Four times a day (QID) | INTRAMUSCULAR | Status: DC | PRN
  Administered 2015-11-12: 4 mg via INTRAVENOUS
  Filled 2015-11-11: qty 2

## 2015-11-11 MED ORDER — SODIUM CHLORIDE 0.9% FLUSH
3.0000 mL | INTRAVENOUS | Status: DC | PRN
  Administered 2015-11-12: 3 mL via INTRAVENOUS
  Filled 2015-11-11: qty 3

## 2015-11-11 MED ORDER — MORPHINE SULFATE (PF) 2 MG/ML IV SOLN: 2.0000 mg | Freq: Once | INTRAVENOUS | Status: DC

## 2015-11-11 MED ORDER — ACETAMINOPHEN 325 MG PO TABS: 650.0000 mg | ORAL_TABLET | Freq: Four times a day (QID) | ORAL | Status: DC | PRN

## 2015-11-11 MED ORDER — TIOTROPIUM BROMIDE MONOHYDRATE 18 MCG IN CAPS
18.0000 ug | ORAL_CAPSULE | Freq: Every day | RESPIRATORY_TRACT | Status: DC
  Administered 2015-11-12 – 2015-11-15 (×4): 18 ug via RESPIRATORY_TRACT
  Filled 2015-11-11: qty 5

## 2015-11-11 NOTE — ED Triage Notes (Signed)
Pt brought in by [facility] for c/o n/v x 2 days; pt c/o bilateral upper quad pain

## 2015-11-11 NOTE — ED Notes (Signed)
Patient's oxygen is low, states that she has COPD and uses 2 liters of oxygen at night.  Patient placed on 2 liters of oxygen via nasal cannula.

## 2015-11-11 NOTE — H&P (Signed)
History and Physical    [name] BJY:[id number] DOB: [birth year] DOA: 11/11/2015  PCP: Alonza Bogus, MD   Patient coming from: Home  Chief Complaint: Abdominal pain, nausea, vomiting  HPI: [name] is a 82 y.o. female with medical history significant for COPD with chronic hypoxic respiratory failure, coronary artery disease with stents, depression, anxiety, and hypertension who presents to the emergency department with 2 days of abdominal pain with nausea and nonbloody vomiting. Patient reports that she had been in her usual state of health until the rather acute onset of pain in the right upper abdomen described as severe, waxing and waning, worse with oral intake, sharp in character, with no alleviating factors identified, and with associated nausea and nonbloody vomiting. Patient denies any diarrhea and reports having a normal bowel movement on the day of her presentation. She reports history of remote cholecystectomy. She denies any alcohol use. She reports chills the past 2 nights but has not taken her temperature. She denies recent long distance travel or sick contacts. She denies chest pain, palpitations, cough, or dyspnea. She denies lower extremity edema or orthopnea.    ED Course: Upon arrival to the ED, patient is found to be afebrile, saturating adequately on 2 L/m supplemental oxygen, hypertensive to 170/100, and vitals otherwise stable. EKG demonstrates sinus rhythm with early transition and chest x-ray is notable for vascular congestion and cardiomegaly with increased interstitial markings concerning for mild pulmonary edema. Also noted on the chest x-ray incidentally is a new right hilar prominence concerning for possible mass. Chemistry panel features a mild hyponatremia and hypochloremia, as well as total bilirubin of 10.2 and moderate elevations in alkaline phosphatase and transaminases. Lipase is also elevated to a value of 351. CBC features a leukocytosis to 14,300 and a  mild thrombocytopenia with platelet count of 149,000. Urinalysis is unremarkable. Urine was sent for culture, patient was given 1 L of normal saline, and she was treated symptomatically with Ativan and Zofran. Patient has remained hemodynamically stable in the ED and will be admitted to the medical surgical unit for ongoing evaluation and management of abdominal pain with nausea and vomiting with marked jaundice of uncertain etiology.  Review of Systems:  All other systems reviewed and apart from HPI, are negative.  Past Medical History:  Diagnosis Date  . Aneurysm ([place])    thoracic aorta  . Anxiety   . Breast cancer ([place]) 2003   treated with surgery and Arimadex  . COPD (chronic obstructive pulmonary disease) ([place])   . Coronary artery disease    2 stents per patient  . Depression   . Dilated bile duct    noted on CT 2009, ampulla looked normal on EGD  . Hypercholesteremia   . Hypertension   . Ischemic colitis ([place]) 2009    Past Surgical History:  Procedure Laterality Date  . ABDOMINAL HYSTERECTOMY    . APPENDECTOMY    . BREAST SURGERY     left partial mastectomy 04/15/11, wide excision left breast  04/21/11, left partial mastectomy 12/19/02  . CHOLECYSTECTOMY    . COLONOSCOPY  05/2007   Dr. Perfecto Kingdom, ulcerated, edematous area at 35-40cm from anal verge bx c/w ischemic colitis, frequent sigm tics, small internal hemrrhoids. SMA/IMA wide open on CT at that time.   . ESOPHAGOGASTRODUODENOSCOPY  05/2007   Dr. Malon Kindle ring, 2-3 cm HH, streaky erythema antrum with superficial erosions, bx gastritis without H.Pylori  . HERNIA REPAIR       reports that she quit  smoking about 10 months ago. She has never used smokeless tobacco. She reports that she does not drink alcohol or use drugs.  Allergies  Allergen Reactions  . Ceftin [Cefuroxime Axetil] Anaphylaxis  . Iohexol Hives and Shortness Of Breath    Pt states she got hives and was sob after last CT; pt  refused IV CT contrast    Family History  Problem Relation Age of Onset  . Breast cancer Daughter   . Breast cancer Mother   . Breast cancer Sister   . Colon cancer Neg Hx      Prior to Admission medications   Medication Sig Start Date End Date Taking? Authorizing Provider  albuterol (PROVENTIL HFA;VENTOLIN HFA) 108 (90 BASE) MCG/ACT inhaler Inhale 2 puffs into the lungs 2 (two) times daily as needed. Shortness of Breath   Yes Historical Provider, MD  albuterol (PROVENTIL) (2.5 MG/3ML) 0.083% nebulizer solution Take 3 mLs (2.5 mg total) by nebulization every 4 (four) hours as needed for wheezing. 01/16/15  Yes Sinda Du, MD  atenolol (TENORMIN) 25 MG tablet Take 12.5 mg by mouth every morning.    Yes Historical Provider, MD  escitalopram (LEXAPRO) 5 MG tablet Take 5 mg by mouth daily.     Yes Historical Provider, MD  LORazepam (ATIVAN) 0.5 MG tablet Take 0.5 mg by mouth at bedtime.    Yes Historical Provider, MD  nitroGLYCERIN (NITROSTAT) 0.4 MG SL tablet Place 0.4 mg under the tongue every 5 (five) minutes as needed for chest pain.   Yes Historical Provider, MD  pantoprazole (PROTONIX) 40 MG tablet Take 40 mg by mouth daily.    Yes Historical Provider, MD  simvastatin (ZOCOR) 80 MG tablet Take 1 tablet by mouth at bedtime.  12/14/14  Yes Historical Provider, MD  tiotropium (SPIRIVA) 18 MCG inhalation capsule Place 18 mcg into inhaler and inhale daily.   Yes Historical Provider, MD  traMADol (ULTRAM) 50 MG tablet Take 1 tablet by mouth daily as needed for moderate pain.  10/31/14  Yes Historical Provider, MD    Physical Exam: Vitals:   11/11/15 2200 11/11/15 2230 11/11/15 2300 11/11/15 2330  BP: 168/86 175/85 174/90 173/94  Pulse: 77 80 82 85  Resp: (!) 30 26 (!) 27 24  Temp:      TempSrc:      SpO2: (!) 85% 98% 98% 96%  Weight:      Height:          Constitutional: NAD, calm, appears uncomfortable, jaundice Eyes: PERTLA, lids and conjunctivae normal ENMT: Mucous  membranes are dry. Posterior pharynx clear of any exudate or lesions.   Neck: normal, supple, no masses, no thyromegaly Respiratory: clear to auscultation bilaterally, scattered wheezes, no crackles. Normal respiratory effort.  Cardiovascular: S1 & S2 heard, regular rate and rhythm. No extremity edema. No significant JVD. Abdomen: No distension, tender in RUQ and epigastrium without rebound pain or guarding, no masses palpated. Bowel sounds normal.  Musculoskeletal: no clubbing / cyanosis. No joint deformity upper and lower extremities. Normal muscle tone.  Skin: no significant rashes, lesions, ulcers. Warm, dry, well-perfused. Jaundice.  Neurologic: CN 2-12 grossly intact. Sensation intact, DTR normal. Strength 5/5 in all 4 limbs.  Psychiatric: Normal judgment and insight. Alert and oriented x 3. Normal mood and affect.     Labs on Admission: I have personally reviewed following labs and imaging studies  CBC:  Recent Labs Lab 11/11/15 2147  WBC 14.3*  NEUTROABS 12.2*  HGB 13.6  HCT 41.1  MCV 98.3  PLT 630*   Basic Metabolic Panel:  Recent Labs Lab 11/11/15 2147  NA 134*  K 3.6  CL 98*  CO2 25  GLUCOSE 145*  BUN 14  CREATININE <0.30*  CALCIUM 9.5   GFR: CrCl cannot be calculated (This lab value cannot be used to calculate CrCl because it is not a number: <0.30). Liver Function Tests:  Recent Labs Lab 11/11/15 2147  AST 93*  ALT 152*  ALKPHOS 181*  BILITOT 10.2*  PROT 7.2  ALBUMIN 3.4*    Recent Labs Lab 11/11/15 2147  LIPASE 351*   No results for input(s): AMMONIA in the last 168 hours. Coagulation Profile: No results for input(s): INR, PROTIME in the last 168 hours. Cardiac Enzymes:  Recent Labs Lab 11/11/15 2147  TROPONINI <0.03   BNP (last 3 results) No results for input(s): PROBNP in the last 8760 hours. HbA1C: No results for input(s): HGBA1C in the last 72 hours. CBG: No results for input(s): GLUCAP in the last 168 hours. Lipid  Profile: No results for input(s): CHOL, HDL, LDLCALC, TRIG, CHOLHDL, LDLDIRECT in the last 72 hours. Thyroid Function Tests: No results for input(s): TSH, T4TOTAL, FREET4, T3FREE, THYROIDAB in the last 72 hours. Anemia Panel: No results for input(s): VITAMINB12, FOLATE, FERRITIN, TIBC, IRON, RETICCTPCT in the last 72 hours. Urine analysis:    Component Value Date/Time   COLORURINE YELLOW 11/11/2015 2115   APPEARANCEUR CLEAR 11/11/2015 2115   LABSPEC 1.010 11/11/2015 2115   PHURINE 7.0 11/11/2015 2115   GLUCOSEU NEGATIVE 11/11/2015 2115   HGBUR SMALL (A) 11/11/2015 2115   BILIRUBINUR LARGE (A) 11/11/2015 2115   KETONESUR 40 (A) 11/11/2015 2115   PROTEINUR TRACE (A) 11/11/2015 2115   UROBILINOGEN 1.0 09/15/2013 1935   NITRITE NEGATIVE 11/11/2015 2115   LEUKOCYTESUR NEGATIVE 11/11/2015 2115   Sepsis Labs: '@LABRCNTIP'$ (procalcitonin:4,lacticidven:4) )No results found for this or any previous visit (from the past 240 hour(s)).   Radiological Exams on Admission: Dg Chest 2 View  Result Date: 11/11/2015 CLINICAL DATA:  Acute onset of generalized weakness and shortness of breath. Nausea and vomiting. Initial encounter. EXAM: CHEST  2 VIEW COMPARISON:  Chest radiograph from 01/13/2015 FINDINGS: The lungs are well-aerated. Vascular congestion is noted. Increased interstitial markings raise concern for mild pulmonary edema. No pneumothorax is seen. Scarring is seen at the lung apices. There is new prominence of the right hilum, raising concern for underlying mass. The heart is enlarged. No acute osseous abnormalities are seen. Clips are noted overlying the left chest wall. IMPRESSION: 1. Vascular congestion and cardiomegaly. Increased interstitial markings raise concern for mild pulmonary edema. 2. New prominence of the right hilum, raising concern for underlying mass. CT of the chest would be helpful for further evaluation, when and as deemed clinically appropriate. 3. Scarring at the lung apices.  Electronically Signed   By: Garald Balding M.D.   On: 11/11/2015 22:41    EKG: Independently reviewed. Sinus rhythm, early R-transition  Assessment/Plan  1. Pancreatitis - Pt presents with 2 days epigastric and RUQ pain with N/V, found to have elevated lipase - Pt is s/p remote cholecystectomy, denies alcohol use, will check triglycerides; obstructing mass a concern   - Further evaluate with abdominal US  - She was given 1 L NS in ED, will hold off on additional IVF for now as CXR already features mild edema, vitals stable, and lactate low - Control pain and nausea with prn agents   2. Hyperbilirubinemia  - Total bilirubin is 10.2 on admission, had been  normal in January 2017   - Abdominal US is pending  - Lab asked to fractionate  - Hold Zocor   3. Leukocytosis  - WBC 14,300 on admission without fever; lactate is low  - UA and CXR not suggestive on infection; pt is s/p remote cholecystectomy  - Blood and urine cultures incubating  - Likely reactive in setting of pancreatitis, N/V   4. COPD with chronic hypoxic respiratory failure  - Pt uses 2 Lpm supplemental O2 at baseline; there is scattered wheeze on exam and nebs ordered   - Continue Spiriva, prn nebs    5. CAD  - No anginal complaints, EKG non-ischemic, and troponin wnl  - Zocor held in light of jaundice, will continue beta-blocker as tolerated   6. Hypertension  - Elevated at time of admission, with pain and N/V likely contributing  - Continue atenolol qD as tolerated  - Control pain and nausea with prn agents  - Use labetalol IVPs prn   7. Hilar mass, right  - New prominence of right hilum noted incidentally on CXR  - Discussed findings with patient - Chest CT has been ordered to further characterize    8. CHF  - There is cardiomegaly and mild edema on CXR - She does not carry diagnosis of CHF and had unremarkable TTE in 2013  - Plan to SLIV for now, follow daily wts and I/O's  - Evaluate further with updated  TTE as deemed appropriate    9. Depression, anxiety  - Stable, continue Lexapro and prn Ativan    DVT prophylaxis: sq Lovenox  Code Status: Full  Family Communication: Discussed with patient Disposition Plan: Admit to med-surg Consults called: None Admission status: Inpatient    Vianne Bulls, MD [facility] Pager [phone]  If 7PM-7AM, please contact night-coverage [email or web address] Password [email or web address]  11/11/2015, 11:57 PM

## 2015-11-11 NOTE — ED Provider Notes (Signed)
[place] DEPT Provider Note   CSN: [id number] Arrival date & time: 11/11/15  2104     History   Chief Complaint Chief Complaint  Patient presents with  . Abdominal Pain    HPI [name] is a 82 y.o. female who presents with abdominal pain with N/V for the past 2 days. PMH significant for COPD on 2L O2 at night, CAD s/p stents, thoracic aneurysm, HTN, HLD, hx of breast cancer, anxiety/depression. She is s/p cholecystectomy, appendectomy, hysterectomy. She states that she is "sick". She lives at home alone. Denies sick contacts. She reports her pain is across her upper abdomen. It is constant. She cannot characterize ("It's pain"). She reports fever, chills, sweats, and decreased PO intake due to inability to keep anything down, nausea with non-bloody vomiting and dry heaves. Denies constipation, diarrhea, hematochezia/melena, or irritative voiding symptoms. Denies headache, chest pain, cough, SOB. Denies alcohol use. Of note she was admitted in 09/2013 for similar symptoms however did not have it worked up with GI because she was tired of testing.  HPI  Past Medical History:  Diagnosis Date  . Aneurysm ([place])    thoracic aorta  . Anxiety   . Breast cancer ([place]) 2003   treated with surgery and Arimadex  . COPD (chronic obstructive pulmonary disease) ([place])   . Coronary artery disease    2 stents per patient  . Depression   . Dilated bile duct    noted on CT 2009, ampulla looked normal on EGD  . Hypercholesteremia   . Hypertension   . Ischemic colitis [facility]) 2009    Patient Active Problem List   Diagnosis Date Noted  . Generalized weakness 01/13/2015  . Chronic respiratory failure with hypoxia ([place]) 01/13/2015  . D-dimer, elevated 01/13/2015  . Abnormal LFTs 09/29/2013  . Clostridium difficile colitis 11/06/2011  . COPD (chronic obstructive pulmonary disease) ([place]) 11/06/2011  . Coronary atherosclerosis 11/06/2011  . Anxiety 11/06/2011  . Depression 11/06/2011     Past Surgical History:  Procedure Laterality Date  . ABDOMINAL HYSTERECTOMY    . APPENDECTOMY    . BREAST SURGERY     left partial mastectomy 04/15/11, wide excision left breast  04/21/11, left partial mastectomy 12/19/02  . CHOLECYSTECTOMY    . COLONOSCOPY  05/2007   Dr. Perfecto Kingdom, ulcerated, edematous area at 35-40cm from anal verge bx c/w ischemic colitis, frequent sigm tics, small internal hemrrhoids. SMA/IMA wide open on CT at that time.   . ESOPHAGOGASTRODUODENOSCOPY  05/2007   Dr. Malon Kindle ring, 2-3 cm HH, streaky erythema antrum with superficial erosions, bx gastritis without H.Pylori  . HERNIA REPAIR      OB History    No data available       Home Medications    Prior to Admission medications   Medication Sig Start Date End Date Taking? Authorizing Provider  albuterol (PROVENTIL HFA;VENTOLIN HFA) 108 (90 BASE) MCG/ACT inhaler Inhale 2 puffs into the lungs 2 (two) times daily as needed. Shortness of Breath    Historical Provider, MD  albuterol (PROVENTIL) (2.5 MG/3ML) 0.083% nebulizer solution Take 3 mLs (2.5 mg total) by nebulization every 4 (four) hours as needed for wheezing. 01/16/15   Sinda Du, MD  atenolol (TENORMIN) 25 MG tablet Take 12.5 mg by mouth every morning.     Historical Provider, MD  Diphenhydramine-PE-APAP ([place] FAST-MAX COLD FLU NGHT) 12.5-5-325 MG/10ML LIQD Take 20 mLs by mouth every 6 (six) hours as needed (Cold Symptoms).    Historical Provider, MD  escitalopram (  LEXAPRO) 5 MG tablet Take 5 mg by mouth daily.      Historical Provider, MD  levofloxacin (LEVAQUIN) 500 MG tablet Take 500 mg by mouth daily.    Historical Provider, MD  LORazepam (ATIVAN) 0.5 MG tablet Take 0.5 mg by mouth at bedtime.     Historical Provider, MD  nitroGLYCERIN (NITROSTAT) 0.4 MG SL tablet Place 0.4 mg under the tongue every 5 (five) minutes as needed for chest pain.    Historical Provider, MD  pantoprazole (PROTONIX) 40 MG tablet Take 40 mg by  mouth at bedtime.     Historical Provider, MD  predniSONE (STERAPRED UNI-PAK 21 TAB) 10 MG (21) TBPK tablet 4 daily for 3,3 daily for 3, 2 daily for 3 then 1 daily for 3 and stop 01/16/15   Sinda Du, MD  simvastatin (ZOCOR) 80 MG tablet Take 1 tablet by mouth at bedtime.  12/14/14   Historical Provider, MD  traMADol (ULTRAM) 50 MG tablet Take 1 tablet by mouth daily as needed for moderate pain.  10/31/14   Historical Provider, MD    Family History Family History  Problem Relation Age of Onset  . Breast cancer Daughter   . Breast cancer Mother   . Breast cancer Sister   . Colon cancer Neg Hx     Social History Social History  Substance Use Topics  . Smoking status: Former Smoker    Quit date: 01/05/2015  . Smokeless tobacco: Never Used  . Alcohol use No     Allergies   Ceftin [cefuroxime axetil] and Iohexol   Review of Systems Review of Systems  Constitutional: Positive for appetite change, chills, diaphoresis and fever.  Respiratory: Negative for cough and shortness of breath.   Cardiovascular: Negative for chest pain.  Gastrointestinal: Positive for abdominal pain, nausea and vomiting. Negative for constipation and diarrhea.  Genitourinary: Negative for dysuria.     Physical Exam Updated Vital Signs BP (!) 166/107 (BP Location: Left Arm)   Pulse 87   Temp 98.4 F (36.9 C) (Oral)   Resp 17   Ht '5\' 5"'$  (1.651 m)   Wt 64.4 kg   SpO2 90%   BMI 23.63 kg/m   Physical Exam  Constitutional: She appears well-developed and well-nourished. She appears distressed.  Appears fatigued  HENT:  Head: Normocephalic and atraumatic.  Dry mucous membranes  Eyes: Conjunctivae are normal.  Neck: Neck supple.  Cardiovascular: Normal rate and regular rhythm.  Exam reveals no gallop and no friction rub.   No murmur heard. Pulmonary/Chest: Effort normal and breath sounds normal. No respiratory distress. She has no wheezes. She has no rales. She exhibits no tenderness.   Abdominal: Soft. Bowel sounds are normal. She exhibits no distension and no mass. There is tenderness. There is no rebound and no guarding. No hernia.  Epigastric and LUQ tenderness  Musculoskeletal: She exhibits no edema.  Neurological: She is alert.  Skin: Skin is warm and dry. She is not diaphoretic.  Psychiatric: She has a normal mood and affect.  Nursing note and vitals reviewed.    ED Treatments / Results  Labs (all labs ordered are listed, but only abnormal results are displayed) Labs Reviewed  COMPREHENSIVE METABOLIC PANEL - Abnormal; Notable for the following:       Result Value   Sodium 134 (*)    Chloride 98 (*)    Glucose, Bld 145 (*)    Creatinine, Ser <0.30 (*)    Albumin 3.4 (*)    AST 93 (*)  ALT 152 (*)    Alkaline Phosphatase 181 (*)    Total Bilirubin 10.2 (*)    All other components within normal limits  LIPASE, BLOOD - Abnormal; Notable for the following:    Lipase 351 (*)    All other components within normal limits  CBC WITH DIFFERENTIAL/PLATELET - Abnormal; Notable for the following:    WBC 14.3 (*)    Platelets 149 (*)    Neutro Abs 12.2 (*)    Monocytes Absolute 1.2 (*)    All other components within normal limits  URINALYSIS, ROUTINE W REFLEX MICROSCOPIC (NOT AT [facility]) - Abnormal; Notable for the following:    Hgb urine dipstick SMALL (*)    Bilirubin Urine LARGE (*)    Ketones, ur 40 (*)    Protein, ur TRACE (*)    All other components within normal limits  URINE MICROSCOPIC-ADD ON - Abnormal; Notable for the following:    Squamous Epithelial / LPF 0-5 (*)    Bacteria, UA RARE (*)    All other components within normal limits  BRAIN NATRIURETIC PEPTIDE - Abnormal; Notable for the following:    B Natriuretic Peptide 184.0 (*)    All other components within normal limits  COMPREHENSIVE METABOLIC PANEL - Abnormal; Notable for the following:    Potassium 3.3 (*)    Chloride 100 (*)    Glucose, Bld 158 (*)    Creatinine, Ser <0.30 (*)     AST 93 (*)    ALT 152 (*)    Alkaline Phosphatase 183 (*)    Total Bilirubin 10.9 (*)    All other components within normal limits  CBC - Abnormal; Notable for the following:    WBC 14.7 (*)    Platelets 145 (*)    All other components within normal limits  AMYLASE - Abnormal; Notable for the following:    Amylase 326 (*)    All other components within normal limits  LIPASE, BLOOD - Abnormal; Notable for the following:    Lipase 264 (*)    All other components within normal limits  BILIRUBIN, FRACTIONATED(TOT/DIR/INDIR) - Abnormal; Notable for the following:    Total Bilirubin 10.2 (*)    Bilirubin, Direct 6.9 (*)    Indirect Bilirubin 3.3 (*)    All other components within normal limits  GLUCOSE, CAPILLARY - Abnormal; Notable for the following:    Glucose-Capillary 139 (*)    All other components within normal limits  CULTURE, BLOOD (ROUTINE X 2)  CULTURE, BLOOD (ROUTINE X 2)  URINE CULTURE  TROPONIN I  LACTIC ACID, PLASMA  PROTIME-INR  LACTIC ACID, PLASMA  LACTIC ACID, PLASMA  TRIGLYCERIDES  HEPATITIS PANEL, ACUTE  COMPREHENSIVE METABOLIC PANEL  CBC WITH DIFFERENTIAL/PLATELET  LIPASE, BLOOD    EKG  EKG Interpretation None       Radiology No results found.  Procedures Procedures (including critical care time)  Medications Ordered in ED Medications  ondansetron (ZOFRAN-ODT) disintegrating tablet 8 mg (8 mg Oral Not Given 11/11/15 2144)  tiotropium (SPIRIVA) inhalation capsule 18 mcg (18 mcg Inhalation Given 11/12/15 1435)  albuterol (PROVENTIL) (2.5 MG/3ML) 0.083% nebulizer solution 2.5 mg (not administered)  LORazepam (ATIVAN) tablet 0.5 mg (0.5 mg Oral Given 11/12/15 0208)  atenolol (TENORMIN) tablet 12.5 mg (12.5 mg Oral Given 11/12/15 0915)  citalopram (CELEXA) tablet 20 mg (20 mg Oral Given 11/12/15 0915)  enoxaparin (LOVENOX) injection 40 mg (40 mg Subcutaneous Given 11/12/15 0914)  sodium chloride flush (NS) 0.9 % injection 3 mL (3 mLs  Intravenous  Given 11/12/15 1224)  sodium chloride flush (NS) 0.9 % injection 3 mL (3 mLs Intravenous Given 11/12/15 1000)  0.9 %  sodium chloride infusion (not administered)  acetaminophen (TYLENOL) tablet 650 mg (not administered)    Or  acetaminophen (TYLENOL) suppository 650 mg (not administered)  oxyCODONE (Oxy IR/ROXICODONE) immediate release tablet 5-10 mg (not administered)  ondansetron (ZOFRAN) tablet 4 mg ( Oral See Alternative 11/12/15 0208)    Or  ondansetron (ZOFRAN) injection 4 mg (4 mg Intravenous Given 11/12/15 0208)  labetalol (NORMODYNE,TRANDATE) injection 10 mg (10 mg Intravenous Given 11/12/15 0206)  famotidine (PEPCID) IVPB 20 mg premix (20 mg Intravenous Given 11/12/15 0915)  chlorhexidine (PERIDEX) 0.12 % solution 15 mL (15 mLs Mouth Rinse Given 11/12/15 0915)  MEDLINE mouth rinse (15 mLs Mouth Rinse Not Given 11/12/15 1600)  0.9 %  sodium chloride infusion ( Intravenous Stopped 11/12/15 1511)  ciprofloxacin (CIPRO) IVPB 400 mg (400 mg Intravenous Given 11/12/15 1407)  metroNIDAZOLE (FLAGYL) IVPB 500 mg (500 mg Intravenous Given 11/12/15 1223)  promethazine (PHENERGAN) injection 6.25 mg (not administered)  potassium chloride SA (K-DUR,KLOR-CON) CR tablet 20 mEq (20 mEq Oral Given 11/12/15 1738)  diphenhydrAMINE (BENADRYL) capsule 25 mg (not administered)  ondansetron (ZOFRAN) injection 4 mg (4 mg Intravenous Given 11/11/15 2155)  sodium chloride 0.9 % bolus 1,000 mL (0 mLs Intravenous Stopped 11/11/15 2345)  LORazepam (ATIVAN) injection 0.5 mg (0.5 mg Intravenous Given 11/11/15 2214)  LORazepam (ATIVAN) injection 1 mg (1 mg Intravenous Given 11/12/15 1511)  gadobenate dimeglumine (MULTIHANCE) injection 13 mL (13 mLs Intravenous Contrast Given 11/12/15 1631)     Initial Impression / Assessment and Plan / ED Course  I have reviewed the triage vital signs and the nursing notes.  Pertinent labs & imaging results that were available during my care of the patient were reviewed by  me and considered in my medical decision making (see chart for details).  Clinical Course   82 year old female presents with symptoms and lab findings consistent with pancreatitis. Patient is afebrile, not tachycardic or tachypneic. She is hypertensive and hypoxic - She is dependent on 2L O2 at night. CBC remarkable for leukocytosis of 14.7. CMP remarkable for elevated transaminases and bilirubin of 10.9. Lipase is 351. Lactic acid is normal. UA remarkable for small amount of hemoglobin, large bilirubin, 40 ketones, trace protein. EKG is sinus rhythm. Troponin is 0. CXR remarkable for mild pulmonary edema as well as new mass.   IV fluids, Zofran, Ativan given with symptomatic relief. Patient has a contrast allergy and ultrasound is not currently available at this time. We'll defer testing to hospitalist. Spoke with Dr. Marnette Burgess who will admit patient. Appreciate assistance  Final Clinical Impressions(s) / ED Diagnoses   Final diagnoses:  Elevated LFTs  Left upper quadrant pain  Elevated lipase  Nausea and vomiting, intractability of vomiting not specified, unspecified vomiting type  Jaundice of recent onset  Hilar mass    New Prescriptions New Prescriptions   No medications on file     Recardo Evangelist, PA-C 11/12/15 1749    Virgel Manifold, MD 11/18/15 1217

## 2015-11-12 ENCOUNTER — Inpatient Hospital Stay (HOSPITAL_COMMUNITY): Payer: Medicare Other

## 2015-11-12 ENCOUNTER — Encounter (HOSPITAL_COMMUNITY): Payer: Self-pay | Admitting: Gastroenterology

## 2015-11-12 DIAGNOSIS — R945 Abnormal results of liver function studies: Secondary | ICD-10-CM

## 2015-11-12 DIAGNOSIS — K851 Biliary acute pancreatitis without necrosis or infection: Secondary | ICD-10-CM

## 2015-11-12 DIAGNOSIS — R918 Other nonspecific abnormal finding of lung field: Secondary | ICD-10-CM | POA: Insufficient documentation

## 2015-11-12 DIAGNOSIS — R7989 Other specified abnormal findings of blood chemistry: Secondary | ICD-10-CM

## 2015-11-12 LAB — LACTIC ACID, PLASMA
Lactic Acid, Venous: 1.1 mmol/L (ref 0.5–1.9)
Lactic Acid, Venous: 0.8 mmol/L (ref 0.5–1.9)

## 2015-11-12 LAB — BILIRUBIN, FRACTIONATED(TOT/DIR/INDIR)
BILIRUBIN TOTAL: 10.2 mg/dL — AB (ref 0.3–1.2)
Bilirubin, Direct: 6.9 mg/dL — ABNORMAL HIGH (ref 0.1–0.5)
Indirect Bilirubin: 3.3 mg/dL — ABNORMAL HIGH (ref 0.3–0.9)

## 2015-11-12 LAB — CBC
HCT: 38.5 % (ref 36.0–46.0)
HEMOGLOBIN: 12.7 g/dL (ref 12.0–15.0)
MCH: 32.5 pg (ref 26.0–34.0)
MCHC: 33 g/dL (ref 30.0–36.0)
MCV: 98.5 fL (ref 78.0–100.0)
Platelets: 145 10*3/uL — ABNORMAL LOW (ref 150–400)
RBC: 3.91 MIL/uL (ref 3.87–5.11)
RDW: 13.3 % (ref 11.5–15.5)
WBC: 14.7 10*3/uL — AB (ref 4.0–10.5)

## 2015-11-12 LAB — PROTIME-INR
INR: 1.07
Prothrombin Time: 13.9 seconds (ref 11.4–15.2)

## 2015-11-12 LAB — COMPREHENSIVE METABOLIC PANEL
ALK PHOS: 183 U/L — AB (ref 38–126)
ALT: 152 U/L — ABNORMAL HIGH (ref 14–54)
AST: 93 U/L — ABNORMAL HIGH (ref 15–41)
Albumin: 3.6 g/dL (ref 3.5–5.0)
Anion gap: 10 (ref 5–15)
BILIRUBIN TOTAL: 10.9 mg/dL — AB (ref 0.3–1.2)
BUN: 11 mg/dL (ref 6–20)
CALCIUM: 9.3 mg/dL (ref 8.9–10.3)
CO2: 25 mmol/L (ref 22–32)
Chloride: 100 mmol/L — ABNORMAL LOW (ref 101–111)
Creatinine, Ser: <0.3 mg/dL — ABNORMAL LOW (ref 0.44–1.00)
Glucose, Bld: 158 mg/dL — ABNORMAL HIGH (ref 65–99)
Potassium: 3.3 mmol/L — ABNORMAL LOW (ref 3.5–5.1)
SODIUM: 135 mmol/L (ref 135–145)
TOTAL PROTEIN: 7.3 g/dL (ref 6.5–8.1)

## 2015-11-12 LAB — BRAIN NATRIURETIC PEPTIDE: B Natriuretic Peptide: 184 pg/mL — ABNORMAL HIGH (ref 0.0–100.0)

## 2015-11-12 LAB — AMYLASE: Amylase: 326 U/L — ABNORMAL HIGH (ref 28–100)

## 2015-11-12 LAB — GLUCOSE, CAPILLARY: Glucose-Capillary: 139 mg/dL — ABNORMAL HIGH (ref 65–99)

## 2015-11-12 LAB — TRIGLYCERIDES: TRIGLYCERIDES: 106 mg/dL (ref <150)

## 2015-11-12 LAB — LIPASE, BLOOD: LIPASE: 264 U/L — AB (ref 11–51)

## 2015-11-12 MED ORDER — CIPROFLOXACIN IN D5W 400 MG/200ML IV SOLN
400.0000 mg | Freq: Two times a day (BID) | INTRAVENOUS | Status: DC
  Administered 2015-11-12 – 2015-11-15 (×6): 400 mg via INTRAVENOUS
  Filled 2015-11-12 (×6): qty 200

## 2015-11-12 MED ORDER — GADOBENATE DIMEGLUMINE 529 MG/ML IV SOLN
13.0000 mL | Freq: Once | INTRAVENOUS | Status: AC | PRN
  Administered 2015-11-12: 13 mL via INTRAVENOUS

## 2015-11-12 MED ORDER — SODIUM CHLORIDE 0.9 % IV SOLN
INTRAVENOUS | Status: DC
  Administered 2015-11-13 – 2015-11-14 (×2): via INTRAVENOUS

## 2015-11-12 MED ORDER — FAMOTIDINE IN NACL 20-0.9 MG/50ML-% IV SOLN
20.0000 mg | Freq: Two times a day (BID) | INTRAVENOUS | Status: DC
  Administered 2015-11-12 – 2015-11-14 (×7): 20 mg via INTRAVENOUS
  Filled 2015-11-12 (×6): qty 50

## 2015-11-12 MED ORDER — CHLORHEXIDINE GLUCONATE 0.12 % MT SOLN
15.0000 mL | Freq: Two times a day (BID) | OROMUCOSAL | Status: DC
  Administered 2015-11-12 – 2015-11-15 (×7): 15 mL via OROMUCOSAL
  Filled 2015-11-12 (×6): qty 15

## 2015-11-12 MED ORDER — PROMETHAZINE HCL 25 MG/ML IJ SOLN: 6.2500 mg | Freq: Four times a day (QID) | INTRAMUSCULAR | Status: DC | PRN

## 2015-11-12 MED ORDER — SODIUM CHLORIDE 0.9 % IV SOLN
INTRAVENOUS | Status: DC
  Administered 2015-11-12: 12:00:00 via INTRAVENOUS

## 2015-11-12 MED ORDER — POTASSIUM CHLORIDE CRYS ER 20 MEQ PO TBCR
20.0000 meq | EXTENDED_RELEASE_TABLET | Freq: Every day | ORAL | Status: DC
  Administered 2015-11-12 – 2015-11-15 (×3): 20 meq via ORAL
  Filled 2015-11-12 (×3): qty 1

## 2015-11-12 MED ORDER — METRONIDAZOLE IN NACL 5-0.79 MG/ML-% IV SOLN
500.0000 mg | Freq: Three times a day (TID) | INTRAVENOUS | Status: DC
  Administered 2015-11-12 – 2015-11-15 (×8): 500 mg via INTRAVENOUS
  Filled 2015-11-12 (×7): qty 100

## 2015-11-12 MED ORDER — LORAZEPAM 2 MG/ML IJ SOLN
1.0000 mg | Freq: Once | INTRAMUSCULAR | Status: AC
  Administered 2015-11-12: 1 mg via INTRAVENOUS
  Filled 2015-11-12: qty 1

## 2015-11-12 MED ORDER — ORAL CARE MOUTH RINSE
15.0000 mL | Freq: Two times a day (BID) | OROMUCOSAL | Status: DC
  Administered 2015-11-12 – 2015-11-15 (×5): 15 mL via OROMUCOSAL

## 2015-11-12 MED ORDER — DIPHENHYDRAMINE HCL 25 MG PO CAPS
25.0000 mg | ORAL_CAPSULE | ORAL | Status: DC | PRN
  Administered 2015-11-13: 25 mg via ORAL
  Filled 2015-11-12: qty 1

## 2015-11-12 NOTE — Progress Notes (Signed)
Dr. Luan Pulling notified of positive blood culture results.

## 2015-11-12 NOTE — Progress Notes (Signed)
Subjective: She was admitted last night with abdominal pain that appears to be related to pancreatitis. She had hilar prominence on chest x-ray and that appears to be related to pulmonary hypertension. She also had an aneurysm in her thoracic aorta which has expanded somewhat. I personally reviewed the films. Her bile duct is dilated on sonogram. She still has abdominal pain. She is nauseated. Her shortness of breath is about the same. No chest pain.  Objective: Vital signs in last 24 hours: Temp:  [97.9 F (36.6 C)-98.4 F (36.9 C)] 98.3 F (36.8 C) (10/31 0500) Pulse Rate:  [77-92] 92 (10/31 0500) Resp:  [17-30] 30 (10/31 0000) BP: (106-182)/(51-107) 106/51 (10/31 0500) SpO2:  [83 %-98 %] 97 % (10/31 0500) Weight:  [63.3 kg (139 lb 8.8 oz)-64.4 kg (142 lb)] 63.3 kg (139 lb 8.8 oz) (10/31 0500) Weight change:  Last BM Date: 11/11/15  Intake/Output from previous day: 10/30 0701 - 10/31 0700 In: 50 [IV Piggyback:50] Out: 50 [Urine:50]  PHYSICAL EXAM General appearance: alert, cooperative, mild distress and Anxious Resp: Clear with bilaterally diminished breath sounds Cardio: regular rate and rhythm, S1, S2 normal, no murmur, click, rub or gallop GI: Diffuse abdominal tenderness. Hyperactive bowel sounds Extremities: extremities normal, atraumatic, no cyanosis or edema Mucous membranes are dry. Pupils are reactive. Skin is warm and dry  Lab Results:  Results for orders placed or performed during the hospital encounter of 11/11/15 (from the past 48 hour(s))  Urinalysis, Routine w reflex microscopic     Status: Abnormal   Collection Time: 11/11/15  9:15 PM  Result Value Ref Range   Color, Urine YELLOW YELLOW   APPearance CLEAR CLEAR   Specific Gravity, Urine 1.010 1.005 - 1.030   pH 7.0 5.0 - 8.0   Glucose, UA NEGATIVE NEGATIVE mg/dL   Hgb urine dipstick SMALL (A) NEGATIVE   Bilirubin Urine LARGE (A) NEGATIVE   Ketones, ur 40 (A) NEGATIVE mg/dL   Protein, ur TRACE (A) NEGATIVE  mg/dL   Nitrite NEGATIVE NEGATIVE   Leukocytes, UA NEGATIVE NEGATIVE  Urine microscopic-add on     Status: Abnormal   Collection Time: 11/11/15  9:15 PM  Result Value Ref Range   Squamous Epithelial / LPF 0-5 (A) NONE SEEN   WBC, UA 0-5 0 - 5 WBC/hpf   RBC / HPF 0-5 0 - 5 RBC/hpf   Bacteria, UA RARE (A) NONE SEEN  Comprehensive metabolic panel     Status: Abnormal   Collection Time: 11/11/15  9:47 PM  Result Value Ref Range   Sodium 134 (L) 135 - 145 mmol/L   Potassium 3.6 3.5 - 5.1 mmol/L   Chloride 98 (L) 101 - 111 mmol/L   CO2 25 22 - 32 mmol/L   Glucose, Bld 145 (H) 65 - 99 mg/dL   BUN 14 6 - 20 mg/dL   Creatinine, Ser <0.30 (L) 0.44 - 1.00 mg/dL   Calcium 9.5 8.9 - 10.3 mg/dL   Total Protein 7.2 6.5 - 8.1 g/dL   Albumin 3.4 (L) 3.5 - 5.0 g/dL   AST 93 (H) 15 - 41 U/L   ALT 152 (H) 14 - 54 U/L   Alkaline Phosphatase 181 (H) 38 - 126 U/L   Total Bilirubin 10.2 (H) 0.3 - 1.2 mg/dL   GFR calc non Af Amer NOT CALCULATED >60 mL/min   GFR calc Af Amer NOT CALCULATED >60 mL/min    Comment: (NOTE) The eGFR has been calculated using the CKD EPI equation. This calculation has not  been validated in all clinical situations. eGFR's persistently <60 mL/min signify possible Chronic Kidney Disease.    Anion gap 11 5 - 15  Lipase, blood     Status: Abnormal   Collection Time: 11/11/15  9:47 PM  Result Value Ref Range   Lipase 351 (H) 11 - 51 U/L  Troponin I     Status: None   Collection Time: 11/11/15  9:47 PM  Result Value Ref Range   Troponin I <0.03 <0.03 ng/mL  Lactic acid, plasma     Status: None   Collection Time: 11/11/15  9:47 PM  Result Value Ref Range   Lactic Acid, Venous 0.9 0.5 - 1.9 mmol/L  CBC with Differential     Status: Abnormal   Collection Time: 11/11/15  9:47 PM  Result Value Ref Range   WBC 14.3 (H) 4.0 - 10.5 K/uL   RBC 4.18 3.87 - 5.11 MIL/uL   Hemoglobin 13.6 12.0 - 15.0 g/dL   HCT 41.1 36.0 - 46.0 %   MCV 98.3 78.0 - 100.0 fL   MCH 32.5 26.0 - 34.0  pg   MCHC 33.1 30.0 - 36.0 g/dL   RDW 13.1 11.5 - 15.5 %   Platelets 149 (L) 150 - 400 K/uL   Neutrophils Relative % 86 %   Neutro Abs 12.2 (H) 1.7 - 7.7 K/uL   Lymphocytes Relative 6 %   Lymphs Abs 0.9 0.7 - 4.0 K/uL   Monocytes Relative 8 %   Monocytes Absolute 1.2 (H) 0.1 - 1.0 K/uL   Eosinophils Relative 0 %   Eosinophils Absolute 0.0 0.0 - 0.7 K/uL   Basophils Relative 0 %   Basophils Absolute 0.0 0.0 - 0.1 K/uL  Brain natriuretic peptide     Status: Abnormal   Collection Time: 11/11/15  9:47 PM  Result Value Ref Range   B Natriuretic Peptide 184.0 (H) 0.0 - 100.0 pg/mL  Bilirubin, fractionated(tot/dir/indir)     Status: Abnormal   Collection Time: 11/11/15  9:47 PM  Result Value Ref Range   Total Bilirubin 10.2 (H) 0.3 - 1.2 mg/dL   Bilirubin, Direct 6.9 (H) 0.1 - 0.5 mg/dL   Indirect Bilirubin 3.3 (H) 0.3 - 0.9 mg/dL  Triglycerides     Status: None   Collection Time: 11/11/15  9:47 PM  Result Value Ref Range   Triglycerides 106 <150 mg/dL  Culture, blood (Routine X 2) w Reflex to ID Panel     Status: None (Preliminary result)   Collection Time: 11/12/15 12:30 AM  Result Value Ref Range   Specimen Description BLOOD RIGHT ANTECUBITAL    Special Requests BOTTLES DRAWN AEROBIC AND ANAEROBIC 5CC EACH    Culture NO GROWTH < 12 HOURS    Report Status PENDING   Lactic acid, plasma     Status: None   Collection Time: 11/12/15 12:36 AM  Result Value Ref Range   Lactic Acid, Venous 1.1 0.5 - 1.9 mmol/L  Culture, blood (Routine X 2) w Reflex to ID Panel     Status: None (Preliminary result)   Collection Time: 11/12/15 12:41 AM  Result Value Ref Range   Specimen Description BLOOD RIGHT HAND    Special Requests BOTTLES DRAWN AEROBIC AND ANAEROBIC 4CC EACH    Culture NO GROWTH < 12 HOURS    Report Status PENDING   Comprehensive metabolic panel     Status: Abnormal   Collection Time: 11/12/15  3:00 AM  Result Value Ref Range   Sodium 135 135 - 145  mmol/L   Potassium 3.3 (L)  3.5 - 5.1 mmol/L   Chloride 100 (L) 101 - 111 mmol/L   CO2 25 22 - 32 mmol/L   Glucose, Bld 158 (H) 65 - 99 mg/dL   BUN 11 6 - 20 mg/dL   Creatinine, Ser <0.30 (L) 0.44 - 1.00 mg/dL   Calcium 9.3 8.9 - 10.3 mg/dL   Total Protein 7.3 6.5 - 8.1 g/dL   Albumin 3.6 3.5 - 5.0 g/dL   AST 93 (H) 15 - 41 U/L   ALT 152 (H) 14 - 54 U/L   Alkaline Phosphatase 183 (H) 38 - 126 U/L   Total Bilirubin 10.9 (H) 0.3 - 1.2 mg/dL   GFR calc non Af Amer NOT CALCULATED >60 mL/min   GFR calc Af Amer NOT CALCULATED >60 mL/min    Comment: (NOTE) The eGFR has been calculated using the CKD EPI equation. This calculation has not been validated in all clinical situations. eGFR's persistently <60 mL/min signify possible Chronic Kidney Disease.    Anion gap 10 5 - 15  CBC     Status: Abnormal   Collection Time: 11/12/15  3:00 AM  Result Value Ref Range   WBC 14.7 (H) 4.0 - 10.5 K/uL   RBC 3.91 3.87 - 5.11 MIL/uL   Hemoglobin 12.7 12.0 - 15.0 g/dL   HCT 38.5 36.0 - 46.0 %   MCV 98.5 78.0 - 100.0 fL   MCH 32.5 26.0 - 34.0 pg   MCHC 33.0 30.0 - 36.0 g/dL   RDW 13.3 11.5 - 15.5 %   Platelets 145 (L) 150 - 400 K/uL  Protime-INR     Status: None   Collection Time: 11/12/15  3:00 AM  Result Value Ref Range   Prothrombin Time 13.9 11.4 - 15.2 seconds   INR 1.07   Amylase     Status: Abnormal   Collection Time: 11/12/15  3:00 AM  Result Value Ref Range   Amylase 326 (H) 28 - 100 U/L  Lipase, blood     Status: Abnormal   Collection Time: 11/12/15  3:00 AM  Result Value Ref Range   Lipase 264 (H) 11 - 51 U/L  Lactic acid, plasma     Status: None   Collection Time: 11/12/15  3:00 AM  Result Value Ref Range   Lactic Acid, Venous 0.8 0.5 - 1.9 mmol/L  Glucose, capillary     Status: Abnormal   Collection Time: 11/12/15  7:29 AM  Result Value Ref Range   Glucose-Capillary 139 (H) 65 - 99 mg/dL    ABGS No results for input(s): PHART, PO2ART, TCO2, HCO3 in the last 72 hours.  Invalid input(s):  PCO2 CULTURES Recent Results (from the past 240 hour(s))  Culture, blood (Routine X 2) w Reflex to ID Panel     Status: None (Preliminary result)   Collection Time: 11/12/15 12:30 AM  Result Value Ref Range Status   Specimen Description BLOOD RIGHT ANTECUBITAL  Final   Special Requests BOTTLES DRAWN AEROBIC AND ANAEROBIC 5CC EACH  Final   Culture NO GROWTH < 12 HOURS  Final   Report Status PENDING  Incomplete  Culture, blood (Routine X 2) w Reflex to ID Panel     Status: None (Preliminary result)   Collection Time: 11/12/15 12:41 AM  Result Value Ref Range Status   Specimen Description BLOOD RIGHT HAND  Final   Special Requests BOTTLES DRAWN AEROBIC AND ANAEROBIC 4CC EACH  Final   Culture NO GROWTH < 12  HOURS  Final   Report Status PENDING  Incomplete   Studies/Results: Dg Chest 2 View  Result Date: 11/11/2015 CLINICAL DATA:  Acute onset of generalized weakness and shortness of breath. Nausea and vomiting. Initial encounter. EXAM: CHEST  2 VIEW COMPARISON:  Chest radiograph from 01/13/2015 FINDINGS: The lungs are well-aerated. Vascular congestion is noted. Increased interstitial markings raise concern for mild pulmonary edema. No pneumothorax is seen. Scarring is seen at the lung apices. There is new prominence of the right hilum, raising concern for underlying mass. The heart is enlarged. No acute osseous abnormalities are seen. Clips are noted overlying the left chest wall. IMPRESSION: 1. Vascular congestion and cardiomegaly. Increased interstitial markings raise concern for mild pulmonary edema. 2. New prominence of the right hilum, raising concern for underlying mass. CT of the chest would be helpful for further evaluation, when and as deemed clinically appropriate. 3. Scarring at the lung apices. Electronically Signed   By: Garald Balding M.D.   On: 11/11/2015 22:41   Ct Chest Wo Contrast  Result Date: 11/12/2015 CLINICAL DATA:  82 y/o F; history of breast cancer presenting with  shortness of breath and abnormal chest radiograph. EXAM: CT CHEST WITHOUT CONTRAST TECHNIQUE: Multidetector CT imaging of the chest was performed following the standard protocol without IV contrast. COMPARISON:  11/11/2015 chest radiograph. 09/15/2013 CT abdomen and pelvis FINDINGS: Cardiovascular: Aortic atherosclerosis with moderate calcifications. The descending thoracic aorta near the hilum measures 4.2 cm and there is a stable linear calcification traversing the (series 2, image 101) that previously measured 3.9 cm in 2015. Borderline size of main pulmonary artery. Normal heart size. No pericardial effusion. Moderate to severe coronary artery calcification. Mediastinum/Nodes: No enlarged mediastinal or axillary lymph nodes. Thyroid gland, trachea, and esophagus demonstrate no significant findings. Lungs/Pleura: Moderate predominantly paraseptal emphysema greatest in the mid and upper lung zones. Biapical pleural parenchymal scarring. Pulmonary nodules: Right upper lobe ground-glass 12 mm, series 3: Image 38. Right upper lobe ground-glass 6 mm, 3:57. There are several additional scattered 2-3 mm pulmonary nodules. There are reticular opacities at the lung bases probably representing mild fibrosis. Right basilar platelike atelectasis. No consolidation. Prominence of the right hilum on radiograph corresponds to prominent central pulmonary arteries. Upper Abdomen: Status post cholecystectomy. Nonspecific calcifications right renal hilum. Findings suggestive of intrahepatic biliary ductal dilatation and dilated common bile duct to 20 mm. Left kidney 2 mm stone. Small hiatal hernia. Musculoskeletal: No acute osseous abnormality identified. IMPRESSION: 1. Right hilar prominence that x-ray corresponds to large central pulmonary vessels probably exaggerated by patient's rotation. 2. Descending thoracic aortic aneurysm measuring 4.2 cm, 3.9 cm in 2015 with chronic dissection flap. 3. Moderate predominantly paraseptal  emphysema of the lungs. 4. **An incidental finding of potential clinical significance has been found. Multiple ground-glass nodules measuring up to 12 mm. Non-contrast chest CT at 3-6 months is recommended. If nodules persist, subsequent management will be based upon the most suspicious nodule(s). This recommendation follows the consensus statement: Guidelines for Management of Incidental Pulmonary Nodules Detected on CT Images: From the Fleischner Society 2017; Radiology 2017; 284:228-243.** 5. Dilated common bile duct to 20 mm and findings suggestive of intrahepatic biliary ductal dilatation. Correlation with liver enzymes and possible abdominal imaging is recommended as clinically indicated. Electronically Signed   By: Kristine Garbe M.D.   On: 11/12/2015 01:19    Medications:  Prior to Admission:  Prescriptions Prior to Admission  Medication Sig Dispense Refill Last Dose  . albuterol (PROVENTIL HFA;VENTOLIN HFA) 108 (90 BASE)  MCG/ACT inhaler Inhale 2 puffs into the lungs 2 (two) times daily as needed. Shortness of Breath   unknown  . albuterol (PROVENTIL) (2.5 MG/3ML) 0.083% nebulizer solution Take 3 mLs (2.5 mg total) by nebulization every 4 (four) hours as needed for wheezing. 75 mL 12 unknown  . atenolol (TENORMIN) 25 MG tablet Take 12.5 mg by mouth every morning.    11/11/2015 at [place]  . escitalopram (LEXAPRO) 5 MG tablet Take 5 mg by mouth daily.     11/11/2015 at Unknown time  . LORazepam (ATIVAN) 0.5 MG tablet Take 0.5 mg by mouth at bedtime.    11/10/2015 at Unknown time  . nitroGLYCERIN (NITROSTAT) 0.4 MG SL tablet Place 0.4 mg under the tongue every 5 (five) minutes as needed for chest pain.   unknown  . pantoprazole (PROTONIX) 40 MG tablet Take 40 mg by mouth daily.    11/11/2015 at Unknown time  . simvastatin (ZOCOR) 80 MG tablet Take 1 tablet by mouth at bedtime.    11/10/2015 at Unknown time  . tiotropium (SPIRIVA) 18 MCG inhalation capsule Place 18 mcg into inhaler and  inhale daily.   11/11/2015 at Unknown time  . traMADol (ULTRAM) 50 MG tablet Take 1 tablet by mouth daily as needed for moderate pain.    unknown   Scheduled: . atenolol  12.5 mg Oral q morning - 10a  . chlorhexidine  15 mL Mouth Rinse BID  . citalopram  20 mg Oral Daily  . enoxaparin (LOVENOX) injection  40 mg Subcutaneous Q24H  . famotidine (PEPCID) IV  20 mg Intravenous Q12H  . LORazepam  0.5 mg Oral QHS  . mouth rinse  15 mL Mouth Rinse q12n4p  . ondansetron  8 mg Oral Once  . sodium chloride flush  3 mL Intravenous Q12H  . tiotropium  18 mcg Inhalation Daily   Continuous:  BDZ:HGDJME chloride, acetaminophen **OR** acetaminophen, albuterol, labetalol, ondansetron **OR** ondansetron (ZOFRAN) IV, oxyCODONE, promethazine, sodium chloride flush  Assesment: She was admitted with pancreatitis and has elevated bilirubin level. Her bile duct is dilated. She has abnormal liver function testing. This may be some version of biliary pancreatitis. She says she feels a little better.  There was concern that she had a mass of her right lung but she does not  She has thoracic aortic aneurysm which has grown slightly.  She has COPD which is severe at baseline  She has chronic hypoxic respiratory failure is still on oxygen here Principal Problem:   Pancreatitis Active Problems:   COPD (chronic obstructive pulmonary disease) (HCC)   Coronary atherosclerosis   Anxiety   Depression   Abnormal LFTs   Chronic respiratory failure with hypoxia (HCC)   Mass of right lung   Hypertension   Hyperbilirubinemia   Hyponatremia   Abdominal pain   GERD (gastroesophageal reflux disease)    Plan: Continue treatments with pain medications fluids etc. Request GI consultation.    LOS: 1 day   Danyeal Akens L 11/12/2015, 8:53 AM

## 2015-11-12 NOTE — ED Notes (Signed)
Patient to Ct.

## 2015-11-12 NOTE — ED Notes (Signed)
Patient returned from CT

## 2015-11-12 NOTE — Consult Note (Signed)
Referring Provider: Dr. Luan Pulling  Primary Care Physician:  Alonza Bogus, MD Primary Gastroenterologist:  Dr. Oneida Alar   Date of Admission: 11/11/15 Date of Consultation: 11/12/15  Reason for Consultation:  Pancreatitis, elevated LFTs   HPI:  [name] is an 82 y.o. year old female presenting with worsening abdominal pain, N/V, and noted to have elevated LFTs, lipase 351, and concern for biliary obstruction.   Didn't feel well last week, noted nagging abdominal pain. Noted acute N/V on Saturday, worsening. Notes pain across entire upper abdomen. Associated chills. No fever. Feels much better now that she is in the hospital. Pain, N/V worsened to the point that she presented to the ED yesterday evening. Thought she had gotten a virus. Urine dark. Felt short of breath when she presented to the ED but states now she believes it was related to anxiety. Denies any shortness of breath currently, feels more relaxed. States Spiriva has helped tremendously as an outpatient. Pain has resolved as of this morning. N/V resolved this morning. No unexplained weight loss or lack of appetite.   Elevated LFTs noted with bilirubin 10.2 on admission, direct bilirubin 6.9, indirect 3.3.  elevated transaminases, Alk Phos 180s. US abdomen with CBD 1.2 cm and a few intraluminal echoes observed, no definite evidence of pancreatitis, recommending abdominal MRI/MRCP. Lipase 351 on admission, now 264. History of COPD, CAD, heart failure, with CXR raising concern for mild pulmonary edema.   Appears CBD measured up to 1.5 cm in 2013 on CT. She was seen in 2015 for elevated LFTs and acute onset RUQ pain but denied MRCP at that time. Bilirubin was 2.6, ALT 287, AST 210 during that acute episode.   As separate issue: notes constipation as an outpatient. Wants something to take every day to keep bowels regulated.     Past Medical History:  Diagnosis Date  . 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 Anxiety   . Breast  cancer ([place]) 2003   treated with surgery and Arimadex  . COPD (chronic obstructive pulmonary disease) ([place])   . Coronary artery disease    2 stents per patient  . Depression   . Dilated bile duct    noted on CT 2009, ampulla looked normal on EGD  . Hypercholesteremia   . Hypertension   . Ischemic colitis ([place]) 2009    Past Surgical History:  Procedure Laterality Date  . ABDOMINAL HYSTERECTOMY    . APPENDECTOMY    . BREAST SURGERY     left partial mastectomy 04/15/11, wide excision left breast  04/21/11, left partial mastectomy 12/19/02  . CHOLECYSTECTOMY    . COLONOSCOPY  05/2007   Dr. Perfecto Kingdom, ulcerated, edematous area at 35-40cm from anal verge bx c/w ischemic colitis, frequent sigm tics, small internal hemrrhoids. SMA/IMA wide open on CT at that time.   . CORONARY ANGIOPLASTY WITH STENT PLACEMENT     in remote past, ?2001   . ESOPHAGOGASTRODUODENOSCOPY  05/2007   Dr. Malon Kindle ring, 2-3 cm HH, streaky erythema antrum with superficial erosions, bx gastritis without H.Pylori  . HERNIA REPAIR      Prior to Admission medications   Medication Sig Start Date End Date Taking? Authorizing Provider  albuterol (PROVENTIL HFA;VENTOLIN HFA) 108 (90 BASE) MCG/ACT inhaler Inhale 2 puffs into the lungs 2 (two) times daily as needed. Shortness of Breath   Yes Historical Provider, MD  albuterol (PROVENTIL) (2.5 MG/3ML) 0.083% nebulizer solution Take 3 mLs (2.5 mg total) by nebulization every 4 (four) hours  as needed for wheezing. 01/16/15  Yes Sinda Du, MD  atenolol (TENORMIN) 25 MG tablet Take 12.5 mg by mouth every morning.    Yes Historical Provider, MD  escitalopram (LEXAPRO) 5 MG tablet Take 5 mg by mouth daily.     Yes Historical Provider, MD  LORazepam (ATIVAN) 0.5 MG tablet Take 0.5 mg by mouth at bedtime.    Yes Historical Provider, MD  nitroGLYCERIN (NITROSTAT) 0.4 MG SL tablet Place 0.4 mg under the tongue every 5 (five) minutes as needed for chest pain.    Yes Historical Provider, MD  pantoprazole (PROTONIX) 40 MG tablet Take 40 mg by mouth daily.    Yes Historical Provider, MD  simvastatin (ZOCOR) 80 MG tablet Take 1 tablet by mouth at bedtime.  12/14/14  Yes Historical Provider, MD  tiotropium (SPIRIVA) 18 MCG inhalation capsule Place 18 mcg into inhaler and inhale daily.   Yes Historical Provider, MD  traMADol (ULTRAM) 50 MG tablet Take 1 tablet by mouth daily as needed for moderate pain.  10/31/14  Yes Historical Provider, MD    Current Facility-Administered Medications  Medication Dose Route Frequency Provider Last Rate Last Dose  . 0.9 %  sodium chloride infusion  250 mL Intravenous PRN Vianne Bulls, MD      . acetaminophen (TYLENOL) tablet 650 mg  650 mg Oral Q6H PRN Vianne Bulls, MD       Or  . acetaminophen (TYLENOL) suppository 650 mg  650 mg Rectal Q6H PRN Ilene Qua Opyd, MD      . albuterol (PROVENTIL) (2.5 MG/3ML) 0.083% nebulizer solution 2.5 mg  2.5 mg Nebulization Q4H PRN Ilene Qua Opyd, MD      . atenolol (TENORMIN) tablet 12.5 mg  12.5 mg Oral q morning - 10a Ilene Qua Opyd, MD   12.5 mg at 11/12/15 0915  . chlorhexidine (PERIDEX) 0.12 % solution 15 mL  15 mL Mouth Rinse BID Vianne Bulls, MD   15 mL at 11/12/15 0915  . citalopram (CELEXA) tablet 20 mg  20 mg Oral Daily Vianne Bulls, MD   20 mg at 11/12/15 0915  . enoxaparin (LOVENOX) injection 40 mg  40 mg Subcutaneous Q24H Vianne Bulls, MD   40 mg at 11/12/15 0914  . famotidine (PEPCID) IVPB 20 mg premix  20 mg Intravenous Q12H Ilene Qua Opyd, MD   20 mg at 11/12/15 0915  . labetalol (NORMODYNE,TRANDATE) injection 10 mg  10 mg Intravenous Q2H PRN Vianne Bulls, MD   10 mg at 11/12/15 0206  . LORazepam (ATIVAN) tablet 0.5 mg  0.5 mg Oral QHS Ilene Qua Opyd, MD   0.5 mg at 11/12/15 0208  . MEDLINE mouth rinse  15 mL Mouth Rinse q12n4p Ilene Qua Opyd, MD      . ondansetron (ZOFRAN) tablet 4 mg  4 mg Oral Q6H PRN Vianne Bulls, MD       Or  . ondansetron (ZOFRAN)  injection 4 mg  4 mg Intravenous Q6H PRN Vianne Bulls, MD   4 mg at 11/12/15 0208  . ondansetron (ZOFRAN-ODT) disintegrating tablet 8 mg  8 mg Oral Once Recardo Evangelist, PA-C      . oxyCODONE (Oxy IR/ROXICODONE) immediate release tablet 5-10 mg  5-10 mg Oral Q4H PRN Vianne Bulls, MD      . promethazine (PHENERGAN) injection 12.5 mg  12.5 mg Intravenous Q6H PRN Ilene Qua Opyd, MD      . sodium chloride flush (NS) 0.9 %  injection 3 mL  3 mL Intravenous Q12H Ilene Qua Opyd, MD   3 mL at 11/12/15 0000  . sodium chloride flush (NS) 0.9 % injection 3 mL  3 mL Intravenous PRN Vianne Bulls, MD      . tiotropium (SPIRIVA) inhalation capsule 18 mcg  18 mcg Inhalation Daily Vianne Bulls, MD        Allergies as of 11/11/2015 - Review Complete 11/11/2015  Allergen Reaction Noted  . Ceftin [cefuroxime axetil] Anaphylaxis 11/02/2011  . Iohexol Hives and Shortness Of Breath 11/02/2011    Family History  Problem Relation Age of Onset  . Breast cancer Daughter   . Breast cancer Mother   . Breast cancer Sister   . Colon cancer Neg Hx     Social History   Social History  . Marital status: Widowed    Spouse name: N/A  . Number of children: 3  . Years of education: N/A   Occupational History  . Not on file.   Social History Main Topics  . Smoking status: Former Smoker    Quit date: 01/05/2015  . Smokeless tobacco: Never Used  . Alcohol use No  . Drug use: No  . Sexual activity: Yes    Birth control/ protection: Post-menopausal, Surgical   Other Topics Concern  . Not on file   Social History Narrative  . No narrative on file    Review of Systems: Gen: see HPI  CV: Denies chest pain, heart palpitations, syncope, edema  Resp: see HPI  GI: see HPI  GU : see HPI  MS: Denies joint pain,swelling, cramping Derm: Denies rash, itching, dry skin Psych: +anxiety  Heme: Denies bruising, bleeding, and enlarged lymph nodes.  Physical Exam: Vital signs in last 24 hours: Temp:   [97.9 F (36.6 C)-98.4 F (36.9 C)] 98.3 F (36.8 C) (10/31 0500) Pulse Rate:  [77-92] 92 (10/31 0500) Resp:  [17-30] 30 (10/31 0000) BP: (106-182)/(51-107) 106/51 (10/31 0500) SpO2:  [83 %-98 %] 97 % (10/31 0500) Weight:  [139 lb 8.8 oz (63.3 kg)-142 lb (64.4 kg)] 139 lb 8.8 oz (63.3 kg) (10/31 0500) Last BM Date: 11/11/15 General:   Alert,  Well-developed, jaundiced, appears younger than stated age.  Head:  Normocephalic and atraumatic. Eyes:  Mild scleral icterus  Ears:  Normal auditory acuity. Nose:  No deformity, discharge,  or lesions. Mouth:  No deformity or lesions, dentition normal. Lungs:  Clear throughout to auscultation.   No wheezes, crackles, or rhonchi. No acute distress. Heart:  Regular rate and rhythm; no murmurs, clicks, rubs,  or gallops. Abdomen:  Soft, tender to palpation upper abdomen but moreso in RUQ, no rebound or guarding, no mass or HSM Rectal:  Deferred  Msk:  Symmetrical without gross deformities. Normal posture. Extremities:  Without  edema. Neurologic:  Alert and  oriented x4; Psych:  Alert and cooperative. Normal mood and affect.  Intake/Output from previous day: 10/30 0701 - 10/31 0700 In: 50 [IV Piggyback:50] Out: 61 [Urine:50] Intake/Output this shift: No intake/output data recorded.  Lab Results:  Recent Labs  11/11/15 2147 11/12/15 0300  WBC 14.3* 14.7*  HGB 13.6 12.7  HCT 41.1 38.5  PLT 149* 145*   BMET  Recent Labs  11/11/15 2147 11/12/15 0300  NA 134* 135  K 3.6 3.3*  CL 98* 100*  CO2 25 25  GLUCOSE 145* 158*  BUN 14 11  CREATININE <0.30* <0.30*  CALCIUM 9.5 9.3   LFT  Recent Labs  11/11/15 2147 11/12/15  0300  PROT 7.2 7.3  ALBUMIN 3.4* 3.6  AST 93* 93*  ALT 152* 152*  ALKPHOS 181* 183*  BILITOT 10.2*  10.2* 10.9*  BILIDIR 6.9*  --   IBILI 3.3*  --    PT/INR  Recent Labs  11/12/15 0300  LABPROT 13.9  INR 1.07   Lab Results  Component Value Date   LIPASE 264 (H) 11/12/2015     Studies/Results: Dg Chest 2 View  Result Date: 11/11/2015 CLINICAL DATA:  Acute onset of generalized weakness and shortness of breath. Nausea and vomiting. Initial encounter. EXAM: CHEST  2 VIEW COMPARISON:  Chest radiograph from 01/13/2015 FINDINGS: The lungs are well-aerated. Vascular congestion is noted. Increased interstitial markings raise concern for mild pulmonary edema. No pneumothorax is seen. Scarring is seen at the lung apices. There is new prominence of the right hilum, raising concern for underlying mass. The heart is enlarged. No acute osseous abnormalities are seen. Clips are noted overlying the left chest wall. IMPRESSION: 1. Vascular congestion and cardiomegaly. Increased interstitial markings raise concern for mild pulmonary edema. 2. New prominence of the right hilum, raising concern for underlying mass. CT of the chest would be helpful for further evaluation, when and as deemed clinically appropriate. 3. Scarring at the lung apices. Electronically Signed   By: Garald Balding M.D.   On: 11/11/2015 22:41   Ct Chest Wo Contrast  Result Date: 11/12/2015 CLINICAL DATA:  82 y/o F; history of breast cancer presenting with shortness of breath and abnormal chest radiograph. EXAM: CT CHEST WITHOUT CONTRAST TECHNIQUE: Multidetector CT imaging of the chest was performed following the standard protocol without IV contrast. COMPARISON:  11/11/2015 chest radiograph. 09/15/2013 CT abdomen and pelvis FINDINGS: Cardiovascular: Aortic atherosclerosis with moderate calcifications. The descending thoracic aorta near the hilum measures 4.2 cm and there is a stable linear calcification traversing the (series 2, image 101) that previously measured 3.9 cm in 2015. Borderline size of main pulmonary artery. Normal heart size. No pericardial effusion. Moderate to severe coronary artery calcification. Mediastinum/Nodes: No enlarged mediastinal or axillary lymph nodes. Thyroid gland, trachea, and esophagus  demonstrate no significant findings. Lungs/Pleura: Moderate predominantly paraseptal emphysema greatest in the mid and upper lung zones. Biapical pleural parenchymal scarring. Pulmonary nodules: Right upper lobe ground-glass 12 mm, series 3: Image 38. Right upper lobe ground-glass 6 mm, 3:57. There are several additional scattered 2-3 mm pulmonary nodules. There are reticular opacities at the lung bases probably representing mild fibrosis. Right basilar platelike atelectasis. No consolidation. Prominence of the right hilum on radiograph corresponds to prominent central pulmonary arteries. Upper Abdomen: Status post cholecystectomy. Nonspecific calcifications right renal hilum. Findings suggestive of intrahepatic biliary ductal dilatation and dilated common bile duct to 20 mm. Left kidney 2 mm stone. Small hiatal hernia. Musculoskeletal: No acute osseous abnormality identified. IMPRESSION: 1. Right hilar prominence that x-ray corresponds to large central pulmonary vessels probably exaggerated by patient's rotation. 2. Descending thoracic aortic aneurysm measuring 4.2 cm, 3.9 cm in 2015 with chronic dissection flap. 3. Moderate predominantly paraseptal emphysema of the lungs. 4. **An incidental finding of potential clinical significance has been found. Multiple ground-glass nodules measuring up to 12 mm. Non-contrast chest CT at 3-6 months is recommended. If nodules persist, subsequent management will be based upon the most suspicious nodule(s). This recommendation follows the consensus statement: Guidelines for Management of Incidental Pulmonary Nodules Detected on CT Images: From the Fleischner Society 2017; Radiology 2017; 284:228-243.** 5. Dilated common bile duct to 20 mm and findings suggestive of intrahepatic  biliary ductal dilatation. Correlation with liver enzymes and possible abdominal imaging is recommended as clinically indicated. Electronically Signed   By: Kristine Garbe M.D.   On: 11/12/2015  01:19   US Abdomen Complete  Result Date: 11/12/2015 CLINICAL DATA:  New onset of jaundice and pancreatitis. History of previous cholecystectomy, episodes of pancreatitis, breast malignancy. EXAM: ABDOMEN ULTRASOUND COMPLETE COMPARISON:  Abdominal and pelvic noncontrast CT scan of September 15, 2013 FINDINGS: Gallbladder: The gallbladder is surgically absent. Common bile duct: Diameter: 1.2 cm. A few intraluminal echoes are observed. Liver: The hepatic echotexture is mildly increased diffusely. There is mild intrahepatic ductal dilation which is not entirely new. No discrete hepatic mass is observed. IVC: No abnormality visualized. Pancreas: The pancreatic tail is partially obscured by bowel gas. The common bile duct at the pancreatic head measures 4 mm in diameter. No discrete pancreatic mass is observed. Spleen: Size and appearance within normal limits. Right Kidney: Length: 11.9 cm. Echogenicity within normal limits. No mass or hydronephrosis visualized. Left Kidney: Length: 11.3 cm. There is a lower pole simple appearing cyst measuring 3.4 x 3.5 by 3.2 cm which has increased in size since the previous study. There is no hydronephrosis nor focal solid mass. Abdominal aorta: No aneurysm visualized. Other findings: There is no ascites. IMPRESSION: 1. No definite sonographic evidence of acute pancreatitis. There is mild dilation of the common bile duct and intrahepatic ducts with a few intraluminal echoes in the CBD which may reflect gravel or stones. Abdominal MRI - MRCP would be a useful next imaging steps. 2. Probable fatty infiltrative change of the liver. 3. Simple appearing cyst in the lower pole of the left kidney. Electronically Signed   By: David  Martinique M.D.   On: 11/12/2015 09:20    Impression: 82 year old female admitted with acute abdominal pain, N/V, markedly elevated bilirubin with predominantly direct bilirubin elevated, elevated lipase likely secondary to biliary pancreatitis, and findings  concerning for biliary obstruction on ultrasound but unable to definitively identify stones in CBD. As of note, she had a bump in LFTs and similar CBD diameter several years ago but declined MRCP at that time. With jaundice, abdominal pain, and leukocytosis, will want to cover for evolving cholangitis. Needs MRI/MRCP to further delineate and plan on ERCP as appropriate thereafter. Will need to hold Lovenox day of ERCP. Recommend gentle IV fluids with NS at 75. Appears lipase has improved since admission: as of note, patient reports resolution of abdominal pain, nausea, and vomiting at time of consultation.   Plan: MRI/MRCP today NS at 75 ml/hour Recheck HFP, lipase  in am Will follow-up on pending hepatitis panel  If needed, ERCP would be Thursday. Would need to hold Lovenox dosing on Thursday. May continue clear liquids Discussed antibiotic dosing with pharmacy and due to Ceftin allergy (anaphylaxis), will use Cipro/Flagyl for empiric antibiotics for now   Annitta Needs, ANP-BC [facility]     LOS: 1 day    11/12/2015, 10:24 AM

## 2015-11-13 DIAGNOSIS — K8031 Calculus of bile duct with cholangitis, unspecified, with obstruction: Secondary | ICD-10-CM

## 2015-11-13 LAB — COMPREHENSIVE METABOLIC PANEL
ALT: 101 U/L — AB (ref 14–54)
AST: 57 U/L — AB (ref 15–41)
Albumin: 2.6 g/dL — ABNORMAL LOW (ref 3.5–5.0)
Alkaline Phosphatase: 140 U/L — ABNORMAL HIGH (ref 38–126)
Anion gap: 3 — ABNORMAL LOW (ref 5–15)
BILIRUBIN TOTAL: 2.7 mg/dL — AB (ref 0.3–1.2)
BUN: 12 mg/dL (ref 6–20)
CALCIUM: 9.2 mg/dL (ref 8.9–10.3)
CO2: 31 mmol/L (ref 22–32)
CREATININE: 0.49 mg/dL (ref 0.44–1.00)
Chloride: 106 mmol/L (ref 101–111)
Glucose, Bld: 99 mg/dL (ref 65–99)
Potassium: 4.2 mmol/L (ref 3.5–5.1)
Sodium: 140 mmol/L (ref 135–145)
TOTAL PROTEIN: 5.5 g/dL — AB (ref 6.5–8.1)

## 2015-11-13 LAB — BLOOD CULTURE ID PANEL (REFLEXED)
ACINETOBACTER BAUMANNII: NOT DETECTED
CANDIDA PARAPSILOSIS: NOT DETECTED
CARBAPENEM RESISTANCE: NOT DETECTED
Candida albicans: NOT DETECTED
Candida glabrata: NOT DETECTED
Candida krusei: NOT DETECTED
Candida tropicalis: NOT DETECTED
ENTEROBACTERIACEAE SPECIES: DETECTED — AB
ENTEROCOCCUS SPECIES: NOT DETECTED
Enterobacter cloacae complex: NOT DETECTED
Escherichia coli: NOT DETECTED
HAEMOPHILUS INFLUENZAE: NOT DETECTED
KLEBSIELLA PNEUMONIAE: DETECTED — AB
Klebsiella oxytoca: DETECTED — AB
Listeria monocytogenes: NOT DETECTED
Neisseria meningitidis: NOT DETECTED
PSEUDOMONAS AERUGINOSA: NOT DETECTED
Proteus species: NOT DETECTED
STAPHYLOCOCCUS AUREUS BCID: NOT DETECTED
STAPHYLOCOCCUS SPECIES: NOT DETECTED
STREPTOCOCCUS PNEUMONIAE: NOT DETECTED
Serratia marcescens: NOT DETECTED
Streptococcus agalactiae: NOT DETECTED
Streptococcus pyogenes: NOT DETECTED
Streptococcus species: NOT DETECTED
VANCOMYCIN RESISTANCE: NOT DETECTED

## 2015-11-13 LAB — CBC WITH DIFFERENTIAL/PLATELET
BASOS ABS: 0 10*3/uL (ref 0.0–0.1)
BASOS PCT: 1 %
EOS ABS: 0.2 10*3/uL (ref 0.0–0.7)
EOS PCT: 3 %
HCT: 36.2 % (ref 36.0–46.0)
Hemoglobin: 11.2 g/dL — ABNORMAL LOW (ref 12.0–15.0)
Lymphocytes Relative: 18 %
Lymphs Abs: 1.3 10*3/uL (ref 0.7–4.0)
MCH: 31.6 pg (ref 26.0–34.0)
MCHC: 30.9 g/dL (ref 30.0–36.0)
MCV: 102.3 fL — ABNORMAL HIGH (ref 78.0–100.0)
Monocytes Absolute: 1.1 10*3/uL — ABNORMAL HIGH (ref 0.1–1.0)
Monocytes Relative: 14 %
NEUTROS PCT: 64 %
Neutro Abs: 4.8 10*3/uL (ref 1.7–7.7)
PLATELETS: 142 10*3/uL — AB (ref 150–400)
RBC: 3.54 MIL/uL — AB (ref 3.87–5.11)
RDW: 13.6 % (ref 11.5–15.5)
WBC: 7.4 10*3/uL (ref 4.0–10.5)

## 2015-11-13 LAB — URINE CULTURE: Culture: <10000 — AB

## 2015-11-13 LAB — GLUCOSE, CAPILLARY: Glucose-Capillary: 100 mg/dL — ABNORMAL HIGH (ref 65–99)

## 2015-11-13 LAB — HEPATITIS PANEL, ACUTE
HCV Ab: <0.1 s/co ratio (ref 0.0–0.9)
HEP A IGM: UNDETERMINED
HEP B C IGM: NEGATIVE
HEP B S AG: NEGATIVE

## 2015-11-13 LAB — LIPASE, BLOOD: LIPASE: 24 U/L (ref 11–51)

## 2015-11-13 MED ORDER — PREDNISONE 20 MG PO TABS
50.0000 mg | ORAL_TABLET | Freq: Once | ORAL | Status: AC
  Administered 2015-11-13: 50 mg via ORAL
  Filled 2015-11-13: qty 2

## 2015-11-13 MED ORDER — PREDNISONE 20 MG PO TABS
50.0000 mg | ORAL_TABLET | Freq: Once | ORAL | Status: AC
  Administered 2015-11-14: 50 mg via ORAL
  Filled 2015-11-13: qty 2

## 2015-11-13 MED ORDER — SENNA 8.6 MG PO TABS
1.0000 | ORAL_TABLET | Freq: Once | ORAL | Status: AC
  Administered 2015-11-13: 8.6 mg via ORAL
  Filled 2015-11-13: qty 1

## 2015-11-13 MED ORDER — DIPHENHYDRAMINE HCL 25 MG PO CAPS
50.0000 mg | ORAL_CAPSULE | Freq: Once | ORAL | Status: AC
  Administered 2015-11-14: 50 mg via ORAL
  Filled 2015-11-13: qty 2

## 2015-11-13 NOTE — Progress Notes (Signed)
Lab called positive blood culture results. I called Dr. Anastasio Champion and adv him of results. He asked that I let Dr. Luan Pulling know in the am. She is currently on Cipro and Flagyl. Dr. Anastasio Champion did not give any new orders. Will inform Dr. Luan Pulling in am.

## 2015-11-13 NOTE — Progress Notes (Signed)
Discussed with Dr. Gala Romney. Given patient's IVP dye allergy she will be premedicated with Benadryl 50 mg orally 13, 7, one hour prior to procedure which is scheduled for 1135 tomorrow morning. She will receive Benadryl 50 mg by mouth one hour before procedure tomorrow.  Discussed with patient who is agreeable. Discussed with patient's nurse, Jenny Reichmann. She will pass on information to night nurse.  Discussed with Abbie Sons, she reports that the patient is on schedule for 1135 am tomorrow morning.

## 2015-11-13 NOTE — Progress Notes (Signed)
Subjective:  No complaints. Abdomen sore. No n/v.  Objective: Vital signs in last 24 hours: Temp:  [98.2 F (36.8 C)-98.9 F (37.2 C)] 98.2 F (36.8 C) (11/01 0500) Pulse Rate:  [73-83] 83 (11/01 0500) Resp:  [20] 20 (11/01 0500) BP: (93-101)/(43-55) 101/55 (11/01 0500) SpO2:  [92 %-99 %] 99 % (11/01 0500) Weight:  [144 lb 6.4 oz (65.5 kg)] 144 lb 6.4 oz (65.5 kg) (11/01 0500) Last BM Date: 11/11/15 General:   Alert,  Well-developed, well-nourished, pleasant and cooperative in NAD Head:  Normocephalic and atraumatic. Eyes:  Sclera icterus.  Chest: CTA bilaterally without rales, rhonchi, crackles.    Heart:  Regular rate and rhythm; no murmurs, clicks, rubs,  or gallops. Abdomen:  Soft, mild epigastric tenderness and nondistended.  Normal bowel sounds, without guarding, and without rebound.   Extremities:  Without clubbing, deformity or edema. Neurologic:  Alert and  oriented x4;  grossly normal neurologically. Skin:  Intact without significant lesions or rashes.+jaundice Psych:  Alert and cooperative. Normal mood and affect.  Intake/Output from previous day: 10/31 0701 - 11/01 0700 In: 1643.5 [P.O.:540; I.V.:403.5; IV Piggyback:700] Out: 1100 [Urine:1100] Intake/Output this shift: No intake/output data recorded.  Lab Results: CBC  Recent Labs  11/11/15 2147 11/12/15 0300 11/13/15 0607  WBC 14.3* 14.7* 7.4  HGB 13.6 12.7 11.2*  HCT 41.1 38.5 36.2  MCV 98.3 98.5 102.3*  PLT 149* 145* 142*   BMET  Recent Labs  11/11/15 2147 11/12/15 0300 11/13/15 0607  NA 134* 135 140  K 3.6 3.3* 4.2  CL 98* 100* 106  CO2 '25 25 31  '$ GLUCOSE 145* 158* 99  BUN '14 11 12  '$ CREATININE <0.30* <0.30* 0.49  CALCIUM 9.5 9.3 9.2   LFTs  Recent Labs  11/11/15 2147 11/12/15 0300 11/13/15 0607  BILITOT 10.2*  10.2* 10.9* 2.7*  BILIDIR 6.9*  --   --   IBILI 3.3*  --   --   ALKPHOS 181* 183* 140*  AST 93* 93* 57*  ALT 152* 152* 101*  PROT 7.2 7.3 5.5*  ALBUMIN 3.4* 3.6 2.6*     Recent Labs  11/11/15 2147 11/12/15 0300 11/13/15 0607  LIPASE 351* 264* 24   PT/INR  Recent Labs  11/12/15 0300  LABPROT 13.9  INR 1.07      Imaging Studies: Dg Chest 2 View  Result Date: 11/11/2015 CLINICAL DATA:  Acute onset of generalized weakness and shortness of breath. Nausea and vomiting. Initial encounter. EXAM: CHEST  2 VIEW COMPARISON:  Chest radiograph from 01/13/2015 FINDINGS: The lungs are well-aerated. Vascular congestion is noted. Increased interstitial markings raise concern for mild pulmonary edema. No pneumothorax is seen. Scarring is seen at the lung apices. There is new prominence of the right hilum, raising concern for underlying mass. The heart is enlarged. No acute osseous abnormalities are seen. Clips are noted overlying the left chest wall. IMPRESSION: 1. Vascular congestion and cardiomegaly. Increased interstitial markings raise concern for mild pulmonary edema. 2. New prominence of the right hilum, raising concern for underlying mass. CT of the chest would be helpful for further evaluation, when and as deemed clinically appropriate. 3. Scarring at the lung apices. Electronically Signed   By: Garald Balding M.D.   On: 11/11/2015 22:41   Ct Chest Wo Contrast  Result Date: 11/12/2015 CLINICAL DATA:  82 y/o F; history of breast cancer presenting with shortness of breath and abnormal chest radiograph. EXAM: CT CHEST WITHOUT CONTRAST TECHNIQUE: Multidetector CT imaging of the chest was performed  following the standard protocol without IV contrast. COMPARISON:  11/11/2015 chest radiograph. 09/15/2013 CT abdomen and pelvis FINDINGS: Cardiovascular: Aortic atherosclerosis with moderate calcifications. The descending thoracic aorta near the hilum measures 4.2 cm and there is a stable linear calcification traversing the (series 2, image 101) that previously measured 3.9 cm in 2015. Borderline size of main pulmonary artery. Normal heart size. No pericardial effusion.  Moderate to severe coronary artery calcification. Mediastinum/Nodes: No enlarged mediastinal or axillary lymph nodes. Thyroid gland, trachea, and esophagus demonstrate no significant findings. Lungs/Pleura: Moderate predominantly paraseptal emphysema greatest in the mid and upper lung zones. Biapical pleural parenchymal scarring. Pulmonary nodules: Right upper lobe ground-glass 12 mm, series 3: Image 38. Right upper lobe ground-glass 6 mm, 3:57. There are several additional scattered 2-3 mm pulmonary nodules. There are reticular opacities at the lung bases probably representing mild fibrosis. Right basilar platelike atelectasis. No consolidation. Prominence of the right hilum on radiograph corresponds to prominent central pulmonary arteries. Upper Abdomen: Status post cholecystectomy. Nonspecific calcifications right renal hilum. Findings suggestive of intrahepatic biliary ductal dilatation and dilated common bile duct to 20 mm. Left kidney 2 mm stone. Small hiatal hernia. Musculoskeletal: No acute osseous abnormality identified. IMPRESSION: 1. Right hilar prominence that x-ray corresponds to large central pulmonary vessels probably exaggerated by patient's rotation. 2. Descending thoracic aortic aneurysm measuring 4.2 cm, 3.9 cm in 2015 with chronic dissection flap. 3. Moderate predominantly paraseptal emphysema of the lungs. 4. **An incidental finding of potential clinical significance has been found. Multiple ground-glass nodules measuring up to 12 mm. Non-contrast chest CT at 3-6 months is recommended. If nodules persist, subsequent management will be based upon the most suspicious nodule(s). This recommendation follows the consensus statement: Guidelines for Management of Incidental Pulmonary Nodules Detected on CT Images: From the Fleischner Society 2017; Radiology 2017; 284:228-243.** 5. Dilated common bile duct to 20 mm and findings suggestive of intrahepatic biliary ductal dilatation. Correlation with liver  enzymes and possible abdominal imaging is recommended as clinically indicated. Electronically Signed   By: Kristine Garbe M.D.   On: 11/12/2015 01:19   US Abdomen Complete  Result Date: 11/12/2015 CLINICAL DATA:  New onset of jaundice and pancreatitis. History of previous cholecystectomy, episodes of pancreatitis, breast malignancy. EXAM: ABDOMEN ULTRASOUND COMPLETE COMPARISON:  Abdominal and pelvic noncontrast CT scan of September 15, 2013 FINDINGS: Gallbladder: The gallbladder is surgically absent. Common bile duct: Diameter: 1.2 cm. A few intraluminal echoes are observed. Liver: The hepatic echotexture is mildly increased diffusely. There is mild intrahepatic ductal dilation which is not entirely new. No discrete hepatic mass is observed. IVC: No abnormality visualized. Pancreas: The pancreatic tail is partially obscured by bowel gas. The common bile duct at the pancreatic head measures 4 mm in diameter. No discrete pancreatic mass is observed. Spleen: Size and appearance within normal limits. Right Kidney: Length: 11.9 cm. Echogenicity within normal limits. No mass or hydronephrosis visualized. Left Kidney: Length: 11.3 cm. There is a lower pole simple appearing cyst measuring 3.4 x 3.5 by 3.2 cm which has increased in size since the previous study. There is no hydronephrosis nor focal solid mass. Abdominal aorta: No aneurysm visualized. Other findings: There is no ascites. IMPRESSION: 1. No definite sonographic evidence of acute pancreatitis. There is mild dilation of the common bile duct and intrahepatic ducts with a few intraluminal echoes in the CBD which may reflect gravel or stones. Abdominal MRI - MRCP would be a useful next imaging steps. 2. Probable fatty infiltrative change of the liver. 3.  Simple appearing cyst in the lower pole of the left kidney. Electronically Signed   By: David  Martinique M.D.   On: 11/12/2015 09:20   Mr 3d Recon At Scanner  Result Date: 11/12/2015 CLINICAL DATA:   Abnormal LFTs, abnormal CBD on CT chest and abdominal ultrasound EXAM: MRI ABDOMEN WITHOUT AND WITH CONTRAST (INCLUDING MRCP) TECHNIQUE: Multiplanar multisequence MR imaging of the abdomen was performed both before and after the administration of intravenous contrast. Heavily T2-weighted images of the biliary and pancreatic ducts were obtained, and three-dimensional MRCP images were rendered by post processing. CONTRAST:  23m MULTIHANCE GADOBENATE DIMEGLUMINE 529 MG/ML IV SOLN COMPARISON:  Abdominal ultrasound dated 11/12/2015. CT chest dated 11/12/2015. FINDINGS: Motion degraded images. Lower chest: Mild subpleural patchy opacities in the bilateral lower lobes, likely reflecting subpleural reticulation/fibrosis when correlating with recent CT, although superimposed atelectasis is possible. Hepatobiliary: Liver is within normal limits. No suspicious/enhancing hepatic lesions. No hepatic steatosis. Status post cholecystectomy. Dilated common duct, measuring up to 17 mm, with multiple mid/distal CBD stones measuring up to 12 mm. Up to eight CBD stones are suspected. 2-3 cystic duct remnant stones are also suspected, measuring up to 7 mm. Pancreas:  Within normal limits. Spleen:  Within normal limits. Adrenals/Urinary Tract:  Adrenal glands are within normal limits. Dominant anterior left lower pole renal cysts measuring 4.3 cm, with mildly irregular margins (series 4/ image 31), but no enhancing/solid components. Additional small bilateral renal cysts. No hydronephrosis. Stomach/Bowel: Stomach is within normal limits. Visualized bowel is unremarkable. Vascular/Lymphatic:  No evidence of abdominal aortic aneurysm. No suspicious abdominal lymphadenopathy. Other:  No abdominal ascites. Musculoskeletal: Degenerative changes of the visualized thoracolumbar spine with mild upper lumbar levoscoliosis. IMPRESSION: Motion degraded images. Status post cholecystectomy. Dilated common duct, measuring up to 17 mm, with multiple  low mid/distal CBD stones measuring up to 12 mm, as above. 2-3 cystic duct remnant stones are also suspected. Additional ancillary findings as above. Electronically Signed   By: SJulian HyM.D.   On: 11/12/2015 17:23   Mr Abdomen Mrcp WMoise BoringContast  Result Date: 11/12/2015 CLINICAL DATA:  Abnormal LFTs, abnormal CBD on CT chest and abdominal ultrasound EXAM: MRI ABDOMEN WITHOUT AND WITH CONTRAST (INCLUDING MRCP) TECHNIQUE: Multiplanar multisequence MR imaging of the abdomen was performed both before and after the administration of intravenous contrast. Heavily T2-weighted images of the biliary and pancreatic ducts were obtained, and three-dimensional MRCP images were rendered by post processing. CONTRAST:  156mMULTIHANCE GADOBENATE DIMEGLUMINE 529 MG/ML IV SOLN COMPARISON:  Abdominal ultrasound dated 11/12/2015. CT chest dated 11/12/2015. FINDINGS: Motion degraded images. Lower chest: Mild subpleural patchy opacities in the bilateral lower lobes, likely reflecting subpleural reticulation/fibrosis when correlating with recent CT, although superimposed atelectasis is possible. Hepatobiliary: Liver is within normal limits. No suspicious/enhancing hepatic lesions. No hepatic steatosis. Status post cholecystectomy. Dilated common duct, measuring up to 17 mm, with multiple mid/distal CBD stones measuring up to 12 mm. Up to eight CBD stones are suspected. 2-3 cystic duct remnant stones are also suspected, measuring up to 7 mm. Pancreas:  Within normal limits. Spleen:  Within normal limits. Adrenals/Urinary Tract:  Adrenal glands are within normal limits. Dominant anterior left lower pole renal cysts measuring 4.3 cm, with mildly irregular margins (series 4/ image 31), but no enhancing/solid components. Additional small bilateral renal cysts. No hydronephrosis. Stomach/Bowel: Stomach is within normal limits. Visualized bowel is unremarkable. Vascular/Lymphatic:  No evidence of abdominal aortic aneurysm. No  suspicious abdominal lymphadenopathy. Other:  No abdominal ascites. Musculoskeletal: Degenerative changes  of the visualized thoracolumbar spine with mild upper lumbar levoscoliosis. IMPRESSION: Motion degraded images. Status post cholecystectomy. Dilated common duct, measuring up to 17 mm, with multiple low mid/distal CBD stones measuring up to 12 mm, as above. 2-3 cystic duct remnant stones are also suspected. Additional ancillary findings as above. Electronically Signed   By: Julian Hy M.D.   On: 11/12/2015 17:23  [2 weeks]   Assessment: 82 year old female admitted with acute abdominal pain, N/V, markedly elevated bilirubin with predominantly direct bilirubin elevated, elevated lipase with likely mild biliary pancreatitis. MRCP has confirmed multiple CBD stones. LFTs improved today. Blood culture with GNRs from both aerobic and anaerobic bottles, ID panel positive for three organisms, Enterobacteriaceae species, Klebsiella oxytoca, Klebsiella pneumoniae? Cannot rule out cholangitis. Afebrile. Leukocytosis has resolved. Clinically improved. Abx coverage consists of Cipro/Flagyl due to anaphylaxis to Ceftin.   Plan: 1. Multiple pulmonary nodules, follow up as per Dr. Luan Pulling.  2. ERCP Thursday. Hold Lovenox after today's dose.  3. IV fluids decreased due to ?mild pulmonary edema on CXR.  4. Continue clear liquids today, NPO after midnight.  5. Continue current antibiotic regimen for now, make adjustments based on sensitivities as available.   Laureen Ochs. Bernarda Caffey [facility] [phone] 11/1/20178:59 AM     LOS: 2 days

## 2015-11-13 NOTE — Care Management Note (Signed)
Case Management Note  Patient Details  Name: [name] MRN: [id number] Date of Birth: January 03, 1926  Subjective/Objective:                  Pt admitted with pancreatitis. She is from home, lives alone and is ind with ADL's. She has family who live nearby. She has PCP, she drives to her appointments and she has no difficulty affording her medications. She has continuous supplemental oxygen at home PTA. She has a neb machine. She plans to return home with self care at DC.  Action/Plan: No needs anticipated.   Expected Discharge Date:    11/15/2015              Expected Discharge Plan:  Home/Self Care  In-House Referral:  NA  Discharge planning Services  CM Consult  Post Acute Care Choice:  NA Choice offered to:  NA  Status of Service:  Completed, signed off Sherald Barge, RN 11/13/2015, 11:03 AM

## 2015-11-13 NOTE — Anesthesia Preprocedure Evaluation (Addendum)
Anesthesia Evaluation  Patient identified by MRN, date of birth, ID band Patient awake    Reviewed: Allergy & Precautions, NPO status , Patient's Chart, lab work & pertinent test results, reviewed documented beta blocker date and time   History of Anesthesia Complications (+) PONV and history of anesthetic complications  Airway Mallampati: I  TM Distance: >3 FB     Dental  (+) Teeth Intact, Partial Lower,    Pulmonary COPD, former smoker,    breath sounds clear to auscultation       Cardiovascular hypertension, Pt. on home beta blockers and Pt. on medications + CAD   Rhythm:Regular Rate:Normal     Neuro/Psych PSYCHIATRIC DISORDERS Anxiety Depression    GI/Hepatic GERD  ,  Endo/Other    Renal/GU      Musculoskeletal   Abdominal   Peds  Hematology   Anesthesia Other Findings   Reproductive/Obstetrics                            Anesthesia Physical Anesthesia Plan  ASA: III  Anesthesia Plan: General   Post-op Pain Management:    Induction: Intravenous  Airway Management Planned: Oral ETT  Additional Equipment:   Intra-op Plan:   Post-operative Plan: Extubation in OR  Informed Consent: I have reviewed the patients History and Physical, chart, labs and discussed the procedure including the risks, benefits and alternatives for the proposed anesthesia with the patient or authorized representative who has indicated his/her understanding and acceptance.     Plan Discussed with:   Anesthesia Plan Comments: (amidate)        Anesthesia Quick Evaluation

## 2015-11-13 NOTE — Progress Notes (Signed)
PHARMACY - PHYSICIAN COMMUNICATION CRITICAL VALUE ALERT - BLOOD CULTURE IDENTIFICATION (BCID)  Results for orders placed or performed during the hospital encounter of 11/11/15  Blood Culture ID Panel (Reflexed) (Collected: 11/12/2015 12:30 AM)  Result Value Ref Range   Enterococcus species NOT DETECTED NOT DETECTED   Vancomycin resistance NOT DETECTED NOT DETECTED   Listeria monocytogenes NOT DETECTED NOT DETECTED   Staphylococcus species NOT DETECTED NOT DETECTED   Staphylococcus aureus NOT DETECTED NOT DETECTED   Streptococcus species NOT DETECTED NOT DETECTED   Streptococcus agalactiae NOT DETECTED NOT DETECTED   Streptococcus pneumoniae NOT DETECTED NOT DETECTED   Streptococcus pyogenes NOT DETECTED NOT DETECTED   Acinetobacter baumannii NOT DETECTED NOT DETECTED   Enterobacteriaceae species DETECTED (A) NOT DETECTED   Enterobacter cloacae complex NOT DETECTED NOT DETECTED   Escherichia coli NOT DETECTED NOT DETECTED   Klebsiella oxytoca DETECTED (A) NOT DETECTED   Klebsiella pneumoniae DETECTED (A) NOT DETECTED   Proteus species NOT DETECTED NOT DETECTED   Serratia marcescens NOT DETECTED NOT DETECTED   Carbapenem resistance NOT DETECTED NOT DETECTED   Haemophilus influenzae NOT DETECTED NOT DETECTED   Neisseria meningitidis NOT DETECTED NOT DETECTED   Pseudomonas aeruginosa NOT DETECTED NOT DETECTED   Candida albicans NOT DETECTED NOT DETECTED   Candida glabrata NOT DETECTED NOT DETECTED   Candida krusei NOT DETECTED NOT DETECTED   Candida parapsilosis NOT DETECTED NOT DETECTED   Candida tropicalis NOT DETECTED NOT DETECTED    Name of physician (or Provider) Contacted: Dr. Luan Pulling  Assessment: No changes in current antibiotic regimen of Cipro and Flagyl at this time. Patient has ceftin allergy with anaphylaxis. Presumed diagnosis is cholangitis, and appropriate antibiotic treatment. Patient is improved this AM, feels better, and WBC improved. Will follow up on  sensitivities.  Isac Sarna, BS Pharm D, [place] Clinical Pharmacist Pager [phone] 11/13/2015  8:55 AM

## 2015-11-13 NOTE — Progress Notes (Signed)
Subjective: She says she feels substantially better. No abdominal pain now. Her breathing is doing okay. Her MRCP shows stones in the cystic duct. She has positive blood cultures it appeared to be for 2 different types of Klebsiella. She is on Cipro and Flagyl and improving but we don't have sensitivities yet.  Objective: Vital signs in last 24 hours: Temp:  [98.2 F (36.8 C)-98.9 F (37.2 C)] 98.2 F (36.8 C) (11/01 0500) Pulse Rate:  [73-83] 83 (11/01 0500) Resp:  [20] 20 (11/01 0500) BP: (93-101)/(43-55) 101/55 (11/01 0500) SpO2:  [85 %-99 %] 85 % (11/01 0827) Weight:  [65.5 kg (144 lb 6.4 oz)] 65.5 kg (144 lb 6.4 oz) (11/01 0500) Weight change: 1.089 kg (2 lb 6.4 oz) Last BM Date: 11/11/15  Intake/Output from previous day: 10/31 0701 - 11/01 0700 In: 1643.5 [P.O.:540; I.V.:403.5; IV Piggyback:700] Out: 1100 [Urine:1100]  PHYSICAL EXAM General appearance: alert, cooperative and mild distress Resp: rhonchi bilaterally Cardio: regular rate and rhythm, S1, S2 normal, no murmur, click, rub or gallop GI: her abdominal tenderness is better. Extremities: extremities normal, atraumatic, no cyanosis or edema skin:still jaundiced. mucus membranes moist  Lab Results:  Results for orders placed or performed during the hospital encounter of 11/11/15 (from the past 48 hour(s))  Urinalysis, Routine w reflex microscopic     Status: Abnormal   Collection Time: 11/11/15  9:15 PM  Result Value Ref Range   Color, Urine YELLOW YELLOW   APPearance CLEAR CLEAR   Specific Gravity, Urine 1.010 1.005 - 1.030   pH 7.0 5.0 - 8.0   Glucose, UA NEGATIVE NEGATIVE mg/dL   Hgb urine dipstick SMALL (A) NEGATIVE   Bilirubin Urine LARGE (A) NEGATIVE   Ketones, ur 40 (A) NEGATIVE mg/dL   Protein, ur TRACE (A) NEGATIVE mg/dL   Nitrite NEGATIVE NEGATIVE   Leukocytes, UA NEGATIVE NEGATIVE  Urine microscopic-add on     Status: Abnormal   Collection Time: 11/11/15  9:15 PM  Result Value Ref Range    Squamous Epithelial / LPF 0-5 (A) NONE SEEN   WBC, UA 0-5 0 - 5 WBC/hpf   RBC / HPF 0-5 0 - 5 RBC/hpf   Bacteria, UA RARE (A) NONE SEEN  Comprehensive metabolic panel     Status: Abnormal   Collection Time: 11/11/15  9:47 PM  Result Value Ref Range   Sodium 134 (L) 135 - 145 mmol/L   Potassium 3.6 3.5 - 5.1 mmol/L   Chloride 98 (L) 101 - 111 mmol/L   CO2 25 22 - 32 mmol/L   Glucose, Bld 145 (H) 65 - 99 mg/dL   BUN 14 6 - 20 mg/dL   Creatinine, Ser <0.30 (L) 0.44 - 1.00 mg/dL   Calcium 9.5 8.9 - 10.3 mg/dL   Total Protein 7.2 6.5 - 8.1 g/dL   Albumin 3.4 (L) 3.5 - 5.0 g/dL   AST 93 (H) 15 - 41 U/L   ALT 152 (H) 14 - 54 U/L   Alkaline Phosphatase 181 (H) 38 - 126 U/L   Total Bilirubin 10.2 (H) 0.3 - 1.2 mg/dL   GFR calc non Af Amer NOT CALCULATED >60 mL/min   GFR calc Af Amer NOT CALCULATED >60 mL/min    Comment: (NOTE) The eGFR has been calculated using the CKD EPI equation. This calculation has not been validated in all clinical situations. eGFR's persistently <60 mL/min signify possible Chronic Kidney Disease.    Anion gap 11 5 - 15  Lipase, blood     Status:  Abnormal   Collection Time: 11/11/15  9:47 PM  Result Value Ref Range   Lipase 351 (H) 11 - 51 U/L  Troponin I     Status: None   Collection Time: 11/11/15  9:47 PM  Result Value Ref Range   Troponin I <0.03 <0.03 ng/mL  Lactic acid, plasma     Status: None   Collection Time: 11/11/15  9:47 PM  Result Value Ref Range   Lactic Acid, Venous 0.9 0.5 - 1.9 mmol/L  CBC with Differential     Status: Abnormal   Collection Time: 11/11/15  9:47 PM  Result Value Ref Range   WBC 14.3 (H) 4.0 - 10.5 K/uL   RBC 4.18 3.87 - 5.11 MIL/uL   Hemoglobin 13.6 12.0 - 15.0 g/dL   HCT 41.1 36.0 - 46.0 %   MCV 98.3 78.0 - 100.0 fL   MCH 32.5 26.0 - 34.0 pg   MCHC 33.1 30.0 - 36.0 g/dL   RDW 13.1 11.5 - 15.5 %   Platelets 149 (L) 150 - 400 K/uL   Neutrophils Relative % 86 %   Neutro Abs 12.2 (H) 1.7 - 7.7 K/uL   Lymphocytes  Relative 6 %   Lymphs Abs 0.9 0.7 - 4.0 K/uL   Monocytes Relative 8 %   Monocytes Absolute 1.2 (H) 0.1 - 1.0 K/uL   Eosinophils Relative 0 %   Eosinophils Absolute 0.0 0.0 - 0.7 K/uL   Basophils Relative 0 %   Basophils Absolute 0.0 0.0 - 0.1 K/uL  Hepatitis panel, acute     Status: None   Collection Time: 11/11/15  9:47 PM  Result Value Ref Range   Hepatitis B Surface Ag Negative Negative   HCV Ab <0.1 0.0 - 0.9 s/co ratio    Comment: (NOTE)                                  Negative:     < 0.8                             Indeterminate: 0.8 - 0.9                                  Positive:     > 0.9 The CDC recommends that a positive HCV antibody result be followed up with a HCV Nucleic Acid Amplification test (161096). Performed At: [facility] [place], [place] [id number] Lindon Romp MD BJ:[id number]    Hep A IgM Indeterminate Negative    Comment: **Verified by repeat analysis**   Hep B C IgM Negative Negative  Brain natriuretic peptide     Status: Abnormal   Collection Time: 11/11/15  9:47 PM  Result Value Ref Range   B Natriuretic Peptide 184.0 (H) 0.0 - 100.0 pg/mL  Bilirubin, fractionated(tot/dir/indir)     Status: Abnormal   Collection Time: 11/11/15  9:47 PM  Result Value Ref Range   Total Bilirubin 10.2 (H) 0.3 - 1.2 mg/dL   Bilirubin, Direct 6.9 (H) 0.1 - 0.5 mg/dL   Indirect Bilirubin 3.3 (H) 0.3 - 0.9 mg/dL  Triglycerides     Status: None   Collection Time: 11/11/15  9:47 PM  Result Value Ref Range   Triglycerides 106 <150 mg/dL  Culture, blood (Routine X 2) w Reflex  to ID Panel     Status: None (Preliminary result)   Collection Time: 11/12/15 12:30 AM  Result Value Ref Range   Specimen Description BLOOD RIGHT ANTECUBITAL    Special Requests BOTTLES DRAWN AEROBIC AND ANAEROBIC 5CC EACH    Culture  Setup Time      GRAM NEGATIVE RODS recovered from aerobic and anaerobic bottles Gram Stain Report Called to,Read Back By and Verified  With: Dickerson,M at 1700 by Flynt,H 11/12/15 Performed at [facility] ID to follow Performed at [place] 1 DAY    Report Status PENDING   Blood Culture ID Panel (Reflexed)     Status: Abnormal   Collection Time: 11/12/15 12:30 AM  Result Value Ref Range   Enterococcus species NOT DETECTED NOT DETECTED   Vancomycin resistance NOT DETECTED NOT DETECTED   Listeria monocytogenes NOT DETECTED NOT DETECTED   Staphylococcus species NOT DETECTED NOT DETECTED   Staphylococcus aureus NOT DETECTED NOT DETECTED   Streptococcus species NOT DETECTED NOT DETECTED   Streptococcus agalactiae NOT DETECTED NOT DETECTED   Streptococcus pneumoniae NOT DETECTED NOT DETECTED   Streptococcus pyogenes NOT DETECTED NOT DETECTED   Acinetobacter baumannii NOT DETECTED NOT DETECTED   Enterobacteriaceae species DETECTED (A) NOT DETECTED    Comment: CRITICAL RESULT CALLED TO, READ BACK BY AND VERIFIED WITH: CHRISTY THOMAS,RN @0455  11/13/15 MKELLY,MLT    Enterobacter cloacae complex NOT DETECTED NOT DETECTED   Escherichia coli NOT DETECTED NOT DETECTED   Klebsiella oxytoca DETECTED (A) NOT DETECTED    Comment: CRITICAL RESULT CALLED TO, READ BACK BY AND VERIFIED WITH: CHRISTY THOAMS,RN @0455  11/13/15 MKELLY,MLT    Klebsiella pneumoniae DETECTED (A) NOT DETECTED    Comment: CRITICAL RESULT CALLED TO, READ BACK BY AND VERIFIED WITH: CHRISTY THOMAS,RN @0455  11/13/15 MKELLY,MLT    Proteus species NOT DETECTED NOT DETECTED   Serratia marcescens NOT DETECTED NOT DETECTED   Carbapenem resistance NOT DETECTED NOT DETECTED   Haemophilus influenzae NOT DETECTED NOT DETECTED   Neisseria meningitidis NOT DETECTED NOT DETECTED   Pseudomonas aeruginosa NOT DETECTED NOT DETECTED   Candida albicans NOT DETECTED NOT DETECTED   Candida glabrata NOT DETECTED NOT DETECTED   Candida krusei NOT DETECTED NOT DETECTED   Candida parapsilosis NOT DETECTED NOT DETECTED   Candida  tropicalis NOT DETECTED NOT DETECTED    Comment: Performed at [facility]  Lactic acid, plasma     Status: None   Collection Time: 11/12/15 12:36 AM  Result Value Ref Range   Lactic Acid, Venous 1.1 0.5 - 1.9 mmol/L  Culture, blood (Routine X 2) w Reflex to ID Panel     Status: None (Preliminary result)   Collection Time: 11/12/15 12:41 AM  Result Value Ref Range   Specimen Description BLOOD RIGHT HAND    Special Requests BOTTLES DRAWN AEROBIC AND ANAEROBIC 4CC EACH    Culture  Setup Time      GRAM NEGATIVE RODS RECOVERED FROM AEROBIC AND ANAEROBIC BOTTLES Gram Stain Report Called to,Read Back By and Verified With: DICKERSON,M AT 1700 BY FLYNT,H ON 11/12/15 Performed at [facility]    Culture NO GROWTH 1 DAY    Report Status PENDING   Comprehensive metabolic panel     Status: Abnormal   Collection Time: 11/12/15  3:00 AM  Result Value Ref Range   Sodium 135 135 - 145 mmol/L   Potassium 3.3 (L) 3.5 - 5.1 mmol/L   Chloride 100 (L) 101 - 111  mmol/L   CO2 25 22 - 32 mmol/L   Glucose, Bld 158 (H) 65 - 99 mg/dL   BUN 11 6 - 20 mg/dL   Creatinine, Ser <0.30 (L) 0.44 - 1.00 mg/dL   Calcium 9.3 8.9 - 10.3 mg/dL   Total Protein 7.3 6.5 - 8.1 g/dL   Albumin 3.6 3.5 - 5.0 g/dL   AST 93 (H) 15 - 41 U/L   ALT 152 (H) 14 - 54 U/L   Alkaline Phosphatase 183 (H) 38 - 126 U/L   Total Bilirubin 10.9 (H) 0.3 - 1.2 mg/dL   GFR calc non Af Amer NOT CALCULATED >60 mL/min   GFR calc Af Amer NOT CALCULATED >60 mL/min    Comment: (NOTE) The eGFR has been calculated using the CKD EPI equation. This calculation has not been validated in all clinical situations. eGFR's persistently <60 mL/min signify possible Chronic Kidney Disease.    Anion gap 10 5 - 15  CBC     Status: Abnormal   Collection Time: 11/12/15  3:00 AM  Result Value Ref Range   WBC 14.7 (H) 4.0 - 10.5 K/uL   RBC 3.91 3.87 - 5.11 MIL/uL   Hemoglobin 12.7 12.0 - 15.0 g/dL   HCT 38.5 36.0 - 46.0 %   MCV 98.5 78.0 -  100.0 fL   MCH 32.5 26.0 - 34.0 pg   MCHC 33.0 30.0 - 36.0 g/dL   RDW 13.3 11.5 - 15.5 %   Platelets 145 (L) 150 - 400 K/uL  Protime-INR     Status: None   Collection Time: 11/12/15  3:00 AM  Result Value Ref Range   Prothrombin Time 13.9 11.4 - 15.2 seconds   INR 1.07   Amylase     Status: Abnormal   Collection Time: 11/12/15  3:00 AM  Result Value Ref Range   Amylase 326 (H) 28 - 100 U/L  Lipase, blood     Status: Abnormal   Collection Time: 11/12/15  3:00 AM  Result Value Ref Range   Lipase 264 (H) 11 - 51 U/L  Lactic acid, plasma     Status: None   Collection Time: 11/12/15  3:00 AM  Result Value Ref Range   Lactic Acid, Venous 0.8 0.5 - 1.9 mmol/L  Glucose, capillary     Status: Abnormal   Collection Time: 11/12/15  7:29 AM  Result Value Ref Range   Glucose-Capillary 139 (H) 65 - 99 mg/dL  Comprehensive metabolic panel     Status: Abnormal   Collection Time: 11/13/15  6:07 AM  Result Value Ref Range   Sodium 140 135 - 145 mmol/L   Potassium 4.2 3.5 - 5.1 mmol/L    Comment: DELTA CHECK NOTED   Chloride 106 101 - 111 mmol/L   CO2 31 22 - 32 mmol/L   Glucose, Bld 99 65 - 99 mg/dL   BUN 12 6 - 20 mg/dL   Creatinine, Ser 0.49 0.44 - 1.00 mg/dL   Calcium 9.2 8.9 - 10.3 mg/dL   Total Protein 5.5 (L) 6.5 - 8.1 g/dL   Albumin 2.6 (L) 3.5 - 5.0 g/dL   AST 57 (H) 15 - 41 U/L   ALT 101 (H) 14 - 54 U/L   Alkaline Phosphatase 140 (H) 38 - 126 U/L   Total Bilirubin 2.7 (H) 0.3 - 1.2 mg/dL   GFR calc non Af Amer >60 >60 mL/min   GFR calc Af Amer >60 >60 mL/min    Comment: (NOTE) The eGFR has  been calculated using the CKD EPI equation. This calculation has not been validated in all clinical situations. eGFR's persistently <60 mL/min signify possible Chronic Kidney Disease.    Anion gap 3 (L) 5 - 15  CBC with Differential/Platelet     Status: Abnormal   Collection Time: 11/13/15  6:07 AM  Result Value Ref Range   WBC 7.4 4.0 - 10.5 K/uL   RBC 3.54 (L) 3.87 - 5.11 MIL/uL    Hemoglobin 11.2 (L) 12.0 - 15.0 g/dL   HCT 36.2 36.0 - 46.0 %   MCV 102.3 (H) 78.0 - 100.0 fL   MCH 31.6 26.0 - 34.0 pg   MCHC 30.9 30.0 - 36.0 g/dL   RDW 13.6 11.5 - 15.5 %   Platelets 142 (L) 150 - 400 K/uL   Neutrophils Relative % 64 %   Neutro Abs 4.8 1.7 - 7.7 K/uL   Lymphocytes Relative 18 %   Lymphs Abs 1.3 0.7 - 4.0 K/uL   Monocytes Relative 14 %   Monocytes Absolute 1.1 (H) 0.1 - 1.0 K/uL   Eosinophils Relative 3 %   Eosinophils Absolute 0.2 0.0 - 0.7 K/uL   Basophils Relative 1 %   Basophils Absolute 0.0 0.0 - 0.1 K/uL  Lipase, blood     Status: None   Collection Time: 11/13/15  6:07 AM  Result Value Ref Range   Lipase 24 11 - 51 U/L  Glucose, capillary     Status: Abnormal   Collection Time: 11/13/15  7:25 AM  Result Value Ref Range   Glucose-Capillary 100 (H) 65 - 99 mg/dL   Comment 1 Notify RN    Comment 2 Document in Chart     ABGS No results for input(s): PHART, PO2ART, TCO2, HCO3 in the last 72 hours.  Invalid input(s): PCO2 CULTURES Recent Results (from the past 240 hour(s))  Culture, blood (Routine X 2) w Reflex to ID Panel     Status: None (Preliminary result)   Collection Time: 11/12/15 12:30 AM  Result Value Ref Range Status   Specimen Description BLOOD RIGHT ANTECUBITAL  Final   Special Requests BOTTLES DRAWN AEROBIC AND ANAEROBIC 5CC EACH  Final   Culture  Setup Time   Final    GRAM NEGATIVE RODS recovered from aerobic and anaerobic bottles Gram Stain Report Called to,Read Back By and Verified With: Dickerson,M at 1700 by Flynt,H 11/12/15 Performed at [facility] ID to follow Performed at [place] 1 DAY  Final   Report Status PENDING  Incomplete  Blood Culture ID Panel (Reflexed)     Status: Abnormal   Collection Time: 11/12/15 12:30 AM  Result Value Ref Range Status   Enterococcus species NOT DETECTED NOT DETECTED Final   Vancomycin resistance NOT DETECTED NOT DETECTED Final   Listeria  monocytogenes NOT DETECTED NOT DETECTED Final   Staphylococcus species NOT DETECTED NOT DETECTED Final   Staphylococcus aureus NOT DETECTED NOT DETECTED Final   Streptococcus species NOT DETECTED NOT DETECTED Final   Streptococcus agalactiae NOT DETECTED NOT DETECTED Final   Streptococcus pneumoniae NOT DETECTED NOT DETECTED Final   Streptococcus pyogenes NOT DETECTED NOT DETECTED Final   Acinetobacter baumannii NOT DETECTED NOT DETECTED Final   Enterobacteriaceae species DETECTED (A) NOT DETECTED Final    Comment: CRITICAL RESULT CALLED TO, READ BACK BY AND VERIFIED WITH: CHRISTY THOMAS,RN @0455  11/13/15 MKELLY,MLT    Enterobacter cloacae complex NOT DETECTED NOT DETECTED Final   Escherichia coli NOT  DETECTED NOT DETECTED Final   Klebsiella oxytoca DETECTED (A) NOT DETECTED Final    Comment: CRITICAL RESULT CALLED TO, READ BACK BY AND VERIFIED WITH: CHRISTY THOAMS,RN @0455  11/13/15 MKELLY,MLT    Klebsiella pneumoniae DETECTED (A) NOT DETECTED Final    Comment: CRITICAL RESULT CALLED TO, READ BACK BY AND VERIFIED WITH: CHRISTY THOMAS,RN @0455  11/13/15 MKELLY,MLT    Proteus species NOT DETECTED NOT DETECTED Final   Serratia marcescens NOT DETECTED NOT DETECTED Final   Carbapenem resistance NOT DETECTED NOT DETECTED Final   Haemophilus influenzae NOT DETECTED NOT DETECTED Final   Neisseria meningitidis NOT DETECTED NOT DETECTED Final   Pseudomonas aeruginosa NOT DETECTED NOT DETECTED Final   Candida albicans NOT DETECTED NOT DETECTED Final   Candida glabrata NOT DETECTED NOT DETECTED Final   Candida krusei NOT DETECTED NOT DETECTED Final   Candida parapsilosis NOT DETECTED NOT DETECTED Final   Candida tropicalis NOT DETECTED NOT DETECTED Final    Comment: Performed at [facility]  Culture, blood (Routine X 2) w Reflex to ID Panel     Status: None (Preliminary result)   Collection Time: 11/12/15 12:41 AM  Result Value Ref Range Status   Specimen Description BLOOD RIGHT HAND   Final   Special Requests BOTTLES DRAWN AEROBIC AND ANAEROBIC 4CC EACH  Final   Culture  Setup Time   Final    GRAM NEGATIVE RODS RECOVERED FROM AEROBIC AND ANAEROBIC BOTTLES Gram Stain Report Called to,Read Back By and Verified With: DICKERSON,M AT 1700 BY FLYNT,H ON 11/12/15 Performed at [facility] 1 DAY  Final   Report Status PENDING  Incomplete   Studies/Results: Dg Chest 2 View  Result Date: 11/11/2015 CLINICAL DATA:  Acute onset of generalized weakness and shortness of breath. Nausea and vomiting. Initial encounter. EXAM: CHEST  2 VIEW COMPARISON:  Chest radiograph from 01/13/2015 FINDINGS: The lungs are well-aerated. Vascular congestion is noted. Increased interstitial markings raise concern for mild pulmonary edema. No pneumothorax is seen. Scarring is seen at the lung apices. There is new prominence of the right hilum, raising concern for underlying mass. The heart is enlarged. No acute osseous abnormalities are seen. Clips are noted overlying the left chest wall. IMPRESSION: 1. Vascular congestion and cardiomegaly. Increased interstitial markings raise concern for mild pulmonary edema. 2. New prominence of the right hilum, raising concern for underlying mass. CT of the chest would be helpful for further evaluation, when and as deemed clinically appropriate. 3. Scarring at the lung apices. Electronically Signed   By: Garald Balding M.D.   On: 11/11/2015 22:41   Ct Chest Wo Contrast  Result Date: 11/12/2015 CLINICAL DATA:  82 y/o F; history of breast cancer presenting with shortness of breath and abnormal chest radiograph. EXAM: CT CHEST WITHOUT CONTRAST TECHNIQUE: Multidetector CT imaging of the chest was performed following the standard protocol without IV contrast. COMPARISON:  11/11/2015 chest radiograph. 09/15/2013 CT abdomen and pelvis FINDINGS: Cardiovascular: Aortic atherosclerosis with moderate calcifications. The descending thoracic aorta near the  hilum measures 4.2 cm and there is a stable linear calcification traversing the (series 2, image 101) that previously measured 3.9 cm in 2015. Borderline size of main pulmonary artery. Normal heart size. No pericardial effusion. Moderate to severe coronary artery calcification. Mediastinum/Nodes: No enlarged mediastinal or axillary lymph nodes. Thyroid gland, trachea, and esophagus demonstrate no significant findings. Lungs/Pleura: Moderate predominantly paraseptal emphysema greatest in the mid and upper lung zones. Biapical pleural parenchymal scarring. Pulmonary nodules: Right  upper lobe ground-glass 12 mm, series 3: Image 38. Right upper lobe ground-glass 6 mm, 3:57. There are several additional scattered 2-3 mm pulmonary nodules. There are reticular opacities at the lung bases probably representing mild fibrosis. Right basilar platelike atelectasis. No consolidation. Prominence of the right hilum on radiograph corresponds to prominent central pulmonary arteries. Upper Abdomen: Status post cholecystectomy. Nonspecific calcifications right renal hilum. Findings suggestive of intrahepatic biliary ductal dilatation and dilated common bile duct to 20 mm. Left kidney 2 mm stone. Small hiatal hernia. Musculoskeletal: No acute osseous abnormality identified. IMPRESSION: 1. Right hilar prominence that x-ray corresponds to large central pulmonary vessels probably exaggerated by patient's rotation. 2. Descending thoracic aortic aneurysm measuring 4.2 cm, 3.9 cm in 2015 with chronic dissection flap. 3. Moderate predominantly paraseptal emphysema of the lungs. 4. **An incidental finding of potential clinical significance has been found. Multiple ground-glass nodules measuring up to 12 mm. Non-contrast chest CT at 3-6 months is recommended. If nodules persist, subsequent management will be based upon the most suspicious nodule(s). This recommendation follows the consensus statement: Guidelines for Management of Incidental  Pulmonary Nodules Detected on CT Images: From the Fleischner Society 2017; Radiology 2017; 284:228-243.** 5. Dilated common bile duct to 20 mm and findings suggestive of intrahepatic biliary ductal dilatation. Correlation with liver enzymes and possible abdominal imaging is recommended as clinically indicated. Electronically Signed   By: Kristine Garbe M.D.   On: 11/12/2015 01:19   US Abdomen Complete  Result Date: 11/12/2015 CLINICAL DATA:  New onset of jaundice and pancreatitis. History of previous cholecystectomy, episodes of pancreatitis, breast malignancy. EXAM: ABDOMEN ULTRASOUND COMPLETE COMPARISON:  Abdominal and pelvic noncontrast CT scan of September 15, 2013 FINDINGS: Gallbladder: The gallbladder is surgically absent. Common bile duct: Diameter: 1.2 cm. A few intraluminal echoes are observed. Liver: The hepatic echotexture is mildly increased diffusely. There is mild intrahepatic ductal dilation which is not entirely new. No discrete hepatic mass is observed. IVC: No abnormality visualized. Pancreas: The pancreatic tail is partially obscured by bowel gas. The common bile duct at the pancreatic head measures 4 mm in diameter. No discrete pancreatic mass is observed. Spleen: Size and appearance within normal limits. Right Kidney: Length: 11.9 cm. Echogenicity within normal limits. No mass or hydronephrosis visualized. Left Kidney: Length: 11.3 cm. There is a lower pole simple appearing cyst measuring 3.4 x 3.5 by 3.2 cm which has increased in size since the previous study. There is no hydronephrosis nor focal solid mass. Abdominal aorta: No aneurysm visualized. Other findings: There is no ascites. IMPRESSION: 1. No definite sonographic evidence of acute pancreatitis. There is mild dilation of the common bile duct and intrahepatic ducts with a few intraluminal echoes in the CBD which may reflect gravel or stones. Abdominal MRI - MRCP would be a useful next imaging steps. 2. Probable fatty  infiltrative change of the liver. 3. Simple appearing cyst in the lower pole of the left kidney. Electronically Signed   By: David  Martinique M.D.   On: 11/12/2015 09:20   Mr 3d Recon At Scanner  Result Date: 11/12/2015 CLINICAL DATA:  Abnormal LFTs, abnormal CBD on CT chest and abdominal ultrasound EXAM: MRI ABDOMEN WITHOUT AND WITH CONTRAST (INCLUDING MRCP) TECHNIQUE: Multiplanar multisequence MR imaging of the abdomen was performed both before and after the administration of intravenous contrast. Heavily T2-weighted images of the biliary and pancreatic ducts were obtained, and three-dimensional MRCP images were rendered by post processing. CONTRAST:  47m MULTIHANCE GADOBENATE DIMEGLUMINE 529 MG/ML IV SOLN COMPARISON:  Abdominal ultrasound dated 11/12/2015. CT chest dated 11/12/2015. FINDINGS: Motion degraded images. Lower chest: Mild subpleural patchy opacities in the bilateral lower lobes, likely reflecting subpleural reticulation/fibrosis when correlating with recent CT, although superimposed atelectasis is possible. Hepatobiliary: Liver is within normal limits. No suspicious/enhancing hepatic lesions. No hepatic steatosis. Status post cholecystectomy. Dilated common duct, measuring up to 17 mm, with multiple mid/distal CBD stones measuring up to 12 mm. Up to eight CBD stones are suspected. 2-3 cystic duct remnant stones are also suspected, measuring up to 7 mm. Pancreas:  Within normal limits. Spleen:  Within normal limits. Adrenals/Urinary Tract:  Adrenal glands are within normal limits. Dominant anterior left lower pole renal cysts measuring 4.3 cm, with mildly irregular margins (series 4/ image 31), but no enhancing/solid components. Additional small bilateral renal cysts. No hydronephrosis. Stomach/Bowel: Stomach is within normal limits. Visualized bowel is unremarkable. Vascular/Lymphatic:  No evidence of abdominal aortic aneurysm. No suspicious abdominal lymphadenopathy. Other:  No abdominal ascites.  Musculoskeletal: Degenerative changes of the visualized thoracolumbar spine with mild upper lumbar levoscoliosis. IMPRESSION: Motion degraded images. Status post cholecystectomy. Dilated common duct, measuring up to 17 mm, with multiple low mid/distal CBD stones measuring up to 12 mm, as above. 2-3 cystic duct remnant stones are also suspected. Additional ancillary findings as above. Electronically Signed   By: Julian Hy M.D.   On: 11/12/2015 17:23   Mr Abdomen Mrcp Moise Boring Contast  Result Date: 11/12/2015 CLINICAL DATA:  Abnormal LFTs, abnormal CBD on CT chest and abdominal ultrasound EXAM: MRI ABDOMEN WITHOUT AND WITH CONTRAST (INCLUDING MRCP) TECHNIQUE: Multiplanar multisequence MR imaging of the abdomen was performed both before and after the administration of intravenous contrast. Heavily T2-weighted images of the biliary and pancreatic ducts were obtained, and three-dimensional MRCP images were rendered by post processing. CONTRAST:  54m MULTIHANCE GADOBENATE DIMEGLUMINE 529 MG/ML IV SOLN COMPARISON:  Abdominal ultrasound dated 11/12/2015. CT chest dated 11/12/2015. FINDINGS: Motion degraded images. Lower chest: Mild subpleural patchy opacities in the bilateral lower lobes, likely reflecting subpleural reticulation/fibrosis when correlating with recent CT, although superimposed atelectasis is possible. Hepatobiliary: Liver is within normal limits. No suspicious/enhancing hepatic lesions. No hepatic steatosis. Status post cholecystectomy. Dilated common duct, measuring up to 17 mm, with multiple mid/distal CBD stones measuring up to 12 mm. Up to eight CBD stones are suspected. 2-3 cystic duct remnant stones are also suspected, measuring up to 7 mm. Pancreas:  Within normal limits. Spleen:  Within normal limits. Adrenals/Urinary Tract:  Adrenal glands are within normal limits. Dominant anterior left lower pole renal cysts measuring 4.3 cm, with mildly irregular margins (series 4/ image 31), but no  enhancing/solid components. Additional small bilateral renal cysts. No hydronephrosis. Stomach/Bowel: Stomach is within normal limits. Visualized bowel is unremarkable. Vascular/Lymphatic:  No evidence of abdominal aortic aneurysm. No suspicious abdominal lymphadenopathy. Other:  No abdominal ascites. Musculoskeletal: Degenerative changes of the visualized thoracolumbar spine with mild upper lumbar levoscoliosis. IMPRESSION: Motion degraded images. Status post cholecystectomy. Dilated common duct, measuring up to 17 mm, with multiple low mid/distal CBD stones measuring up to 12 mm, as above. 2-3 cystic duct remnant stones are also suspected. Additional ancillary findings as above. Electronically Signed   By: SJulian HyM.D.   On: 11/12/2015 17:23    Medications:  Prior to Admission:  Prescriptions Prior to Admission  Medication Sig Dispense Refill Last Dose  . albuterol (PROVENTIL HFA;VENTOLIN HFA) 108 (90 BASE) MCG/ACT inhaler Inhale 2 puffs into the lungs 2 (two) times daily as needed. Shortness of  Breath   unknown  . albuterol (PROVENTIL) (2.5 MG/3ML) 0.083% nebulizer solution Take 3 mLs (2.5 mg total) by nebulization every 4 (four) hours as needed for wheezing. 75 mL 12 unknown  . atenolol (TENORMIN) 25 MG tablet Take 12.5 mg by mouth every morning.    11/11/2015 at [place]  . escitalopram (LEXAPRO) 5 MG tablet Take 5 mg by mouth daily.     11/11/2015 at Unknown time  . LORazepam (ATIVAN) 0.5 MG tablet Take 0.5 mg by mouth at bedtime.    11/10/2015 at Unknown time  . nitroGLYCERIN (NITROSTAT) 0.4 MG SL tablet Place 0.4 mg under the tongue every 5 (five) minutes as needed for chest pain.   unknown  . pantoprazole (PROTONIX) 40 MG tablet Take 40 mg by mouth daily.    11/11/2015 at Unknown time  . simvastatin (ZOCOR) 80 MG tablet Take 1 tablet by mouth at bedtime.    11/10/2015 at Unknown time  . tiotropium (SPIRIVA) 18 MCG inhalation capsule Place 18 mcg into inhaler and inhale daily.    11/11/2015 at Unknown time  . traMADol (ULTRAM) 50 MG tablet Take 1 tablet by mouth daily as needed for moderate pain.    unknown   Scheduled: . atenolol  12.5 mg Oral q morning - 10a  . chlorhexidine  15 mL Mouth Rinse BID  . ciprofloxacin  400 mg Intravenous Q12H  . citalopram  20 mg Oral Daily  . enoxaparin (LOVENOX) injection  40 mg Subcutaneous Q24H  . famotidine (PEPCID) IV  20 mg Intravenous Q12H  . LORazepam  0.5 mg Oral QHS  . mouth rinse  15 mL Mouth Rinse q12n4p  . metronidazole  500 mg Intravenous Q8H  . ondansetron  8 mg Oral Once  . potassium chloride  20 mEq Oral Daily  . sodium chloride flush  3 mL Intravenous Q12H  . tiotropium  18 mcg Inhalation Daily   Continuous: . sodium chloride 50 mL/hr at 11/12/15 2315   ATF:TDDUKG chloride, acetaminophen **OR** acetaminophen, albuterol, diphenhydrAMINE, labetalol, ondansetron **OR** ondansetron (ZOFRAN) IV, oxyCODONE, promethazine, sodium chloride flush  Assesment: She was admitted with pancreatitis and what appears to be cholangitis. She has stones in the cystic duct. She has sepsis with positive blood cultures. She has been treated with Cipro and Flagyl because she had a severe reaction to cephalosporins. We don't have definite identification yet or sensitivities on her positive blood culture but she is clinically improved so I think it's reasonable to leave her on the current antibiotics.  At baseline she has COPD which is pretty severe. She has chronic hypoxic respiratory failure on home oxygen  She has coronary disease at baseline but is not having any chest pain Principal Problem:   Pancreatitis Active Problems:   COPD (chronic obstructive pulmonary disease) (HCC)   Coronary atherosclerosis   Anxiety   Depression   Abnormal LFTs   Chronic respiratory failure with hypoxia (HCC)   Mass of right lung   Hypertension   Hyperbilirubinemia   Hyponatremia   Abdominal pain   GERD (gastroesophageal reflux  disease)    Plan: Continue current treatments. Apparently plan is for her to have ERCP tomorrow. Continue current antibiotics and she has clinically improved    LOS: 2 days   Clairessa Boulet L 11/13/2015, 8:53 AM

## 2015-11-14 ENCOUNTER — Inpatient Hospital Stay (HOSPITAL_COMMUNITY): Payer: Medicare Other

## 2015-11-14 ENCOUNTER — Encounter (HOSPITAL_COMMUNITY): Payer: Self-pay | Admitting: *Deleted

## 2015-11-14 ENCOUNTER — Inpatient Hospital Stay (HOSPITAL_COMMUNITY): Payer: Medicare Other | Admitting: Anesthesiology

## 2015-11-14 ENCOUNTER — Encounter (HOSPITAL_COMMUNITY)
Admission: EM | Disposition: A | Discharge status: Home / Self Care | Payer: Self-pay | Source: Home / Self Care | Attending: Pulmonary Disease

## 2015-11-14 DIAGNOSIS — K8050 Calculus of bile duct without cholangitis or cholecystitis without obstruction: Secondary | ICD-10-CM | POA: Diagnosis present

## 2015-11-14 DIAGNOSIS — K8031 Calculus of bile duct with cholangitis, unspecified, with obstruction: Secondary | ICD-10-CM | POA: Diagnosis present

## 2015-11-14 HISTORY — PX: ERCP: SHX5425

## 2015-11-14 HISTORY — PX: SPYGLASS CHOLANGIOSCOPY: SHX5441

## 2015-11-14 HISTORY — PX: SPYGLASS LITHOTRIPSY: SHX5537

## 2015-11-14 HISTORY — PX: STONE EXTRACTION WITH BASKET: SHX5318

## 2015-11-14 HISTORY — PX: SPHINCTEROTOMY: SHX5544

## 2015-11-14 LAB — GLUCOSE, CAPILLARY: GLUCOSE-CAPILLARY: 153 mg/dL — AB (ref 65–99)

## 2015-11-14 SURGERY — ERCP, WITH INTERVENTION IF INDICATED
Anesthesia: General

## 2015-11-14 MED ORDER — STERILE WATER FOR IRRIGATION IR SOLN
Status: DC | PRN
  Administered 2015-11-14: 5 mL

## 2015-11-14 MED ORDER — MIDAZOLAM HCL 5 MG/5ML IJ SOLN
INTRAMUSCULAR | Status: DC | PRN
  Administered 2015-11-14: 1 mg via INTRAVENOUS

## 2015-11-14 MED ORDER — DEXAMETHASONE SODIUM PHOSPHATE 4 MG/ML IJ SOLN
INTRAMUSCULAR | Status: AC
  Filled 2015-11-14: qty 1

## 2015-11-14 MED ORDER — NEOSTIGMINE METHYLSULFATE 10 MG/10ML IV SOLN
INTRAVENOUS | Status: DC | PRN
  Administered 2015-11-14: 2 mg via INTRAVENOUS

## 2015-11-14 MED ORDER — LACTATED RINGERS IV SOLN
INTRAVENOUS | Status: DC
  Administered 2015-11-14: 11:00:00 via INTRAVENOUS

## 2015-11-14 MED ORDER — IOPAMIDOL (ISOVUE-300) INJECTION 61%
INTRAVENOUS | Status: AC
  Filled 2015-11-14: qty 100

## 2015-11-14 MED ORDER — ONDANSETRON HCL 4 MG/2ML IJ SOLN
INTRAMUSCULAR | Status: AC
  Filled 2015-11-14: qty 2

## 2015-11-14 MED ORDER — SODIUM CHLORIDE 0.9 % IV SOLN: INTRAVENOUS | Status: DC

## 2015-11-14 MED ORDER — MIDAZOLAM HCL 2 MG/2ML IJ SOLN
INTRAMUSCULAR | Status: AC
  Filled 2015-11-14: qty 2

## 2015-11-14 MED ORDER — SENNOSIDES-DOCUSATE SODIUM 8.6-50 MG PO TABS
1.0000 | ORAL_TABLET | Freq: Two times a day (BID) | ORAL | Status: DC | PRN
  Administered 2015-11-14: 1 via ORAL
  Filled 2015-11-14: qty 1

## 2015-11-14 MED ORDER — GLYCOPYRROLATE 0.2 MG/ML IJ SOLN
INTRAMUSCULAR | Status: AC
  Filled 2015-11-14: qty 1

## 2015-11-14 MED ORDER — FENTANYL CITRATE (PF) 100 MCG/2ML IJ SOLN
INTRAMUSCULAR | Status: AC
  Filled 2015-11-14: qty 2

## 2015-11-14 MED ORDER — GLYCOPYRROLATE 0.2 MG/ML IJ SOLN
INTRAMUSCULAR | Status: DC | PRN
  Administered 2015-11-14: 0.2 mg via INTRAVENOUS

## 2015-11-14 MED ORDER — GLYCOPYRROLATE 0.2 MG/ML IJ SOLN: 0.1000 mg | Freq: Once | INTRAMUSCULAR | Status: DC

## 2015-11-14 MED ORDER — FENTANYL CITRATE (PF) 100 MCG/2ML IJ SOLN
INTRAMUSCULAR | Status: DC | PRN
  Administered 2015-11-14 (×3): 25 ug via INTRAVENOUS

## 2015-11-14 MED ORDER — SCOPOLAMINE 1 MG/3DAYS TD PT72
MEDICATED_PATCH | TRANSDERMAL | Status: AC
Start: 2015-11-14 — End: 2015-11-14
  Filled 2015-11-14: qty 1

## 2015-11-14 MED ORDER — EPHEDRINE SULFATE 50 MG/ML IJ SOLN
INTRAMUSCULAR | Status: AC
  Filled 2015-11-14: qty 1

## 2015-11-14 MED ORDER — ONDANSETRON HCL 4 MG/2ML IJ SOLN
4.0000 mg | Freq: Once | INTRAMUSCULAR | Status: AC
  Administered 2015-11-14: 4 mg via INTRAVENOUS

## 2015-11-14 MED ORDER — ROCURONIUM BROMIDE 100 MG/10ML IV SOLN
INTRAVENOUS | Status: DC | PRN
  Administered 2015-11-14: 30 mg via INTRAVENOUS
  Administered 2015-11-14 (×2): 5 mg via INTRAVENOUS

## 2015-11-14 MED ORDER — MIDAZOLAM HCL 2 MG/2ML IJ SOLN
1.0000 mg | INTRAMUSCULAR | Status: DC | PRN
  Administered 2015-11-14: 2 mg via INTRAVENOUS

## 2015-11-14 MED ORDER — DEXAMETHASONE SODIUM PHOSPHATE 4 MG/ML IJ SOLN
4.0000 mg | Freq: Once | INTRAMUSCULAR | Status: AC
  Administered 2015-11-14: 4 mg via INTRAVENOUS

## 2015-11-14 MED ORDER — ETOMIDATE 2 MG/ML IV SOLN
INTRAVENOUS | Status: DC | PRN
  Administered 2015-11-14: 10 mg via INTRAVENOUS

## 2015-11-14 MED ORDER — GLUCAGON HCL RDNA (DIAGNOSTIC) 1 MG IJ SOLR
INTRAMUSCULAR | Status: AC
  Filled 2015-11-14: qty 2

## 2015-11-14 MED ORDER — SCOPOLAMINE 1 MG/3DAYS TD PT72
1.0000 | MEDICATED_PATCH | Freq: Once | TRANSDERMAL | Status: DC
  Administered 2015-11-14: 1.5 mg via TRANSDERMAL

## 2015-11-14 MED ORDER — SODIUM CHLORIDE 0.9 % IV SOLN
INTRAVENOUS | Status: DC | PRN
  Administered 2015-11-14 (×2): 15 mL

## 2015-11-14 MED ORDER — SODIUM CHLORIDE 0.9 % IJ SOLN
INTRAMUSCULAR | Status: AC
  Filled 2015-11-14: qty 10

## 2015-11-14 SURGICAL SUPPLY — 24 items
BALLN RETRIEVAL 12X15 (BALLOONS) ×2 IMPLANT
BALLN RETRIEVAL 12X15MM (BALLOONS) ×1
BASKET TRAPEZOID 3X6 (MISCELLANEOUS) ×3 IMPLANT
BASKET TRAPEZOID LITHO 2.5X5 (MISCELLANEOUS) IMPLANT
BLOCK BITE 60FR ADLT L/F BLUE (MISCELLANEOUS) IMPLANT
DEVICE INFLATION ENCORE 26 (MISCELLANEOUS) IMPLANT
DEVICE LOCKING W-BIOPSY CAP (MISCELLANEOUS) ×3 IMPLANT
FLOOR PAD 36X40 (MISCELLANEOUS) ×3
GUIDEWIRE HYDRA JAGWIRE .35 (WIRE) IMPLANT
GUIDEWIRE JAG HINI 025X260CM (WIRE) IMPLANT
KIT ENDO PROCEDURE PEN (KITS) ×3 IMPLANT
PAD FLOOR 36X40 (MISCELLANEOUS) ×1 IMPLANT
SCOPE SPY DS DISPOSABLE (MISCELLANEOUS) ×3 IMPLANT
SNARE ROTATE MED OVAL 20MM (MISCELLANEOUS) IMPLANT
SNARE SHORT THROW 13M SML OVAL (MISCELLANEOUS) IMPLANT
SNARE SHORT THROW 27M MED OVAL (MISCELLANEOUS) IMPLANT
SPHINCTEROTOME AUTOTOME .25 (MISCELLANEOUS) IMPLANT
SPHINCTEROTOME HYDRATOME 44 (MISCELLANEOUS) ×6 IMPLANT
SYR 20CC LL (SYRINGE) ×6 IMPLANT
SYSTEM CONTINUOUS INJECTION (MISCELLANEOUS) ×3 IMPLANT
TUBING INSUFFLATOR CO2MPACT (TUBING) ×3 IMPLANT
WALLSTENT METAL COVERED 10X60 (STENTS) IMPLANT
WALLSTENT METAL COVERED 10X80 (STENTS) IMPLANT
WATER STERILE IRR 1000ML POUR (IV SOLUTION) ×3 IMPLANT

## 2015-11-14 NOTE — Anesthesia Procedure Notes (Signed)
Procedure Name: Intubation Date/Time: 11/14/2015 12:00 PM Performed by: Charmaine Downs Pre-anesthesia Checklist: Patient identified, Patient being monitored, Timeout performed, Emergency Drugs available and Suction available Patient Re-evaluated:Patient Re-evaluated prior to inductionOxygen Delivery Method: Circle System Utilized Preoxygenation: Pre-oxygenation with 100% oxygen Intubation Type: IV induction Ventilation: Mask ventilation without difficulty and Oral airway inserted - appropriate to patient size Laryngoscope Size: Mac and 3 Grade View: Grade II Tube type: Oral Tube size: 7.0 mm Number of attempts: 1 Airway Equipment and Method: stylet and Bite block Placement Confirmation: ETT inserted through vocal cords under direct vision,  positive ETCO2 and breath sounds checked- equal and bilateral Secured at: 22 cm Tube secured with: Tape Dental Injury: Teeth and Oropharynx as per pre-operative assessment

## 2015-11-14 NOTE — Interval H&P Note (Signed)
History and Physical Interval Note:  11/14/2015 11:16 AM  Gerald Stabs  has presented today for surgery, with the diagnosis of choledocholithiasis  The various methods of treatment have been discussed with the patient and family. After consideration of risks, benefits and other options for treatment, the patient has consented to  Procedure(s): ENDOSCOPIC RETROGRADE CHOLANGIOPANCREATOGRAPHY (ERCP) (N/A) SPYGLASS LITHOTRIPSY (N/A) HOLMIUM LASER APPLICATION (N/A) as a surgical intervention .  The patient's history has been reviewed, patient examined, no change in status, stable for surgery.  I have reviewed the patient's chart and labs.  Questions were answered to the patient's satisfaction.     Manus Rudd  Patient seen in short stay. Clinically, doing much better. Abdominal pain has subsided. ERCP with biliary decompression/stone removal planned for today per plan.  The risks, benefits, limitations, alternatives, and mponderable have been reviewed with the patient. I specifically discussed a1 in 10 chance of pancreatitis, reaction to medications, bleeding, perforation and the possibility of a failed ERCP. Potential for sphincterotomy and stent placement also reviewed. Questions have been answered. Both patient and daughter and agreement.

## 2015-11-14 NOTE — H&P (View-Only) (Signed)
Discussed with Dr. Gala Romney. Given patient's IVP dye allergy she will be premedicated with Benadryl 50 mg orally 13, 7, one hour prior to procedure which is scheduled for 1135 tomorrow morning. She will receive Benadryl 50 mg by mouth one hour before procedure tomorrow.  Discussed with patient who is agreeable. Discussed with patient's nurse, Jenny Reichmann. She will pass on information to night nurse.  Discussed with Abbie Sons, she reports that the patient is on schedule for 1135 am tomorrow morning.

## 2015-11-14 NOTE — Op Note (Signed)
[name], [name]                  ACCOUNT NO.:  [id number]  MEDICAL RECORD NO.:  [id number]  LOCATION:  [facility]                         FACILITY:  [facility]  PHYSICIAN:  R. Garfield Cornea, MD [place]:  [birth year]  DATE OF PROCEDURE:  11/14/2015 DATE OF DISCHARGE:                              OPERATIVE REPORT   PROCEDURE:  ERCP with biliary sphincterotomy, balloon and basket stone extraction and diagnostic cholangioscopy.  INDICATIONS FOR PROCEDURE:  An 82 year old lady admitted to the hospital with biliary pancreatitis.  Pancreatitis has resolved.  She is jaundiced.  MRCP demonstrated multiple stones in her biliary tree.  The gallbladder removed many years ago.  ERCP now being done.  Risks, benefits, limitations, alternatives, imponderables have been discussed with the patient and patient's daughter at length.  Risks, all parties agreeable.  Please see the documentation medical record.  PROCEDURE NOTE:  O2 saturation, blood pressure, pulse, respirations monitored throughout the entirety of the procedure.  General endotracheal anesthesia administered by Dr. Duwayne Heck and associates. The patient was premedicated per protocol for IV contrast allergy with prednisone and diphenhydramine.  IV Cipro ongoing.  INSTRUMENT:  Pentax video chip system.  FINDINGS:  Examination of the distal esophagus, stomach, and duodenum revealed no abnormalities.  The ampulla of Vater was bulging, readily identified on the medial wall, the 2nd portion of the duodenum.  The scope was pulled back to the short position, 55 cm from incisors and a scout film was taken.  Using the 0.044 [employer] autotome, the ampulla was approached using guidewire palpation, I almost immediately achieved deep biliary cannulation.  Cholangiogram was performed.  The entire biliary tree, intra and extrahepatic segments, markedly dilated; multiple intraluminal filling defects in the common hepatic and bile  ducts.  The safety wire was kept deep into the biliary tree.  Sphincterotome was pulled back across the ampullary orifice and utilizing the Erbe unit, 1 cm sphincterotomy was performed at the 12 o'clock position.  This was done without difficulty or apparent complication.  With this maneuver, cholesterol stone fragments started coming out of the duct spontaneously.  I took a graduated extractor balloon and placed it into the mid common bile duct, inflated to accommodate the size of the duct which was dilated.  Balloon occlusion cholangiogram was obtained which yielded multiple pigment and cholesterol gallstones.  This maneuver was repeated 3 more times, and stones kept coming through the ampullary orifice.  We count at least 25 stones.  There were more delivered through the sphincterotomy with quite a bit of granular and mushy debris.  No further stones were seen radiographically.  I set up the spy glass cholangioscope and entered the bile duct - went up to the confluence and went into the cystic duct stump a short distance.  There was at least 1 large cholesterol stone near the  the confluence.  I withdrew the cholangioscope and trawled the duct with the basket.  When I injected through the basket, I was able to see the remaining stone fluoroscopically.  I engage the stone with the basket and retrieved it.  Additional stone debris was delivered through the ampullary orifice.  No further stones were seen.  I subsequently did 3 more balloon pull-through cholangiograms and this actually yielded additional small stone fragments and debris.  The last pull through yielded only a small amount of gravel.  The biliary tree appeared to drain very well with these maneuvers.  The pancreatic duct was not injected or manipulated.  Fluoroscopy time 10 minutes 15 seconds.  IMPRESSION:  Choledocholithiasis  -  status post biliary sphincterotomy with balloon and basket stone extraction and  cholangioscopy.  RECOMMENDATIONS: 1. Advance diet as tolerated.  Check hepatic profile tomorrow morning. 2. Hopefully, the patient could be discharged within the next 24     hours.     Bridgette Habermann, MD FACP [facility]     RMR/MEDQ  D:  11/14/2015  T:  11/14/2015  Job:  [id number]  cc:   Oneida Alar, Dr.  Luan Pulling, Dr.

## 2015-11-14 NOTE — Transfer of Care (Signed)
Immediate Anesthesia Transfer of Care Note  Patient: [name]  Procedure(s) Performed: Procedure(s): ENDOSCOPIC RETROGRADE CHOLANGIOPANCREATOGRAPHY (ERCP) (N/A) SPYGLASS LITHOTRIPSY (N/A) SPHINCTEROTOMY (N/A) SPYGLASS CHOLANGIOSCOPY (N/A) STONE EXTRACTION WITH BASKET AND BALLOON (N/A)  Patient Location: PACU  Anesthesia Type:General  Level of Consciousness: awake and patient cooperative  Airway & Oxygen Therapy: Patient Spontanous Breathing and Patient connected to face mask oxygen  Post-op Assessment: Report given to RN, Post -op Vital signs reviewed and stable and Patient moving all extremities  Post vital signs: Reviewed and stable  Last Vitals:  Vitals:   11/14/15 1135 11/14/15 1140  BP: 125/63 128/68  Pulse:    Resp: (!) 21 19  Temp:      Last Pain:  Vitals:   11/14/15 1053  TempSrc: Oral  PainSc: 0-No pain      Patients Stated Pain Goal: 5 (50/53/97 6734)  Complications: No apparent anesthesia complications

## 2015-11-14 NOTE — Anesthesia Postprocedure Evaluation (Signed)
Anesthesia Post Note  Patient: [name]  Procedure(s) Performed: Procedure(s) (LRB): ENDOSCOPIC RETROGRADE CHOLANGIOPANCREATOGRAPHY (ERCP) (N/A) SPYGLASS LITHOTRIPSY (N/A) SPHINCTEROTOMY (N/A) SPYGLASS CHOLANGIOSCOPY (N/A) STONE EXTRACTION WITH BASKET AND BALLOON (N/A)  Patient location during evaluation: PACU Anesthesia Type: General Level of consciousness: awake and patient cooperative Pain management: pain level controlled Vital Signs Assessment: post-procedure vital signs reviewed and stable Respiratory status: spontaneous breathing, nonlabored ventilation, respiratory function stable and patient connected to face mask oxygen Cardiovascular status: blood pressure returned to baseline and stable Postop Assessment: no signs of nausea or vomiting Anesthetic complications: no    Last Vitals:  Vitals:   11/14/15 1330 11/14/15 1345  BP: (!) 146/68 (!) 151/67  Pulse: 80 81  Resp: 12 16  Temp:      Last Pain:  Vitals:   11/14/15 1053  TempSrc: Oral  PainSc: 0-No pain                 [name] J

## 2015-11-14 NOTE — Progress Notes (Signed)
Subjective: She says she feels better. Less abdominal pain. Her breathing is doing okay. No fever. She is scheduled for ERCP today. No nausea or vomiting. No cough. No chest pain.  Objective: Vital signs in last 24 hours: Temp:  [98 F (36.7 C)-98.4 F (36.9 C)] 98 F (36.7 C) (11/02 0526) Pulse Rate:  [66-70] 70 (11/02 0526) Resp:  [18-20] 20 (11/02 0526) BP: (100-107)/(52-63) 100/52 (11/02 0526) SpO2:  [94 %-98 %] 94 % (11/02 0815) Weight:  [64.1 kg (141 lb 4.8 oz)] 64.1 kg (141 lb 4.8 oz) (11/02 0526) Weight change: -1.406 kg (-3 lb 1.6 oz) Last BM Date: 11/11/15  Intake/Output from previous day: 11/01 0701 - 11/02 0700 In: 720 [P.O.:720] Out: 802 [Urine:802]  PHYSICAL EXAM General appearance: alert, cooperative and mild distress Resp: clear to auscultation bilaterally Cardio: regular rate and rhythm, S1, S2 normal, no murmur, click, rub or gallop GI: She still has some tenderness in her abdomen. Extremities: extremities normal, atraumatic, no cyanosis or edema Skin warm and dry. Less jaundice. Mucous membranes are moist  Lab Results:  Results for orders placed or performed during the hospital encounter of 11/11/15 (from the past 48 hour(s))  Comprehensive metabolic panel     Status: Abnormal   Collection Time: 11/13/15  6:07 AM  Result Value Ref Range   Sodium 140 135 - 145 mmol/L   Potassium 4.2 3.5 - 5.1 mmol/L    Comment: DELTA CHECK NOTED   Chloride 106 101 - 111 mmol/L   CO2 31 22 - 32 mmol/L   Glucose, Bld 99 65 - 99 mg/dL   BUN 12 6 - 20 mg/dL   Creatinine, Ser 0.49 0.44 - 1.00 mg/dL   Calcium 9.2 8.9 - 10.3 mg/dL   Total Protein 5.5 (L) 6.5 - 8.1 g/dL   Albumin 2.6 (L) 3.5 - 5.0 g/dL   AST 57 (H) 15 - 41 U/L   ALT 101 (H) 14 - 54 U/L   Alkaline Phosphatase 140 (H) 38 - 126 U/L   Total Bilirubin 2.7 (H) 0.3 - 1.2 mg/dL   GFR calc non Af Amer >60 >60 mL/min   GFR calc Af Amer >60 >60 mL/min    Comment: (NOTE) The eGFR has been calculated using the CKD  EPI equation. This calculation has not been validated in all clinical situations. eGFR's persistently <60 mL/min signify possible Chronic Kidney Disease.    Anion gap 3 (L) 5 - 15  CBC with Differential/Platelet     Status: Abnormal   Collection Time: 11/13/15  6:07 AM  Result Value Ref Range   WBC 7.4 4.0 - 10.5 K/uL   RBC 3.54 (L) 3.87 - 5.11 MIL/uL   Hemoglobin 11.2 (L) 12.0 - 15.0 g/dL   HCT 36.2 36.0 - 46.0 %   MCV 102.3 (H) 78.0 - 100.0 fL   MCH 31.6 26.0 - 34.0 pg   MCHC 30.9 30.0 - 36.0 g/dL   RDW 13.6 11.5 - 15.5 %   Platelets 142 (L) 150 - 400 K/uL   Neutrophils Relative % 64 %   Neutro Abs 4.8 1.7 - 7.7 K/uL   Lymphocytes Relative 18 %   Lymphs Abs 1.3 0.7 - 4.0 K/uL   Monocytes Relative 14 %   Monocytes Absolute 1.1 (H) 0.1 - 1.0 K/uL   Eosinophils Relative 3 %   Eosinophils Absolute 0.2 0.0 - 0.7 K/uL   Basophils Relative 1 %   Basophils Absolute 0.0 0.0 - 0.1 K/uL  Lipase, blood  Status: None   Collection Time: 11/13/15  6:07 AM  Result Value Ref Range   Lipase 24 11 - 51 U/L  Glucose, capillary     Status: Abnormal   Collection Time: 11/13/15  7:25 AM  Result Value Ref Range   Glucose-Capillary 100 (H) 65 - 99 mg/dL   Comment 1 Notify RN    Comment 2 Document in Chart   Glucose, capillary     Status: Abnormal   Collection Time: 11/14/15  7:44 AM  Result Value Ref Range   Glucose-Capillary 153 (H) 65 - 99 mg/dL   Comment 1 Notify RN    Comment 2 Document in Chart     ABGS No results for input(s): PHART, PO2ART, TCO2, HCO3 in the last 72 hours.  Invalid input(s): PCO2 CULTURES Recent Results (from the past 240 hour(s))  Urine culture     Status: Abnormal   Collection Time: 11/11/15  9:15 PM  Result Value Ref Range Status   Specimen Description URINE, CLEAN CATCH  Final   Special Requests NONE  Final   Culture (A)  Final    <10,000 COLONIES/mL INSIGNIFICANT GROWTH Performed at [facility]    Report Status 11/13/2015 FINAL  Final   Culture, blood (Routine X 2) w Reflex to ID Panel     Status: None (Preliminary result)   Collection Time: 11/12/15 12:30 AM  Result Value Ref Range Status   Specimen Description BLOOD RIGHT ANTECUBITAL  Final   Special Requests BOTTLES DRAWN AEROBIC AND ANAEROBIC 5CC EACH  Final   Culture  Setup Time   Final    GRAM NEGATIVE RODS recovered from aerobic and anaerobic bottles Gram Stain Report Called to,Read Back By and Verified With: Dickerson,M at 1700 by Flynt,H 11/12/15 Performed at [facility]  Final   Report Status PENDING  Incomplete  Blood Culture ID Panel (Reflexed)     Status: Abnormal   Collection Time: 11/12/15 12:30 AM  Result Value Ref Range Status   Enterococcus species NOT DETECTED NOT DETECTED Final   Vancomycin resistance NOT DETECTED NOT DETECTED Final   Listeria monocytogenes NOT DETECTED NOT DETECTED Final   Staphylococcus species NOT DETECTED NOT DETECTED Final   Staphylococcus aureus NOT DETECTED NOT DETECTED Final   Streptococcus species NOT DETECTED NOT DETECTED Final   Streptococcus agalactiae NOT DETECTED NOT DETECTED Final   Streptococcus pneumoniae NOT DETECTED NOT DETECTED Final   Streptococcus pyogenes NOT DETECTED NOT DETECTED Final   Acinetobacter baumannii NOT DETECTED NOT DETECTED Final   Enterobacteriaceae species DETECTED (A) NOT DETECTED Final    Comment: CRITICAL RESULT CALLED TO, READ BACK BY AND VERIFIED WITH: CHRISTY THOMAS,RN @0455  11/13/15 MKELLY,MLT    Enterobacter cloacae complex NOT DETECTED NOT DETECTED Final   Escherichia coli NOT DETECTED NOT DETECTED Final   Klebsiella oxytoca DETECTED (A) NOT DETECTED Final    Comment: CRITICAL RESULT CALLED TO, READ BACK BY AND VERIFIED WITH: CHRISTY THOAMS,RN @0455  11/13/15 MKELLY,MLT    Klebsiella pneumoniae DETECTED (A) NOT DETECTED Final    Comment: CRITICAL RESULT CALLED TO, READ BACK BY AND VERIFIED WITH: CHRISTY THOMAS,RN @0455  11/13/15 MKELLY,MLT     Proteus species NOT DETECTED NOT DETECTED Final   Serratia marcescens NOT DETECTED NOT DETECTED Final   Carbapenem resistance NOT DETECTED NOT DETECTED Final   Haemophilus influenzae NOT DETECTED NOT DETECTED Final   Neisseria meningitidis NOT DETECTED NOT DETECTED Final   Pseudomonas aeruginosa NOT DETECTED NOT DETECTED Final  Candida albicans NOT DETECTED NOT DETECTED Final   Candida glabrata NOT DETECTED NOT DETECTED Final   Candida krusei NOT DETECTED NOT DETECTED Final   Candida parapsilosis NOT DETECTED NOT DETECTED Final   Candida tropicalis NOT DETECTED NOT DETECTED Final    Comment: Performed at [facility]  Culture, blood (Routine X 2) w Reflex to ID Panel     Status: None (Preliminary result)   Collection Time: 11/12/15 12:41 AM  Result Value Ref Range Status   Specimen Description BLOOD RIGHT HAND  Final   Special Requests BOTTLES DRAWN AEROBIC AND ANAEROBIC 4CC EACH  Final   Culture  Setup Time   Final    GRAM NEGATIVE RODS RECOVERED FROM AEROBIC AND ANAEROBIC BOTTLES Gram Stain Report Called to,Read Back By and Verified With: DICKERSON,M AT 1700 BY FLYNT,H ON 11/12/15 Performed at [facility]  Final   Report Status PENDING  Incomplete   Studies/Results: US Abdomen Complete  Result Date: 11/12/2015 CLINICAL DATA:  New onset of jaundice and pancreatitis. History of previous cholecystectomy, episodes of pancreatitis, breast malignancy. EXAM: ABDOMEN ULTRASOUND COMPLETE COMPARISON:  Abdominal and pelvic noncontrast CT scan of September 15, 2013 FINDINGS: Gallbladder: The gallbladder is surgically absent. Common bile duct: Diameter: 1.2 cm. A few intraluminal echoes are observed. Liver: The hepatic echotexture is mildly increased diffusely. There is mild intrahepatic ductal dilation which is not entirely new. No discrete hepatic mass is observed. IVC: No abnormality visualized. Pancreas: The pancreatic tail is partially obscured  by bowel gas. The common bile duct at the pancreatic head measures 4 mm in diameter. No discrete pancreatic mass is observed. Spleen: Size and appearance within normal limits. Right Kidney: Length: 11.9 cm. Echogenicity within normal limits. No mass or hydronephrosis visualized. Left Kidney: Length: 11.3 cm. There is a lower pole simple appearing cyst measuring 3.4 x 3.5 by 3.2 cm which has increased in size since the previous study. There is no hydronephrosis nor focal solid mass. Abdominal aorta: No aneurysm visualized. Other findings: There is no ascites. IMPRESSION: 1. No definite sonographic evidence of acute pancreatitis. There is mild dilation of the common bile duct and intrahepatic ducts with a few intraluminal echoes in the CBD which may reflect gravel or stones. Abdominal MRI - MRCP would be a useful next imaging steps. 2. Probable fatty infiltrative change of the liver. 3. Simple appearing cyst in the lower pole of the left kidney. Electronically Signed   By: David  Martinique M.D.   On: 11/12/2015 09:20   Mr 3d Recon At Scanner  Result Date: 11/12/2015 CLINICAL DATA:  Abnormal LFTs, abnormal CBD on CT chest and abdominal ultrasound EXAM: MRI ABDOMEN WITHOUT AND WITH CONTRAST (INCLUDING MRCP) TECHNIQUE: Multiplanar multisequence MR imaging of the abdomen was performed both before and after the administration of intravenous contrast. Heavily T2-weighted images of the biliary and pancreatic ducts were obtained, and three-dimensional MRCP images were rendered by post processing. CONTRAST:  53m MULTIHANCE GADOBENATE DIMEGLUMINE 529 MG/ML IV SOLN COMPARISON:  Abdominal ultrasound dated 11/12/2015. CT chest dated 11/12/2015. FINDINGS: Motion degraded images. Lower chest: Mild subpleural patchy opacities in the bilateral lower lobes, likely reflecting subpleural reticulation/fibrosis when correlating with recent CT, although superimposed atelectasis is possible. Hepatobiliary: Liver is within normal limits.  No suspicious/enhancing hepatic lesions. No hepatic steatosis. Status post cholecystectomy. Dilated common duct, measuring up to 17 mm, with multiple mid/distal CBD stones measuring up to 12 mm. Up to eight CBD stones are suspected. 2-3  cystic duct remnant stones are also suspected, measuring up to 7 mm. Pancreas:  Within normal limits. Spleen:  Within normal limits. Adrenals/Urinary Tract:  Adrenal glands are within normal limits. Dominant anterior left lower pole renal cysts measuring 4.3 cm, with mildly irregular margins (series 4/ image 31), but no enhancing/solid components. Additional small bilateral renal cysts. No hydronephrosis. Stomach/Bowel: Stomach is within normal limits. Visualized bowel is unremarkable. Vascular/Lymphatic:  No evidence of abdominal aortic aneurysm. No suspicious abdominal lymphadenopathy. Other:  No abdominal ascites. Musculoskeletal: Degenerative changes of the visualized thoracolumbar spine with mild upper lumbar levoscoliosis. IMPRESSION: Motion degraded images. Status post cholecystectomy. Dilated common duct, measuring up to 17 mm, with multiple low mid/distal CBD stones measuring up to 12 mm, as above. 2-3 cystic duct remnant stones are also suspected. Additional ancillary findings as above. Electronically Signed   By: Julian Hy M.D.   On: 11/12/2015 17:23   Mr Abdomen Mrcp Moise Boring Contast  Result Date: 11/12/2015 CLINICAL DATA:  Abnormal LFTs, abnormal CBD on CT chest and abdominal ultrasound EXAM: MRI ABDOMEN WITHOUT AND WITH CONTRAST (INCLUDING MRCP) TECHNIQUE: Multiplanar multisequence MR imaging of the abdomen was performed both before and after the administration of intravenous contrast. Heavily T2-weighted images of the biliary and pancreatic ducts were obtained, and three-dimensional MRCP images were rendered by post processing. CONTRAST:  55m MULTIHANCE GADOBENATE DIMEGLUMINE 529 MG/ML IV SOLN COMPARISON:  Abdominal ultrasound dated 11/12/2015. CT chest dated  11/12/2015. FINDINGS: Motion degraded images. Lower chest: Mild subpleural patchy opacities in the bilateral lower lobes, likely reflecting subpleural reticulation/fibrosis when correlating with recent CT, although superimposed atelectasis is possible. Hepatobiliary: Liver is within normal limits. No suspicious/enhancing hepatic lesions. No hepatic steatosis. Status post cholecystectomy. Dilated common duct, measuring up to 17 mm, with multiple mid/distal CBD stones measuring up to 12 mm. Up to eight CBD stones are suspected. 2-3 cystic duct remnant stones are also suspected, measuring up to 7 mm. Pancreas:  Within normal limits. Spleen:  Within normal limits. Adrenals/Urinary Tract:  Adrenal glands are within normal limits. Dominant anterior left lower pole renal cysts measuring 4.3 cm, with mildly irregular margins (series 4/ image 31), but no enhancing/solid components. Additional small bilateral renal cysts. No hydronephrosis. Stomach/Bowel: Stomach is within normal limits. Visualized bowel is unremarkable. Vascular/Lymphatic:  No evidence of abdominal aortic aneurysm. No suspicious abdominal lymphadenopathy. Other:  No abdominal ascites. Musculoskeletal: Degenerative changes of the visualized thoracolumbar spine with mild upper lumbar levoscoliosis. IMPRESSION: Motion degraded images. Status post cholecystectomy. Dilated common duct, measuring up to 17 mm, with multiple low mid/distal CBD stones measuring up to 12 mm, as above. 2-3 cystic duct remnant stones are also suspected. Additional ancillary findings as above. Electronically Signed   By: SJulian HyM.D.   On: 11/12/2015 17:23    Medications:  Prior to Admission:  Prescriptions Prior to Admission  Medication Sig Dispense Refill Last Dose  . albuterol (PROVENTIL HFA;VENTOLIN HFA) 108 (90 BASE) MCG/ACT inhaler Inhale 2 puffs into the lungs 2 (two) times daily as needed. Shortness of Breath   unknown  . albuterol (PROVENTIL) (2.5 MG/3ML)  0.083% nebulizer solution Take 3 mLs (2.5 mg total) by nebulization every 4 (four) hours as needed for wheezing. 75 mL 12 unknown  . atenolol (TENORMIN) 25 MG tablet Take 12.5 mg by mouth every morning.    11/11/2015 at [facility] . escitalopram (LEXAPRO) 5 MG tablet Take 5 mg by mouth daily.     11/11/2015 at Unknown time  . LORazepam (ATIVAN) 0.5  MG tablet Take 0.5 mg by mouth at bedtime.    11/10/2015 at Unknown time  . nitroGLYCERIN (NITROSTAT) 0.4 MG SL tablet Place 0.4 mg under the tongue every 5 (five) minutes as needed for chest pain.   unknown  . pantoprazole (PROTONIX) 40 MG tablet Take 40 mg by mouth daily.    11/11/2015 at Unknown time  . simvastatin (ZOCOR) 80 MG tablet Take 1 tablet by mouth at bedtime.    11/10/2015 at Unknown time  . tiotropium (SPIRIVA) 18 MCG inhalation capsule Place 18 mcg into inhaler and inhale daily.   11/11/2015 at Unknown time  . traMADol (ULTRAM) 50 MG tablet Take 1 tablet by mouth daily as needed for moderate pain.    unknown   Scheduled: . atenolol  12.5 mg Oral q morning - 10a  . chlorhexidine  15 mL Mouth Rinse BID  . ciprofloxacin  400 mg Intravenous Q12H  . citalopram  20 mg Oral Daily  . diphenhydrAMINE  50 mg Oral Once  . famotidine (PEPCID) IV  20 mg Intravenous Q12H  . LORazepam  0.5 mg Oral QHS  . mouth rinse  15 mL Mouth Rinse q12n4p  . metronidazole  500 mg Intravenous Q8H  . ondansetron  8 mg Oral Once  . potassium chloride  20 mEq Oral Daily  . predniSONE  50 mg Oral Once  . sodium chloride flush  3 mL Intravenous Q12H  . tiotropium  18 mcg Inhalation Daily   Continuous: . sodium chloride 50 mL/hr at 11/13/15 1557   LZJ:QBHALP chloride, acetaminophen **OR** acetaminophen, albuterol, diphenhydrAMINE, labetalol, ondansetron **OR** ondansetron (ZOFRAN) IV, oxyCODONE, promethazine, sodium chloride flush  Assesment: She was admitted with pancreatitis and abnormal liver function testing and elevated bilirubin. She is on antibiotics. She's  been treated for cholangitis. She has stones in the common bile duct. She looks significantly better now.  At baseline she has pretty severe COPD and chronic hypoxic respiratory failure but both of these are stable  At baseline she has coronary artery occlusive disease but that is doing okay. Principal Problem:   Pancreatitis Active Problems:   COPD (chronic obstructive pulmonary disease) (HCC)   Coronary atherosclerosis   Anxiety   Depression   Abnormal LFTs   Chronic respiratory failure with hypoxia (HCC)   Mass of right lung   Hypertension   Hyperbilirubinemia   Hyponatremia   Abdominal pain   GERD (gastroesophageal reflux disease)   Calculus of bile duct with cholangitis and obstruction    Plan: Continue antibiotics. Her white blood count has come back to normal as of yesterday. Continue other treatments. For ERCP later today    LOS: 3 days   Shaeleigh Graw L 11/14/2015, 8:45 AM

## 2015-11-15 DIAGNOSIS — A419 Sepsis, unspecified organism: Secondary | ICD-10-CM | POA: Diagnosis present

## 2015-11-15 DIAGNOSIS — E871 Hypo-osmolality and hyponatremia: Secondary | ICD-10-CM

## 2015-11-15 DIAGNOSIS — R7881 Bacteremia: Secondary | ICD-10-CM | POA: Diagnosis present

## 2015-11-15 DIAGNOSIS — B961 Klebsiella pneumoniae [K. pneumoniae] as the cause of diseases classified elsewhere: Secondary | ICD-10-CM | POA: Diagnosis present

## 2015-11-15 DIAGNOSIS — K8035 Calculus of bile duct with chronic cholangitis with obstruction: Secondary | ICD-10-CM

## 2015-11-15 DIAGNOSIS — R17 Unspecified jaundice: Secondary | ICD-10-CM

## 2015-11-15 DIAGNOSIS — K8050 Calculus of bile duct without cholangitis or cholecystitis without obstruction: Secondary | ICD-10-CM

## 2015-11-15 DIAGNOSIS — R748 Abnormal levels of other serum enzymes: Secondary | ICD-10-CM

## 2015-11-15 DIAGNOSIS — R112 Nausea with vomiting, unspecified: Secondary | ICD-10-CM

## 2015-11-15 DIAGNOSIS — R1013 Epigastric pain: Secondary | ICD-10-CM

## 2015-11-15 LAB — CULTURE, BLOOD (ROUTINE X 2)

## 2015-11-15 LAB — HEPATIC FUNCTION PANEL
ALT: 57 U/L — ABNORMAL HIGH (ref 14–54)
AST: 29 U/L (ref 15–41)
Albumin: 2.6 g/dL — ABNORMAL LOW (ref 3.5–5.0)
Alkaline Phosphatase: 151 U/L — ABNORMAL HIGH (ref 38–126)
BILIRUBIN DIRECT: 0.7 mg/dL — AB (ref 0.1–0.5)
BILIRUBIN INDIRECT: 0.8 mg/dL (ref 0.3–0.9)
Total Bilirubin: 1.5 mg/dL — ABNORMAL HIGH (ref 0.3–1.2)
Total Protein: 5.5 g/dL — ABNORMAL LOW (ref 6.5–8.1)

## 2015-11-15 LAB — GLUCOSE, CAPILLARY: GLUCOSE-CAPILLARY: 123 mg/dL — AB (ref 65–99)

## 2015-11-15 MED ORDER — CIPROFLOXACIN HCL 500 MG PO TABS: 500.0000 mg | ORAL_TABLET | Freq: Two times a day (BID) | ORAL | 0 refills | Status: DC

## 2015-11-15 NOTE — Progress Notes (Signed)
Subjective: She had ERCP and stone extraction yesterday. She feels well. She is sitting up eating breakfast with no nausea and no abdominal pain. Her shortness of breath is unchanged. No diarrhea. No chest pain.  Objective: Vital signs in last 24 hours: Temp:  [97.5 F (36.4 C)-98.4 F (36.9 C)] 97.5 F (36.4 C) (11/03 0532) Pulse Rate:  [60-89] 66 (11/03 0532) Resp:  [12-28] 18 (11/03 0532) BP: (103-151)/(53-73) 111/53 (11/03 0532) SpO2:  [91 %-99 %] 95 % (11/03 0852) Weight:  [65.7 kg (144 lb 14.4 oz)] 65.7 kg (144 lb 14.4 oz) (11/03 0532) Weight change: 1.633 kg (3 lb 9.6 oz) Last BM Date: 11/11/15  Intake/Output from previous day: 11/02 0701 - 11/03 0700 In: 800 [I.V.:800] Out: 700 [Urine:700]  PHYSICAL EXAM General appearance: alert, cooperative and no distress Resp: clear to auscultation bilaterally Cardio: regular rate and rhythm, S1, S2 normal, no murmur, click, rub or gallop GI: soft, non-tender; bowel sounds normal; no masses,  no organomegaly Extremities: extremities normal, atraumatic, no cyanosis or edema No longer jaundiced. Mucous membranes are moist.  Lab Results:  Results for orders placed or performed during the hospital encounter of 11/11/15 (from the past 48 hour(s))  Glucose, capillary     Status: Abnormal   Collection Time: 11/14/15  7:44 AM  Result Value Ref Range   Glucose-Capillary 153 (H) 65 - 99 mg/dL   Comment 1 Notify RN    Comment 2 Document in Chart   Hepatic function panel     Status: Abnormal   Collection Time: 11/15/15  6:45 AM  Result Value Ref Range   Total Protein 5.5 (L) 6.5 - 8.1 g/dL   Albumin 2.6 (L) 3.5 - 5.0 g/dL   AST 29 15 - 41 U/L   ALT 57 (H) 14 - 54 U/L   Alkaline Phosphatase 151 (H) 38 - 126 U/L   Total Bilirubin 1.5 (H) 0.3 - 1.2 mg/dL   Bilirubin, Direct 0.7 (H) 0.1 - 0.5 mg/dL   Indirect Bilirubin 0.8 0.3 - 0.9 mg/dL  Glucose, capillary     Status: Abnormal   Collection Time: 11/15/15  7:39 AM  Result Value Ref  Range   Glucose-Capillary 123 (H) 65 - 99 mg/dL    ABGS No results for input(s): PHART, PO2ART, TCO2, HCO3 in the last 72 hours.  Invalid input(s): PCO2 CULTURES Recent Results (from the past 240 hour(s))  Urine culture     Status: Abnormal   Collection Time: 11/11/15  9:15 PM  Result Value Ref Range Status   Specimen Description URINE, CLEAN CATCH  Final   Special Requests NONE  Final   Culture (A)  Final    <10,000 COLONIES/mL INSIGNIFICANT GROWTH Performed at [facility]    Report Status 11/13/2015 FINAL  Final  Culture, blood (Routine X 2) w Reflex to ID Panel     Status: Abnormal   Collection Time: 11/12/15 12:30 AM  Result Value Ref Range Status   Specimen Description BLOOD RIGHT ANTECUBITAL  Final   Special Requests BOTTLES DRAWN AEROBIC AND ANAEROBIC 5CC EACH  Final   Culture  Setup Time   Final    GRAM NEGATIVE RODS recovered from aerobic and anaerobic bottles Gram Stain Report Called to,Read Back By and Verified With: Dickerson,M at 1700 by Flynt,H 11/12/15 Performed at [place]  (A)  Final   Report Status 11/15/2015 FINAL  Final   Organism ID, Bacteria KLEBSIELLA PNEUMONIAE  Final  Organism ID, Bacteria KLEBSIELLA OXYTOCA  Final      Susceptibility   Klebsiella oxytoca - MIC*    AMPICILLIN >=32 RESISTANT Resistant     CEFAZOLIN 16 SENSITIVE Sensitive     CEFEPIME <=1 SENSITIVE Sensitive     CEFTAZIDIME <=1 SENSITIVE Sensitive     CEFTRIAXONE <=1 SENSITIVE Sensitive     CIPROFLOXACIN <=0.25 SENSITIVE Sensitive     GENTAMICIN <=1 SENSITIVE Sensitive     IMIPENEM <=0.25 SENSITIVE Sensitive     TRIMETH/SULFA <=20 SENSITIVE Sensitive     AMPICILLIN/SULBACTAM 8 SENSITIVE Sensitive     PIP/TAZO <=4 SENSITIVE Sensitive     Extended ESBL NEGATIVE Sensitive     * KLEBSIELLA OXYTOCA   Klebsiella pneumoniae - MIC*    AMPICILLIN >=32 RESISTANT Resistant     CEFAZOLIN <=4 SENSITIVE Sensitive      CEFEPIME <=1 SENSITIVE Sensitive     CEFTAZIDIME <=1 SENSITIVE Sensitive     CEFTRIAXONE <=1 SENSITIVE Sensitive     CIPROFLOXACIN <=0.25 SENSITIVE Sensitive     GENTAMICIN <=1 SENSITIVE Sensitive     IMIPENEM <=0.25 SENSITIVE Sensitive     TRIMETH/SULFA <=20 SENSITIVE Sensitive     AMPICILLIN/SULBACTAM 4 SENSITIVE Sensitive     PIP/TAZO <=4 SENSITIVE Sensitive     Extended ESBL NEGATIVE Sensitive     * KLEBSIELLA PNEUMONIAE  Blood Culture ID Panel (Reflexed)     Status: Abnormal   Collection Time: 11/12/15 12:30 AM  Result Value Ref Range Status   Enterococcus species NOT DETECTED NOT DETECTED Final   Vancomycin resistance NOT DETECTED NOT DETECTED Final   Listeria monocytogenes NOT DETECTED NOT DETECTED Final   Staphylococcus species NOT DETECTED NOT DETECTED Final   Staphylococcus aureus NOT DETECTED NOT DETECTED Final   Streptococcus species NOT DETECTED NOT DETECTED Final   Streptococcus agalactiae NOT DETECTED NOT DETECTED Final   Streptococcus pneumoniae NOT DETECTED NOT DETECTED Final   Streptococcus pyogenes NOT DETECTED NOT DETECTED Final   Acinetobacter baumannii NOT DETECTED NOT DETECTED Final   Enterobacteriaceae species DETECTED (A) NOT DETECTED Final    Comment: CRITICAL RESULT CALLED TO, READ BACK BY AND VERIFIED WITH: CHRISTY THOMAS,RN '@0455'$  11/13/15 MKELLY,MLT    Enterobacter cloacae complex NOT DETECTED NOT DETECTED Final   Escherichia coli NOT DETECTED NOT DETECTED Final   Klebsiella oxytoca DETECTED (A) NOT DETECTED Final    Comment: CRITICAL RESULT CALLED TO, READ BACK BY AND VERIFIED WITH: CHRISTY THOAMS,RN '@0455'$  11/13/15 MKELLY,MLT    Klebsiella pneumoniae DETECTED (A) NOT DETECTED Final    Comment: CRITICAL RESULT CALLED TO, READ BACK BY AND VERIFIED WITH: CHRISTY THOMAS,RN '@0455'$  11/13/15 MKELLY,MLT    Proteus species NOT DETECTED NOT DETECTED Final   Serratia marcescens NOT DETECTED NOT DETECTED Final   Carbapenem resistance NOT DETECTED NOT DETECTED  Final   Haemophilus influenzae NOT DETECTED NOT DETECTED Final   Neisseria meningitidis NOT DETECTED NOT DETECTED Final   Pseudomonas aeruginosa NOT DETECTED NOT DETECTED Final   Candida albicans NOT DETECTED NOT DETECTED Final   Candida glabrata NOT DETECTED NOT DETECTED Final   Candida krusei NOT DETECTED NOT DETECTED Final   Candida parapsilosis NOT DETECTED NOT DETECTED Final   Candida tropicalis NOT DETECTED NOT DETECTED Final    Comment: Performed at [facility]  Culture, blood (Routine X 2) w Reflex to ID Panel     Status: Abnormal (Preliminary result)   Collection Time: 11/12/15 12:41 AM  Result Value Ref Range Status   Specimen Description BLOOD RIGHT HAND  Final   Special Requests BOTTLES DRAWN AEROBIC AND ANAEROBIC 4CC EACH  Final   Culture  Setup Time   Final    GRAM NEGATIVE RODS RECOVERED FROM AEROBIC AND ANAEROBIC BOTTLES Gram Stain Report Called to,Read Back By and Verified With: DICKERSON,M AT 1700 BY FLYNT,H ON 11/12/15 Performed at [facility]    Culture (A)  Final    KLEBSIELLA OXYTOCA GRAM NEGATIVE RODS SUSCEPTIBILITIES PERFORMED ON PREVIOUS CULTURE WITHIN THE LAST 5 DAYS. Performed at [facility]    Report Status PENDING  Incomplete   Studies/Results: Dg Ercp With Sphincterotomy  Result Date: 11/14/2015 CLINICAL DATA:  Choledocholithiasis. EXAM: ERCP TECHNIQUE: Multiple spot images obtained with the fluoroscopic device and submitted for interpretation post-procedure. FLUOROSCOPY TIME:  Fluoroscopy Time:  10 minutes and 16 seconds COMPARISON:  MRI 11/12/2015 FINDINGS: Common bile duct is markedly dilated and contains multiple stones. Patient reportedly had a sphincterotomy. Balloon sweep was used for stone removal. Retrieval basket was also used for stone removal. Final images demonstrated residual filling defects which could represent gas bubbles or residual stones. IMPRESSION: Biliary dilatation with choledocholithiasis.  Stone removal.  These images were submitted for radiologic interpretation only. Please see the procedural report for the amount of contrast and the fluoroscopy time utilized. Electronically Signed   By: Markus Daft M.D.   On: 11/14/2015 14:32    Medications:  Prior to Admission:  Prescriptions Prior to Admission  Medication Sig Dispense Refill Last Dose  . albuterol (PROVENTIL HFA;VENTOLIN HFA) 108 (90 BASE) MCG/ACT inhaler Inhale 2 puffs into the lungs 2 (two) times daily as needed. Shortness of Breath   unknown  . albuterol (PROVENTIL) (2.5 MG/3ML) 0.083% nebulizer solution Take 3 mLs (2.5 mg total) by nebulization every 4 (four) hours as needed for wheezing. 75 mL 12 unknown  . atenolol (TENORMIN) 25 MG tablet Take 12.5 mg by mouth every morning.    11/11/2015 at [place]  . escitalopram (LEXAPRO) 5 MG tablet Take 5 mg by mouth daily.     11/11/2015 at Unknown time  . LORazepam (ATIVAN) 0.5 MG tablet Take 0.5 mg by mouth at bedtime.    11/10/2015 at Unknown time  . nitroGLYCERIN (NITROSTAT) 0.4 MG SL tablet Place 0.4 mg under the tongue every 5 (five) minutes as needed for chest pain.   unknown  . pantoprazole (PROTONIX) 40 MG tablet Take 40 mg by mouth daily.    11/11/2015 at Unknown time  . simvastatin (ZOCOR) 80 MG tablet Take 1 tablet by mouth at bedtime.    11/10/2015 at Unknown time  . tiotropium (SPIRIVA) 18 MCG inhalation capsule Place 18 mcg into inhaler and inhale daily.   11/11/2015 at Unknown time  . traMADol (ULTRAM) 50 MG tablet Take 1 tablet by mouth daily as needed for moderate pain.    unknown   Scheduled: . atenolol  12.5 mg Oral q morning - 10a  . chlorhexidine  15 mL Mouth Rinse BID  . ciprofloxacin  400 mg Intravenous Q12H  . citalopram  20 mg Oral Daily  . famotidine (PEPCID) IV  20 mg Intravenous Q12H  . LORazepam  0.5 mg Oral QHS  . mouth rinse  15 mL Mouth Rinse q12n4p  . metronidazole  500 mg Intravenous Q8H  . ondansetron  8 mg Oral Once  . potassium chloride  20 mEq Oral Daily   . sodium chloride flush  3 mL Intravenous Q12H  . tiotropium  18 mcg Inhalation Daily   Continuous: . sodium chloride 50  mL/hr at 11/14/15 1650   QZR:AQTMAU chloride, acetaminophen **OR** acetaminophen, albuterol, diphenhydrAMINE, labetalol, ondansetron **OR** ondansetron (ZOFRAN) IV, oxyCODONE, promethazine, senna-docusate, sodium chloride flush  Assesment: She was admitted with biliary pancreatitis and cholangitis. She is markedly improved. She had choledocholithiasis. She had stone extraction yesterday. She has bacteremia and sepsis from 2 Klebsiella organisms. Both are sensitive to Cipro. She is able to eat. Her liver function testing is markedly improved and approaching normal. Her bilirubin has come down. I think she may be on the go home if she tolerates food today Principal Problem:   Pancreatitis Active Problems:   COPD (chronic obstructive pulmonary disease) (HCC)   Coronary atherosclerosis   Anxiety   Depression   Abnormal LFTs   Chronic respiratory failure with hypoxia (HCC)   Hypertension   Hyperbilirubinemia   Hyponatremia   Abdominal pain   GERD (gastroesophageal reflux disease)   Calculus of bile duct with cholangitis and obstruction   Choledocholithiasis   Cholangitis due to bile duct calculus with obstruction    Plan: Potential discharge later today    LOS: 4 days   Lenus Trauger L 11/15/2015, 9:12 AM

## 2015-11-15 NOTE — Progress Notes (Signed)
REVIEWED-NO ADDITIONAL RECOMMENDATIONS.   Subjective: Significantly improved today. No abdominal pain, N/V. Would like to go home.  Objective: Vital signs in last 24 hours: Temp:  [97.5 F (36.4 C)-98.4 F (36.9 C)] 97.5 F (36.4 C) (11/03 0532) Pulse Rate:  [60-89] 66 (11/03 0532) Resp:  [12-28] 18 (11/03 0532) BP: (103-151)/(53-73) 111/53 (11/03 0532) SpO2:  [91 %-99 %] 95 % (11/03 0852) Weight:  [144 lb 14.4 oz (65.7 kg)] 144 lb 14.4 oz (65.7 kg) (11/03 0532) Last BM Date: 11/11/15 General:   Alert and oriented, pleasant Eyes:  No icterus, sclera clear. Conjuctiva pink.  Heart:  S1, S2 present, no murmurs noted.  Lungs: Clear to auscultation bilaterally, without wheezing, rales, or rhonchi.  Abdomen:  Bowel sounds present, soft, non-tender, non-distended. No HSM or hernias noted. No rebound or guarding. Msk:  Symmetrical without gross deformities. Extremities:  Without clubbing or edema. Neurologic:  Alert and  oriented x4;  grossly normal neurologically. Psych:  Alert and cooperative. Normal mood and affect.  Intake/Output from previous day: 11/02 0701 - 11/03 0700 In: 800 [I.V.:800] Out: 700 [Urine:700] Intake/Output this shift: No intake/output data recorded.  Lab Results:  Recent Labs  11/13/15 0607  WBC 7.4  HGB 11.2*  HCT 36.2  PLT 142*   BMET  Recent Labs  11/13/15 0607  NA 140  K 4.2  CL 106  CO2 31  GLUCOSE 99  BUN 12  CREATININE 0.49  CALCIUM 9.2   LFT  Recent Labs  11/13/15 0607 11/15/15 0645  PROT 5.5* 5.5*  ALBUMIN 2.6* 2.6*  AST 57* 29  ALT 101* 57*  ALKPHOS 140* 151*  BILITOT 2.7* 1.5*  BILIDIR  --  0.7*  IBILI  --  0.8   PT/INR No results for input(s): LABPROT, INR in the last 72 hours. Hepatitis Panel No results for input(s): HEPBSAG, HCVAB, HEPAIGM, HEPBIGM in the last 72 hours.   Studies/Results: Dg Ercp With Sphincterotomy  Result Date: 11/14/2015 CLINICAL DATA:  Choledocholithiasis. EXAM: ERCP TECHNIQUE:  Multiple spot images obtained with the fluoroscopic device and submitted for interpretation post-procedure. FLUOROSCOPY TIME:  Fluoroscopy Time:  10 minutes and 16 seconds COMPARISON:  MRI 11/12/2015 FINDINGS: Common bile duct is markedly dilated and contains multiple stones. Patient reportedly had a sphincterotomy. Balloon sweep was used for stone removal. Retrieval basket was also used for stone removal. Final images demonstrated residual filling defects which could represent gas bubbles or residual stones. IMPRESSION: Biliary dilatation with choledocholithiasis.  Stone removal. These images were submitted for radiologic interpretation only. Please see the procedural report for the amount of contrast and the fluoroscopy time utilized. Electronically Signed   By: Markus Daft M.D.   On: 11/14/2015 14:32    Assessment: 82 year old female admitted with acute abdominal pain, N/V, markedly elevated bilirubin with predominantly direct bilirubin elevated, elevated lipase with likely mild biliary pancreatitis. MRCP has confirmed multiple CBD stones. LFTs improved today. Blood culture with GNRs from both aerobic and anaerobic bottles, ID panel positive for three organisms, Enterobacteriaceae species, Klebsiella oxytoca, Klebsiella pneumoniae? Cannot rule out cholangitis. Afebrile. Leukocytosis has resolved. Clinically improved. Abx coverage consists of Cipro/Flagyl due to anaphylaxis to Ceftin.   Patient underwent ERCP yesterday with sphincterotomy and stone extraction. Sphincterotomy, balloon, and basket stone extraction with multiple stones (up to 25), debris, and gravel. Tolerated procedure well.  Today her HFP shows continued improvement with AST/ALT 29/57, total bili improved to 1.5, alk phos lagging at 151, direct bili improved to 0.7.  Today she's clinically significantly  improved. No abdominal pain, n/V. No BM in the past 24 hours. Ate a regular diet today and tolerated well. Labs improved. No other GI  issues at this time.  Plan: 1. Supportive measures 2. Will see the patient as outpatient in 2-4 weeks to follow-up on labs and symptoms 3. Monitor for any recurrent symptoms.   Thank you for allowing [place] to participate in the care of [name]  Eric Gill, DNP, AGNP-C Adult & Gerontological Nurse Practitioner [facility]     LOS: 4 days    11/15/2015, 10:41 AM

## 2015-11-15 NOTE — Progress Notes (Signed)
Patient discharged home.  IV removed - WNL.  Reviewed DC instructions and medications.  Verbalizes understanding.  Instructed to follow up with PCP in 1-2 weeks.  No questions at this time, assisted off unit in NAD.

## 2015-11-15 NOTE — Care Management Important Message (Signed)
Important Message  Patient Details  Name: [name] MRN: [id number] Date of Birth: Jun 12, 1925   Medicare Important Message Given:  Yes    Sherald Barge, RN 11/15/2015, 11:55 AM

## 2015-11-18 ENCOUNTER — Encounter (HOSPITAL_COMMUNITY): Payer: Self-pay | Admitting: Internal Medicine

## 2015-11-18 ENCOUNTER — Encounter: Payer: Self-pay | Admitting: Nurse Practitioner

## 2015-11-18 NOTE — Discharge Summary (Signed)
Physician Discharge Summary  Patient ID: [name] MRN: [id number] DOB/AGE: [birth year] 82 y.o. Primary Care Physician:Mackena Plummer L, MD Admit date: 11/11/2015 Discharge date: 11/18/2015    Discharge Diagnoses:   Principal Problem:   Pancreatitis Active Problems:   COPD (chronic obstructive pulmonary disease) (HCC)   Coronary atherosclerosis   Anxiety   Depression   Abnormal LFTs   Chronic respiratory failure with hypoxia (HCC)   Hypertension   Hyperbilirubinemia   Hyponatremia   Abdominal pain   GERD (gastroesophageal reflux disease)   Calculus of bile duct with cholangitis and obstruction   Gall stones, common bile duct   Cholangitis due to bile duct calculus with obstruction   Sepsis ([place])   Bacteremia due to Klebsiella pneumoniae   Elevated lipase   Jaundice of recent onset   Nausea and vomiting     Medication List    TAKE these medications   albuterol 108 (90 Base) MCG/ACT inhaler Commonly known as:  PROVENTIL HFA;VENTOLIN HFA Inhale 2 puffs into the lungs 2 (two) times daily as needed. Shortness of Breath   albuterol (2.5 MG/3ML) 0.083% nebulizer solution Commonly known as:  PROVENTIL Take 3 mLs (2.5 mg total) by nebulization every 4 (four) hours as needed for wheezing.   atenolol 25 MG tablet Commonly known as:  TENORMIN Take 12.5 mg by mouth every morning.   ciprofloxacin 500 MG tablet Commonly known as:  CIPRO Take 1 tablet (500 mg total) by mouth 2 (two) times daily.   escitalopram 5 MG tablet Commonly known as:  LEXAPRO Take 5 mg by mouth daily.   LORazepam 0.5 MG tablet Commonly known as:  ATIVAN Take 0.5 mg by mouth at bedtime.   nitroGLYCERIN 0.4 MG SL tablet Commonly known as:  NITROSTAT Place 0.4 mg under the tongue every 5 (five) minutes as needed for chest pain.   pantoprazole 40 MG tablet Commonly known as:  PROTONIX Take 40 mg by mouth daily.   simvastatin 80 MG tablet Commonly known as:  ZOCOR Take 1 tablet by mouth at  bedtime.   tiotropium 18 MCG inhalation capsule Commonly known as:  SPIRIVA Place 18 mcg into inhaler and inhale daily.   traMADol 50 MG tablet Commonly known as:  ULTRAM Take 1 tablet by mouth daily as needed for moderate pain.       Discharged Condition:Improved    Consults: Gastroenterology  Significant Diagnostic Studies: Dg Chest 2 View  Result Date: 11/11/2015 CLINICAL DATA:  Acute onset of generalized weakness and shortness of breath. Nausea and vomiting. Initial encounter. EXAM: CHEST  2 VIEW COMPARISON:  Chest radiograph from 01/13/2015 FINDINGS: The lungs are well-aerated. Vascular congestion is noted. Increased interstitial markings raise concern for mild pulmonary edema. No pneumothorax is seen. Scarring is seen at the lung apices. There is new prominence of the right hilum, raising concern for underlying mass. The heart is enlarged. No acute osseous abnormalities are seen. Clips are noted overlying the left chest wall. IMPRESSION: 1. Vascular congestion and cardiomegaly. Increased interstitial markings raise concern for mild pulmonary edema. 2. New prominence of the right hilum, raising concern for underlying mass. CT of the chest would be helpful for further evaluation, when and as deemed clinically appropriate. 3. Scarring at the lung apices. Electronically Signed   By: Garald Balding M.D.   On: 11/11/2015 22:41   Ct Chest Wo Contrast  Result Date: 11/12/2015 CLINICAL DATA:  82 y/o F; history of breast cancer presenting with shortness of breath and abnormal chest radiograph. EXAM:  CT CHEST WITHOUT CONTRAST TECHNIQUE: Multidetector CT imaging of the chest was performed following the standard protocol without IV contrast. COMPARISON:  11/11/2015 chest radiograph. 09/15/2013 CT abdomen and pelvis FINDINGS: Cardiovascular: Aortic atherosclerosis with moderate calcifications. The descending thoracic aorta near the hilum measures 4.2 cm and there is a stable linear calcification  traversing the (series 2, image 101) that previously measured 3.9 cm in 2015. Borderline size of main pulmonary artery. Normal heart size. No pericardial effusion. Moderate to severe coronary artery calcification. Mediastinum/Nodes: No enlarged mediastinal or axillary lymph nodes. Thyroid gland, trachea, and esophagus demonstrate no significant findings. Lungs/Pleura: Moderate predominantly paraseptal emphysema greatest in the mid and upper lung zones. Biapical pleural parenchymal scarring. Pulmonary nodules: Right upper lobe ground-glass 12 mm, series 3: Image 38. Right upper lobe ground-glass 6 mm, 3:57. There are several additional scattered 2-3 mm pulmonary nodules. There are reticular opacities at the lung bases probably representing mild fibrosis. Right basilar platelike atelectasis. No consolidation. Prominence of the right hilum on radiograph corresponds to prominent central pulmonary arteries. Upper Abdomen: Status post cholecystectomy. Nonspecific calcifications right renal hilum. Findings suggestive of intrahepatic biliary ductal dilatation and dilated common bile duct to 20 mm. Left kidney 2 mm stone. Small hiatal hernia. Musculoskeletal: No acute osseous abnormality identified. IMPRESSION: 1. Right hilar prominence that x-ray corresponds to large central pulmonary vessels probably exaggerated by patient's rotation. 2. Descending thoracic aortic aneurysm measuring 4.2 cm, 3.9 cm in 2015 with chronic dissection flap. 3. Moderate predominantly paraseptal emphysema of the lungs. 4. **An incidental finding of potential clinical significance has been found. Multiple ground-glass nodules measuring up to 12 mm. Non-contrast chest CT at 3-6 months is recommended. If nodules persist, subsequent management will be based upon the most suspicious nodule(s). This recommendation follows the consensus statement: Guidelines for Management of Incidental Pulmonary Nodules Detected on CT Images: From the Fleischner Society  2017; Radiology 2017; 284:228-243.** 5. Dilated common bile duct to 20 mm and findings suggestive of intrahepatic biliary ductal dilatation. Correlation with liver enzymes and possible abdominal imaging is recommended as clinically indicated. Electronically Signed   By: Kristine Garbe M.D.   On: 11/12/2015 01:19   US Abdomen Complete  Result Date: 11/12/2015 CLINICAL DATA:  New onset of jaundice and pancreatitis. History of previous cholecystectomy, episodes of pancreatitis, breast malignancy. EXAM: ABDOMEN ULTRASOUND COMPLETE COMPARISON:  Abdominal and pelvic noncontrast CT scan of September 15, 2013 FINDINGS: Gallbladder: The gallbladder is surgically absent. Common bile duct: Diameter: 1.2 cm. A few intraluminal echoes are observed. Liver: The hepatic echotexture is mildly increased diffusely. There is mild intrahepatic ductal dilation which is not entirely new. No discrete hepatic mass is observed. IVC: No abnormality visualized. Pancreas: The pancreatic tail is partially obscured by bowel gas. The common bile duct at the pancreatic head measures 4 mm in diameter. No discrete pancreatic mass is observed. Spleen: Size and appearance within normal limits. Right Kidney: Length: 11.9 cm. Echogenicity within normal limits. No mass or hydronephrosis visualized. Left Kidney: Length: 11.3 cm. There is a lower pole simple appearing cyst measuring 3.4 x 3.5 by 3.2 cm which has increased in size since the previous study. There is no hydronephrosis nor focal solid mass. Abdominal aorta: No aneurysm visualized. Other findings: There is no ascites. IMPRESSION: 1. No definite sonographic evidence of acute pancreatitis. There is mild dilation of the common bile duct and intrahepatic ducts with a few intraluminal echoes in the CBD which may reflect gravel or stones. Abdominal MRI - MRCP would be a  useful next imaging steps. 2. Probable fatty infiltrative change of the liver. 3. Simple appearing cyst in the lower  pole of the left kidney. Electronically Signed   By: David  Martinique M.D.   On: 11/12/2015 09:20   Mr 3d Recon At Scanner  Result Date: 11/12/2015 CLINICAL DATA:  Abnormal LFTs, abnormal CBD on CT chest and abdominal ultrasound EXAM: MRI ABDOMEN WITHOUT AND WITH CONTRAST (INCLUDING MRCP) TECHNIQUE: Multiplanar multisequence MR imaging of the abdomen was performed both before and after the administration of intravenous contrast. Heavily T2-weighted images of the biliary and pancreatic ducts were obtained, and three-dimensional MRCP images were rendered by post processing. CONTRAST:  86m MULTIHANCE GADOBENATE DIMEGLUMINE 529 MG/ML IV SOLN COMPARISON:  Abdominal ultrasound dated 11/12/2015. CT chest dated 11/12/2015. FINDINGS: Motion degraded images. Lower chest: Mild subpleural patchy opacities in the bilateral lower lobes, likely reflecting subpleural reticulation/fibrosis when correlating with recent CT, although superimposed atelectasis is possible. Hepatobiliary: Liver is within normal limits. No suspicious/enhancing hepatic lesions. No hepatic steatosis. Status post cholecystectomy. Dilated common duct, measuring up to 17 mm, with multiple mid/distal CBD stones measuring up to 12 mm. Up to eight CBD stones are suspected. 2-3 cystic duct remnant stones are also suspected, measuring up to 7 mm. Pancreas:  Within normal limits. Spleen:  Within normal limits. Adrenals/Urinary Tract:  Adrenal glands are within normal limits. Dominant anterior left lower pole renal cysts measuring 4.3 cm, with mildly irregular margins (series 4/ image 31), but no enhancing/solid components. Additional small bilateral renal cysts. No hydronephrosis. Stomach/Bowel: Stomach is within normal limits. Visualized bowel is unremarkable. Vascular/Lymphatic:  No evidence of abdominal aortic aneurysm. No suspicious abdominal lymphadenopathy. Other:  No abdominal ascites. Musculoskeletal: Degenerative changes of the visualized thoracolumbar  spine with mild upper lumbar levoscoliosis. IMPRESSION: Motion degraded images. Status post cholecystectomy. Dilated common duct, measuring up to 17 mm, with multiple low mid/distal CBD stones measuring up to 12 mm, as above. 2-3 cystic duct remnant stones are also suspected. Additional ancillary findings as above. Electronically Signed   By: SJulian HyM.D.   On: 11/12/2015 17:23   Dg Ercp With Sphincterotomy  Result Date: 11/14/2015 CLINICAL DATA:  Choledocholithiasis. EXAM: ERCP TECHNIQUE: Multiple spot images obtained with the fluoroscopic device and submitted for interpretation post-procedure. FLUOROSCOPY TIME:  Fluoroscopy Time:  10 minutes and 16 seconds COMPARISON:  MRI 11/12/2015 FINDINGS: Common bile duct is markedly dilated and contains multiple stones. Patient reportedly had a sphincterotomy. Balloon sweep was used for stone removal. Retrieval basket was also used for stone removal. Final images demonstrated residual filling defects which could represent gas bubbles or residual stones. IMPRESSION: Biliary dilatation with choledocholithiasis.  Stone removal. These images were submitted for radiologic interpretation only. Please see the procedural report for the amount of contrast and the fluoroscopy time utilized. Electronically Signed   By: AMarkus DaftM.D.   On: 11/14/2015 14:32   Mr Abdomen Mrcp WMoise BoringContast  Result Date: 11/12/2015 CLINICAL DATA:  Abnormal LFTs, abnormal CBD on CT chest and abdominal ultrasound EXAM: MRI ABDOMEN WITHOUT AND WITH CONTRAST (INCLUDING MRCP) TECHNIQUE: Multiplanar multisequence MR imaging of the abdomen was performed both before and after the administration of intravenous contrast. Heavily T2-weighted images of the biliary and pancreatic ducts were obtained, and three-dimensional MRCP images were rendered by post processing. CONTRAST:  172mMULTIHANCE GADOBENATE DIMEGLUMINE 529 MG/ML IV SOLN COMPARISON:  Abdominal ultrasound dated 11/12/2015. CT chest dated  11/12/2015. FINDINGS: Motion degraded images. Lower chest: Mild subpleural patchy opacities in the bilateral  lower lobes, likely reflecting subpleural reticulation/fibrosis when correlating with recent CT, although superimposed atelectasis is possible. Hepatobiliary: Liver is within normal limits. No suspicious/enhancing hepatic lesions. No hepatic steatosis. Status post cholecystectomy. Dilated common duct, measuring up to 17 mm, with multiple mid/distal CBD stones measuring up to 12 mm. Up to eight CBD stones are suspected. 2-3 cystic duct remnant stones are also suspected, measuring up to 7 mm. Pancreas:  Within normal limits. Spleen:  Within normal limits. Adrenals/Urinary Tract:  Adrenal glands are within normal limits. Dominant anterior left lower pole renal cysts measuring 4.3 cm, with mildly irregular margins (series 4/ image 31), but no enhancing/solid components. Additional small bilateral renal cysts. No hydronephrosis. Stomach/Bowel: Stomach is within normal limits. Visualized bowel is unremarkable. Vascular/Lymphatic:  No evidence of abdominal aortic aneurysm. No suspicious abdominal lymphadenopathy. Other:  No abdominal ascites. Musculoskeletal: Degenerative changes of the visualized thoracolumbar spine with mild upper lumbar levoscoliosis. IMPRESSION: Motion degraded images. Status post cholecystectomy. Dilated common duct, measuring up to 17 mm, with multiple low mid/distal CBD stones measuring up to 12 mm, as above. 2-3 cystic duct remnant stones are also suspected. Additional ancillary findings as above. Electronically Signed   By: Julian Hy M.D.   On: 11/12/2015 17:23    Lab Results: Basic Metabolic Panel: No results for input(s): NA, K, CL, CO2, GLUCOSE, BUN, CREATININE, CALCIUM, MG, PHOS in the last 72 hours. Liver Function Tests: No results for input(s): AST, ALT, ALKPHOS, BILITOT, PROT, ALBUMIN in the last 72 hours.   CBC: No results for input(s): WBC, NEUTROABS, HGB, HCT,  MCV, PLT in the last 72 hours.  Recent Results (from the past 240 hour(s))  Urine culture     Status: Abnormal   Collection Time: 11/11/15  9:15 PM  Result Value Ref Range Status   Specimen Description URINE, CLEAN CATCH  Final   Special Requests NONE  Final   Culture (A)  Final    <10,000 COLONIES/mL INSIGNIFICANT GROWTH Performed at [facility]    Report Status 11/13/2015 FINAL  Final  Culture, blood (Routine X 2) w Reflex to ID Panel     Status: Abnormal   Collection Time: 11/12/15 12:30 AM  Result Value Ref Range Status   Specimen Description BLOOD RIGHT ANTECUBITAL  Final   Special Requests BOTTLES DRAWN AEROBIC AND ANAEROBIC 5CC EACH  Final   Culture  Setup Time   Final    GRAM NEGATIVE RODS recovered from aerobic and anaerobic bottles Gram Stain Report Called to,Read Back By and Verified With: Dickerson,M at 1700 by Flynt,H 11/12/15 Performed at [place]  (A)  Final   Report Status 11/15/2015 FINAL  Final   Organism ID, Bacteria KLEBSIELLA PNEUMONIAE  Final   Organism ID, Bacteria KLEBSIELLA OXYTOCA  Final      Susceptibility   Klebsiella oxytoca - MIC*    AMPICILLIN >=32 RESISTANT Resistant     CEFAZOLIN 16 SENSITIVE Sensitive     CEFEPIME <=1 SENSITIVE Sensitive     CEFTAZIDIME <=1 SENSITIVE Sensitive     CEFTRIAXONE <=1 SENSITIVE Sensitive     CIPROFLOXACIN <=0.25 SENSITIVE Sensitive     GENTAMICIN <=1 SENSITIVE Sensitive     IMIPENEM <=0.25 SENSITIVE Sensitive     TRIMETH/SULFA <=20 SENSITIVE Sensitive     AMPICILLIN/SULBACTAM 8 SENSITIVE Sensitive     PIP/TAZO <=4 SENSITIVE Sensitive     Extended ESBL NEGATIVE Sensitive     * KLEBSIELLA OXYTOCA   Klebsiella  pneumoniae - MIC*    AMPICILLIN >=32 RESISTANT Resistant     CEFAZOLIN <=4 SENSITIVE Sensitive     CEFEPIME <=1 SENSITIVE Sensitive     CEFTAZIDIME <=1 SENSITIVE Sensitive     CEFTRIAXONE <=1 SENSITIVE Sensitive     CIPROFLOXACIN  <=0.25 SENSITIVE Sensitive     GENTAMICIN <=1 SENSITIVE Sensitive     IMIPENEM <=0.25 SENSITIVE Sensitive     TRIMETH/SULFA <=20 SENSITIVE Sensitive     AMPICILLIN/SULBACTAM 4 SENSITIVE Sensitive     PIP/TAZO <=4 SENSITIVE Sensitive     Extended ESBL NEGATIVE Sensitive     * KLEBSIELLA PNEUMONIAE  Blood Culture ID Panel (Reflexed)     Status: Abnormal   Collection Time: 11/12/15 12:30 AM  Result Value Ref Range Status   Enterococcus species NOT DETECTED NOT DETECTED Final   Vancomycin resistance NOT DETECTED NOT DETECTED Final   Listeria monocytogenes NOT DETECTED NOT DETECTED Final   Staphylococcus species NOT DETECTED NOT DETECTED Final   Staphylococcus aureus NOT DETECTED NOT DETECTED Final   Streptococcus species NOT DETECTED NOT DETECTED Final   Streptococcus agalactiae NOT DETECTED NOT DETECTED Final   Streptococcus pneumoniae NOT DETECTED NOT DETECTED Final   Streptococcus pyogenes NOT DETECTED NOT DETECTED Final   Acinetobacter baumannii NOT DETECTED NOT DETECTED Final   Enterobacteriaceae species DETECTED (A) NOT DETECTED Final    Comment: CRITICAL RESULT CALLED TO, READ BACK BY AND VERIFIED WITH: CHRISTY THOMAS,RN '@0455'$  11/13/15 MKELLY,MLT    Enterobacter cloacae complex NOT DETECTED NOT DETECTED Final   Escherichia coli NOT DETECTED NOT DETECTED Final   Klebsiella oxytoca DETECTED (A) NOT DETECTED Final    Comment: CRITICAL RESULT CALLED TO, READ BACK BY AND VERIFIED WITH: CHRISTY THOAMS,RN '@0455'$  11/13/15 MKELLY,MLT    Klebsiella pneumoniae DETECTED (A) NOT DETECTED Final    Comment: CRITICAL RESULT CALLED TO, READ BACK BY AND VERIFIED WITH: CHRISTY THOMAS,RN '@0455'$  11/13/15 MKELLY,MLT    Proteus species NOT DETECTED NOT DETECTED Final   Serratia marcescens NOT DETECTED NOT DETECTED Final   Carbapenem resistance NOT DETECTED NOT DETECTED Final   Haemophilus influenzae NOT DETECTED NOT DETECTED Final   Neisseria meningitidis NOT DETECTED NOT DETECTED Final    Pseudomonas aeruginosa NOT DETECTED NOT DETECTED Final   Candida albicans NOT DETECTED NOT DETECTED Final   Candida glabrata NOT DETECTED NOT DETECTED Final   Candida krusei NOT DETECTED NOT DETECTED Final   Candida parapsilosis NOT DETECTED NOT DETECTED Final   Candida tropicalis NOT DETECTED NOT DETECTED Final    Comment: Performed at [facility]  Culture, blood (Routine X 2) w Reflex to ID Panel     Status: Abnormal   Collection Time: 11/12/15 12:41 AM  Result Value Ref Range Status   Specimen Description BLOOD RIGHT HAND  Final   Special Requests BOTTLES DRAWN AEROBIC AND ANAEROBIC 4CC EACH  Final   Culture  Setup Time   Final    GRAM NEGATIVE RODS RECOVERED FROM AEROBIC AND ANAEROBIC BOTTLES Gram Stain Report Called to,Read Back By and Verified With: DICKERSON,M AT 1700 BY FLYNT,H ON 11/12/15 Performed at [facility]    Culture (A)  Final    KLEBSIELLA OXYTOCA KLEBSIELLA PNEUMONIAE SUSCEPTIBILITIES PERFORMED ON PREVIOUS CULTURE WITHIN THE LAST 5 DAYS. Performed at [facility]    Report Status 11/15/2015 FINAL  Final     Hospital Course: This is an 82 year old who came to the hospital because of abdominal pain nausea and vomiting. She was found to have elevated lipase consistent with  pancreatitis. CT showed that she also had dilated bile duct and MRCP showed that she had stones in the common bile duct. She had positive blood cultures with mild sepsis. She was treated with antibiotics. She was felt to have cholangitis. Consultation was obtained with GI and she eventually had ERCP with stone extraction. She was improving before the ERCP but was much better after this was done was able to tolerate a diet and able to go home the next day. She was back at baseline.  Discharge Exam: Blood pressure (!) 111/53, pulse 66, temperature 97.5 F (36.4 C), temperature source Oral, resp. rate 18, height '5\' 5"'$  (1.651 m), weight 65.7 kg (144 lb 14.4 oz), SpO2 95 %. Awake and  alert. No longer jaundiced. Chest is clear. Heart is regular.  Disposition: Home    Follow-up Information    Zaeden Lastinger L, MD Follow up in 2 week(s).   Specialty:  Pulmonary Disease Contact information: [place] [address] [phone]           Signed: Akasia Ahmad L   11/18/2015, 7:22 AM

## 2015-12-10 ENCOUNTER — Ambulatory Visit: Payer: Medicare Other | Admitting: Nurse Practitioner

## 2015-12-25 ENCOUNTER — Ambulatory Visit: Payer: Medicare Other | Admitting: Nurse Practitioner

## 2016-01-21 ENCOUNTER — Ambulatory Visit: Payer: Medicare Other | Admitting: Internal Medicine

## 2016-01-22 ENCOUNTER — Ambulatory Visit: Payer: Medicare Other | Admitting: Gastroenterology

## 2016-02-09 DIAGNOSIS — J449 Chronic obstructive pulmonary disease, unspecified: Secondary | ICD-10-CM | POA: Diagnosis not present

## 2016-03-03 DIAGNOSIS — I10 Essential (primary) hypertension: Secondary | ICD-10-CM | POA: Diagnosis not present

## 2016-03-03 DIAGNOSIS — G459 Transient cerebral ischemic attack, unspecified: Secondary | ICD-10-CM | POA: Diagnosis not present

## 2016-03-03 DIAGNOSIS — J449 Chronic obstructive pulmonary disease, unspecified: Secondary | ICD-10-CM | POA: Diagnosis not present

## 2016-03-03 DIAGNOSIS — I25119 Atherosclerotic heart disease of native coronary artery with unspecified angina pectoris: Secondary | ICD-10-CM | POA: Diagnosis not present

## 2016-03-11 DIAGNOSIS — J449 Chronic obstructive pulmonary disease, unspecified: Secondary | ICD-10-CM | POA: Diagnosis not present

## 2016-03-30 DIAGNOSIS — J449 Chronic obstructive pulmonary disease, unspecified: Secondary | ICD-10-CM | POA: Diagnosis not present

## 2016-03-30 DIAGNOSIS — W19XXXA Unspecified fall, initial encounter: Secondary | ICD-10-CM | POA: Diagnosis not present

## 2016-03-30 DIAGNOSIS — R682 Dry mouth, unspecified: Secondary | ICD-10-CM | POA: Diagnosis not present

## 2016-04-08 DIAGNOSIS — J449 Chronic obstructive pulmonary disease, unspecified: Secondary | ICD-10-CM | POA: Diagnosis not present

## 2016-05-09 DIAGNOSIS — J449 Chronic obstructive pulmonary disease, unspecified: Secondary | ICD-10-CM | POA: Diagnosis not present

## 2016-06-01 DIAGNOSIS — I739 Peripheral vascular disease, unspecified: Secondary | ICD-10-CM | POA: Diagnosis not present

## 2016-06-01 DIAGNOSIS — I25119 Atherosclerotic heart disease of native coronary artery with unspecified angina pectoris: Secondary | ICD-10-CM | POA: Diagnosis not present

## 2016-06-01 DIAGNOSIS — J449 Chronic obstructive pulmonary disease, unspecified: Secondary | ICD-10-CM | POA: Diagnosis not present

## 2016-06-01 DIAGNOSIS — J9611 Chronic respiratory failure with hypoxia: Secondary | ICD-10-CM | POA: Diagnosis not present

## 2016-06-08 DIAGNOSIS — J449 Chronic obstructive pulmonary disease, unspecified: Secondary | ICD-10-CM | POA: Diagnosis not present

## 2016-07-09 DIAGNOSIS — J449 Chronic obstructive pulmonary disease, unspecified: Secondary | ICD-10-CM | POA: Diagnosis not present

## 2016-07-14 DIAGNOSIS — J449 Chronic obstructive pulmonary disease, unspecified: Secondary | ICD-10-CM | POA: Diagnosis not present

## 2016-07-14 DIAGNOSIS — J9611 Chronic respiratory failure with hypoxia: Secondary | ICD-10-CM | POA: Diagnosis not present

## 2016-07-14 DIAGNOSIS — I25119 Atherosclerotic heart disease of native coronary artery with unspecified angina pectoris: Secondary | ICD-10-CM | POA: Diagnosis not present

## 2016-07-14 DIAGNOSIS — I739 Peripheral vascular disease, unspecified: Secondary | ICD-10-CM | POA: Diagnosis not present

## 2016-08-08 DIAGNOSIS — J449 Chronic obstructive pulmonary disease, unspecified: Secondary | ICD-10-CM | POA: Diagnosis not present

## 2016-08-10 DIAGNOSIS — I10 Essential (primary) hypertension: Secondary | ICD-10-CM | POA: Diagnosis not present

## 2016-08-10 DIAGNOSIS — M25552 Pain in left hip: Secondary | ICD-10-CM | POA: Diagnosis not present

## 2016-08-10 DIAGNOSIS — K5900 Constipation, unspecified: Secondary | ICD-10-CM | POA: Diagnosis not present

## 2016-08-10 DIAGNOSIS — M545 Low back pain: Secondary | ICD-10-CM | POA: Diagnosis not present

## 2016-08-10 DIAGNOSIS — R2689 Other abnormalities of gait and mobility: Secondary | ICD-10-CM | POA: Diagnosis not present

## 2016-08-10 DIAGNOSIS — J449 Chronic obstructive pulmonary disease, unspecified: Secondary | ICD-10-CM | POA: Diagnosis not present

## 2016-08-19 NOTE — Progress Notes (Signed)
REVIEWED-NO ADDITIONAL RECOMMENDATIONS. 

## 2016-09-01 DIAGNOSIS — I10 Essential (primary) hypertension: Secondary | ICD-10-CM | POA: Diagnosis not present

## 2016-09-01 DIAGNOSIS — J449 Chronic obstructive pulmonary disease, unspecified: Secondary | ICD-10-CM | POA: Diagnosis not present

## 2016-09-01 DIAGNOSIS — E785 Hyperlipidemia, unspecified: Secondary | ICD-10-CM | POA: Diagnosis not present

## 2016-09-01 DIAGNOSIS — I25119 Atherosclerotic heart disease of native coronary artery with unspecified angina pectoris: Secondary | ICD-10-CM | POA: Diagnosis not present

## 2016-09-08 DIAGNOSIS — J449 Chronic obstructive pulmonary disease, unspecified: Secondary | ICD-10-CM | POA: Diagnosis not present

## 2016-10-09 DIAGNOSIS — J449 Chronic obstructive pulmonary disease, unspecified: Secondary | ICD-10-CM | POA: Diagnosis not present

## 2016-11-08 DIAGNOSIS — J449 Chronic obstructive pulmonary disease, unspecified: Secondary | ICD-10-CM | POA: Diagnosis not present

## 2016-12-01 DIAGNOSIS — I25119 Atherosclerotic heart disease of native coronary artery with unspecified angina pectoris: Secondary | ICD-10-CM | POA: Diagnosis not present

## 2016-12-01 DIAGNOSIS — J449 Chronic obstructive pulmonary disease, unspecified: Secondary | ICD-10-CM | POA: Diagnosis not present

## 2016-12-01 DIAGNOSIS — Z23 Encounter for immunization: Secondary | ICD-10-CM | POA: Diagnosis not present

## 2016-12-01 DIAGNOSIS — M545 Low back pain: Secondary | ICD-10-CM | POA: Diagnosis not present

## 2016-12-01 DIAGNOSIS — I10 Essential (primary) hypertension: Secondary | ICD-10-CM | POA: Diagnosis not present

## 2016-12-09 DIAGNOSIS — J449 Chronic obstructive pulmonary disease, unspecified: Secondary | ICD-10-CM | POA: Diagnosis not present

## 2016-12-27 IMAGING — DX DG CHEST 2V
2 series · 2 of 2 positions shown · non-contrast
Comparison: 09/15/2013

CLINICAL DATA: Productive cough

EXAM:
CHEST  2 VIEW

[chest pa]
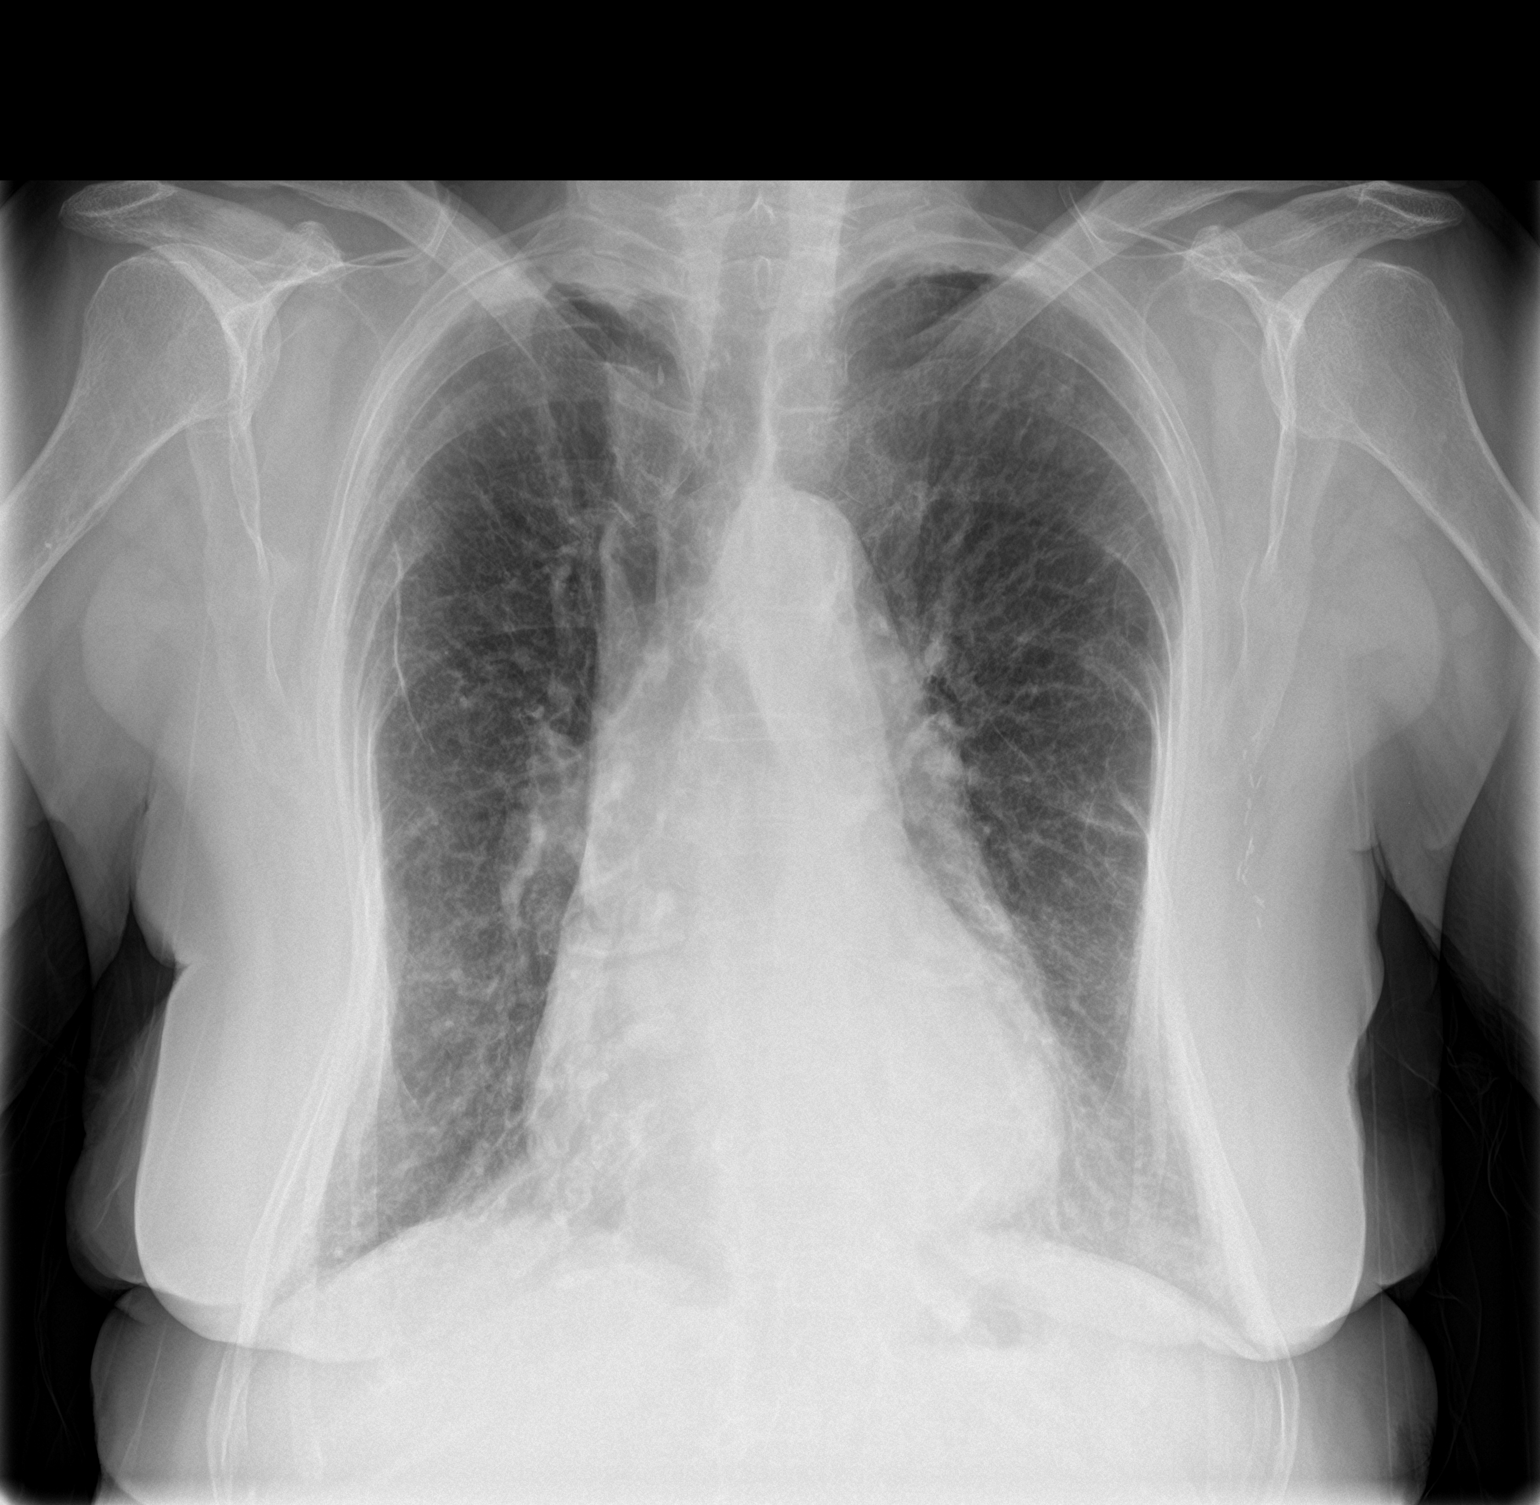

[chest lat]
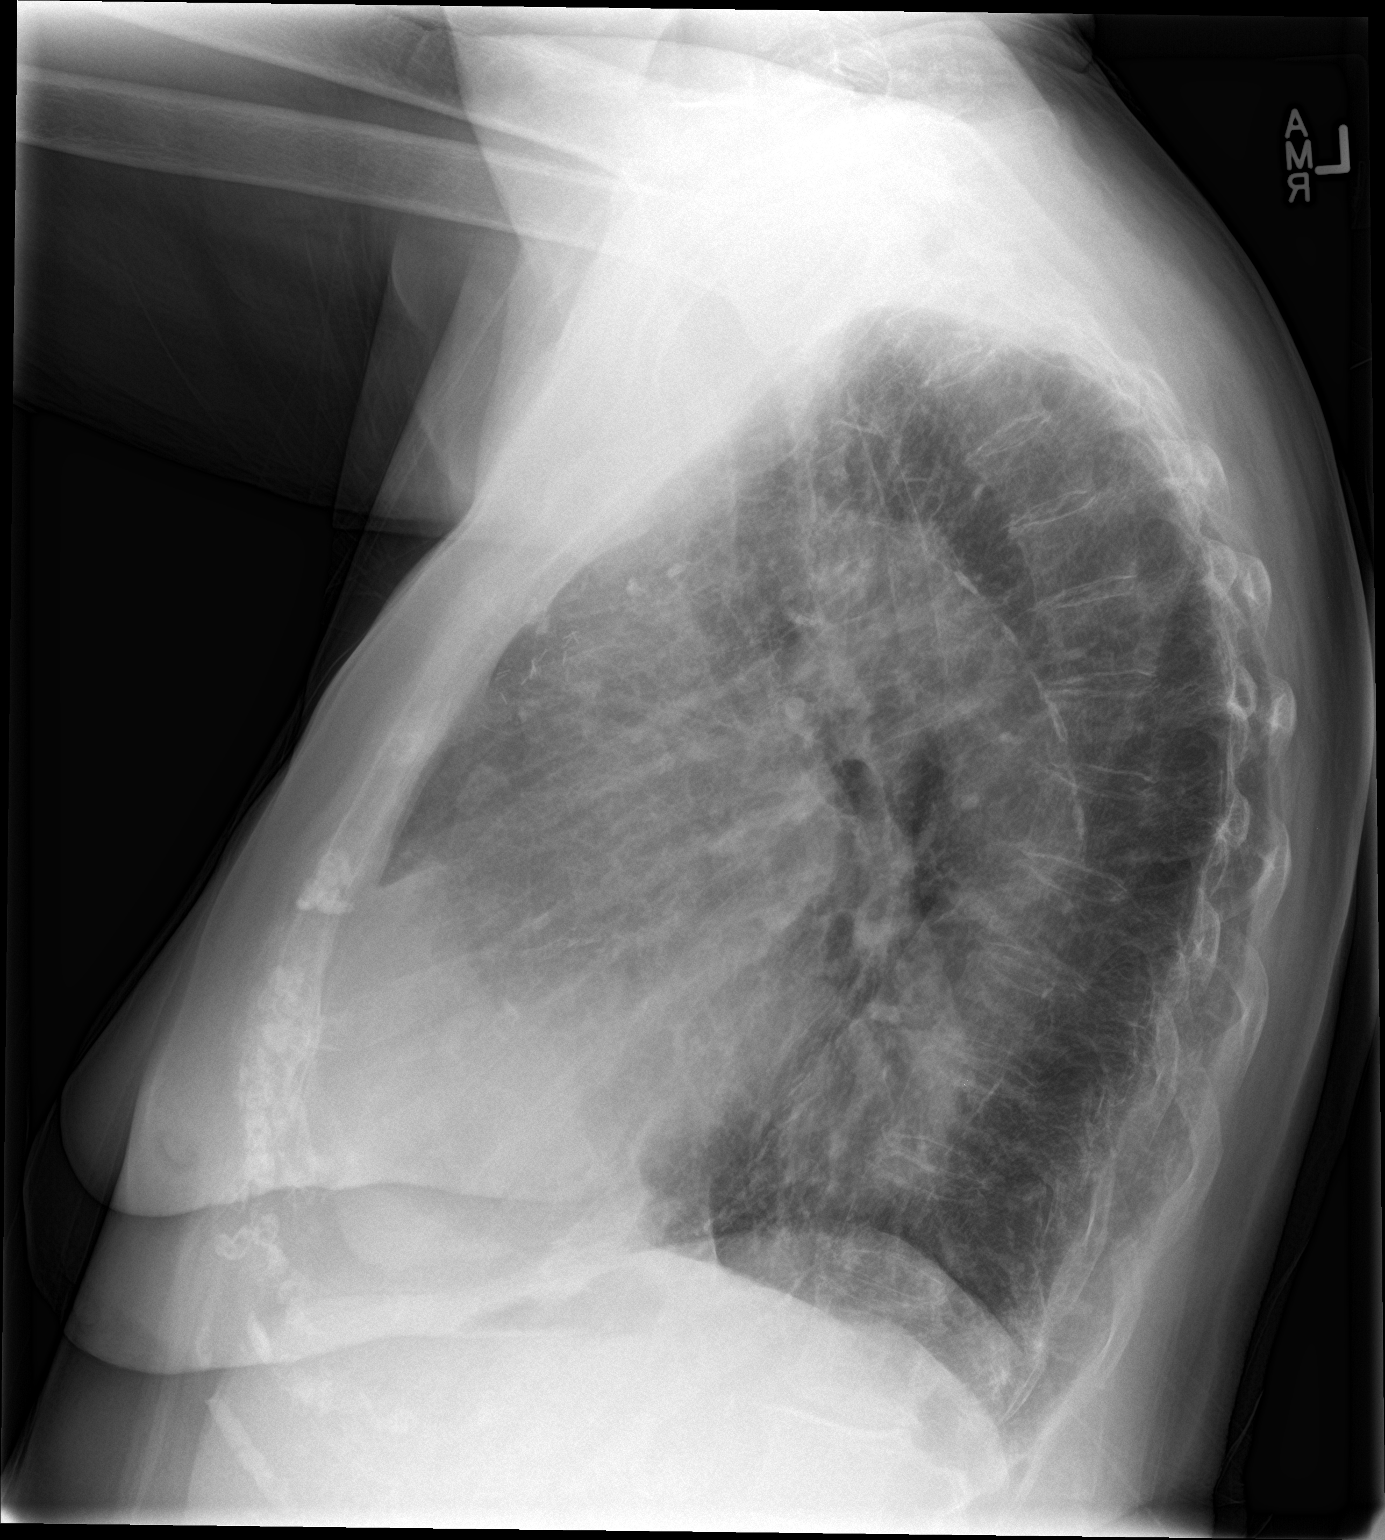

[2 of 2 positions shown; findings below may reference images not displayed]

FINDINGS: The heart is moderately enlarged. Diffuse interstitial lung disease
is stable. Prominent central pulmonary vasculature is stable. No
pneumothorax. No pleural effusion.
IMPRESSION: Cardiomegaly and interstitial lung disease.

## 2017-01-08 DIAGNOSIS — J449 Chronic obstructive pulmonary disease, unspecified: Secondary | ICD-10-CM | POA: Diagnosis not present

## 2017-02-08 DIAGNOSIS — J449 Chronic obstructive pulmonary disease, unspecified: Secondary | ICD-10-CM | POA: Diagnosis not present

## 2017-02-09 DIAGNOSIS — K529 Noninfective gastroenteritis and colitis, unspecified: Secondary | ICD-10-CM | POA: Diagnosis not present

## 2017-02-09 DIAGNOSIS — I10 Essential (primary) hypertension: Secondary | ICD-10-CM | POA: Diagnosis not present

## 2017-02-09 DIAGNOSIS — J441 Chronic obstructive pulmonary disease with (acute) exacerbation: Secondary | ICD-10-CM | POA: Diagnosis not present

## 2017-02-09 DIAGNOSIS — I2510 Atherosclerotic heart disease of native coronary artery without angina pectoris: Secondary | ICD-10-CM | POA: Diagnosis not present

## 2017-03-01 DIAGNOSIS — I25119 Atherosclerotic heart disease of native coronary artery with unspecified angina pectoris: Secondary | ICD-10-CM | POA: Diagnosis not present

## 2017-03-01 DIAGNOSIS — J9611 Chronic respiratory failure with hypoxia: Secondary | ICD-10-CM | POA: Diagnosis not present

## 2017-03-01 DIAGNOSIS — J449 Chronic obstructive pulmonary disease, unspecified: Secondary | ICD-10-CM | POA: Diagnosis not present

## 2017-03-11 DIAGNOSIS — J449 Chronic obstructive pulmonary disease, unspecified: Secondary | ICD-10-CM | POA: Diagnosis not present

## 2017-04-08 DIAGNOSIS — J449 Chronic obstructive pulmonary disease, unspecified: Secondary | ICD-10-CM | POA: Diagnosis not present

## 2017-04-14 IMAGING — NM NM PULMONARY VENT & PERF
16 series · 16 of 16 positions shown · non-contrast
Comparison: 01/13/2015 chest radiograph

CLINICAL DATA: 89-year-old female with acute shortness of breath
and cough with elevated D-dimer.

EXAM:
NUCLEAR MEDICINE VENTILATION - PERFUSION LUNG SCAN
TECHNIQUE: Ventilation images were obtained in multiple projections using
inhaled aerosol Wc-HHm DTPA. Perfusion images were obtained in
multiple projections after intravenous injection of Wc-HHm MAA.
RADIOPHARMACEUTICALS:  33.0 millicuries Vechnetium-55m DTPA aerosol
inhalation and 4.2 millicurie Vechnetium-55m MAA IV

[Series 1: ant/post vent · 4.14mm/px · 1 of 1 slices shown (1 of 2)]
[im 1/1]
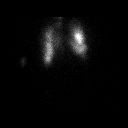

[Series 1: ant/post vent · 4.14mm/px · 1 of 1 slices shown (2 of 2)]
[im 1/1]
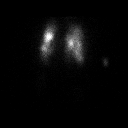

[Series 2: lao/rpo vent · 4.14mm/px · 1 of 1 slices shown (1 of 2)]
[im 1/1]
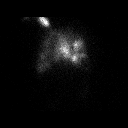

[Series 2: lao/rpo vent · 4.14mm/px · 1 of 1 slices shown (2 of 2)]
[im 1/1]
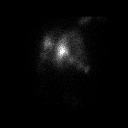

[Series 3: lt lat/rt lat vent · 4.14mm/px · 1 of 1 slices shown (1 of 2)]
[im 1/1]
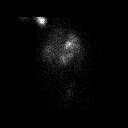

[Series 3: lt lat/rt lat vent · 4.14mm/px · 1 of 1 slices shown (2 of 2)]
[im 1/1]
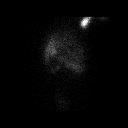

[Series 4: lpo/rao vent · 4.14mm/px · 1 of 1 slices shown (1 of 2)]
[im 1/1]
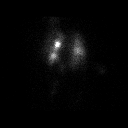

[Series 4: lpo/rao vent · 4.14mm/px · 1 of 1 slices shown (2 of 2)]
[im 1/1]
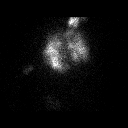

[Series 5: ant/post perf · 4.14mm/px · 1 of 1 slices shown (1 of 2)]
[im 1/1]
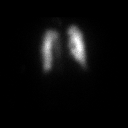

[Series 5: ant/post perf · 4.14mm/px · 1 of 1 slices shown (2 of 2)]
[im 1/1]
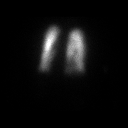

[Series 6: lao/rpo perf · 4.14mm/px · 1 of 1 slices shown (1 of 2)]
[im 1/1]
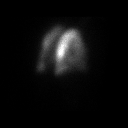

[Series 6: lao/rpo perf · 4.14mm/px · 1 of 1 slices shown (2 of 2)]
[im 1/1]
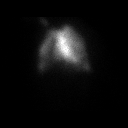

[Series 7: lt lat/rt lat perf · 4.14mm/px · 1 of 1 slices shown (1 of 2)]
[im 1/1]
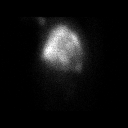

[Series 7: lt lat/rt lat perf · 4.14mm/px · 1 of 1 slices shown (2 of 2)]
[im 1/1]
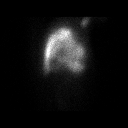

[Series 8: lpo/rao perf · 4.14mm/px · 1 of 1 slices shown (1 of 2)]
[im 1/1]
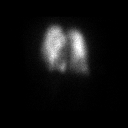

[Series 8: lpo/rao perf · 4.14mm/px · 1 of 1 slices shown (2 of 2)]
[im 1/1]
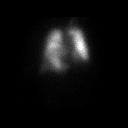

[16 of 16 positions shown; findings below may reference images not displayed]

FINDINGS: Ventilation: Patchy areas of decreased ventilation noted.

Perfusion: No wedge shaped peripheral perfusion defects noted.
Minimal nonsegmental patchy areas of decreased perfusion match
ventilation.
IMPRESSION: Low probability for pulmonary embolus (less than 20%).

## 2017-05-09 DIAGNOSIS — J449 Chronic obstructive pulmonary disease, unspecified: Secondary | ICD-10-CM | POA: Diagnosis not present

## 2017-05-31 DIAGNOSIS — Z0000 Encounter for general adult medical examination without abnormal findings: Secondary | ICD-10-CM | POA: Diagnosis not present

## 2017-06-01 DIAGNOSIS — Z0000 Encounter for general adult medical examination without abnormal findings: Secondary | ICD-10-CM | POA: Diagnosis not present

## 2017-06-01 DIAGNOSIS — J449 Chronic obstructive pulmonary disease, unspecified: Secondary | ICD-10-CM | POA: Diagnosis not present

## 2017-06-01 DIAGNOSIS — I25119 Atherosclerotic heart disease of native coronary artery with unspecified angina pectoris: Secondary | ICD-10-CM | POA: Diagnosis not present

## 2017-06-01 DIAGNOSIS — I10 Essential (primary) hypertension: Secondary | ICD-10-CM | POA: Diagnosis not present

## 2017-06-08 DIAGNOSIS — J449 Chronic obstructive pulmonary disease, unspecified: Secondary | ICD-10-CM | POA: Diagnosis not present

## 2017-07-09 DIAGNOSIS — J449 Chronic obstructive pulmonary disease, unspecified: Secondary | ICD-10-CM | POA: Diagnosis not present

## 2017-07-20 ENCOUNTER — Ambulatory Visit: Payer: Medicare Other | Admitting: Nurse Practitioner

## 2017-07-20 ENCOUNTER — Other Ambulatory Visit: Payer: Self-pay

## 2017-07-20 ENCOUNTER — Encounter: Payer: Self-pay | Admitting: Nurse Practitioner

## 2017-07-20 VITALS — BP 109/66 | HR 80 | Temp 97.5°F | Ht 64.0 in | Wt 140.0 lb

## 2017-07-20 DIAGNOSIS — R1011 Right upper quadrant pain: Secondary | ICD-10-CM

## 2017-07-20 DIAGNOSIS — R112 Nausea with vomiting, unspecified: Secondary | ICD-10-CM

## 2017-07-20 DIAGNOSIS — K851 Biliary acute pancreatitis without necrosis or infection: Secondary | ICD-10-CM

## 2017-07-20 MED ORDER — ONDANSETRON 4 MG PO TBDP: 4.0000 mg | ORAL_TABLET | Freq: Three times a day (TID) | ORAL | 0 refills | Status: DC | PRN

## 2017-07-20 NOTE — Progress Notes (Signed)
cc'ed to pcp °

## 2017-07-20 NOTE — Patient Instructions (Signed)
1. Have your labs drawn as soon as you can. 2. We will help schedule your ultrasound for you. 3. I have sent Zofran as he is under the tongue) tabs to your pharmacy.  You could take this every 6 hours as needed for nausea. 4. Return for follow-up in 6 to 8 weeks. 5. Call us if you have any questions or concerns.  At [facility] we value your feedback. You may receive a survey about your visit today. Please share your experience as we strive to create trusting relationships with our patients to provide genuine, compassionate, quality care.  It was great to see you both today!  I hope you have a wonderful summer!!

## 2017-07-20 NOTE — Assessment & Plan Note (Signed)
Patient describes persistent nausea on a daily basis.  Occasional vomiting, no hematemesis.  Her nausea occurs in waves.  It is not triggered by any known factors.  I will send in Zofran ODT every 6 hours to the pharmacy.  Follow-up in 6 to 8 weeks.

## 2017-07-20 NOTE — Assessment & Plan Note (Signed)
The patient does have a history of gallstone pancreatitis status post ERCP in 2017.  She did have dilated common bile duct at that time.  Further work-up as per below.  Return for follow-up 6 to 8 weeks.

## 2017-07-20 NOTE — Assessment & Plan Note (Signed)
The patient has right upper quadrant pain associated with nausea and occasional vomiting.  She does have a history of gallstone pancreatitis status post ERCP in 2017.  She has previously had elevated LFTs on Lipitor which improved after going back to Crestor.  She is now on Zocor.  She does not appear jaundiced.  At this point, given her history is, I will recheck CBC, BMP, HFP, lipase.  I will check a right upper quadrant ultrasound specifically for any liver abnormalities and common bile duct dilation.  Return for follow-up in 6 to 8 weeks.

## 2017-07-20 NOTE — Progress Notes (Signed)
Referring Provider: Sinda Du, MD Primary Care Physician:  Sinda Du, MD Primary GI:  Dr. Oneida Alar  Chief Complaint  Patient presents with  . Abdominal Pain    "has sick waves that goes over her", RUQ "feels like a knot"  . Nausea    occas vomiting    HPI:   [name] is a 84 y.o. female who presents for abdominal pain and nausea.  The patient was previously seen in our office 09/29/2013 for abnormal LFTs.  At that time he was also having right upper quadrant pain, nausea, vomiting.  At that time it was noted he recently started Lipitor (switch from Zocor due to weakness, leg pain).  Noted bump in LFTs which were previously normal, unremarkable noncontrast CT of the abdomen and pelvis.  Her LFTs improved several days after stopping Lipitor.  At that time she stated her pain was basically resolved and no further nausea and vomiting.  She was not interested in any further evaluation.  Recommended repeat liver enzymes if recurrence of pain, MRCP was recommended but she declined.  The patient was admitted to the hospital 11/11/2015 through 11/18/2015 for pancreatitis.  She presented due to abdominal pain, nausea, vomiting.  Found to have elevated lipase and CT showed dilated common bile duct with MRCP showing it stones were present in the common bile duct.  Positive blood cultures with mild sepsis.  She was treated with antibiotics and felt to have infective cholangitis.  Consultation obtained by GI and ERCP was performed with stone extraction.  She felt much better afterward and was tolerating a diet subsequently discharged home in satisfactory condition.  Today she is accompanied by her daughter. Today she states she's not doing great. Her symptoms have restarted around a year ago. Has started having waves of nausea episodes, occurs every day. Occurs at random. Not much vomiting. When she has vomited has not noted any blood. She has been having abdominal pain as well in the RUQ,  described as "crampy" and sore. Pain typically lasts 5-10 minutes, occurs at least once a week. Pain also on the left side as well (sometimes triggered by turning to look behind her). Pain is significant at times. Did have a single episodes of toilet tissue hematochezia while "very" constipated. Denies melena, fever, chills, significant unintentional weight loss. Her daughter states her appetite isn't as good as it used to be. Has chronic dizziness, at baseline. Denies chest pain, dyspnea, syncope, near syncope. Denies any other upper or lower GI symptoms.  Past Medical History:  Diagnosis Date  . Aneurysm ([place])    thoracic aorta  . Anxiety   . Breast cancer ([place]) 2003   treated with surgery and Arimadex  . COPD (chronic obstructive pulmonary disease) ([place])   . Coronary artery disease    2 stents per patient  . Depression   . Dilated bile duct    noted on CT 2009, ampulla looked normal on EGD  . Hypercholesteremia   . Hypertension   . Ischemic colitis ([place]) 2009  . PONV (postoperative nausea and vomiting)     Past Surgical History:  Procedure Laterality Date  . ABDOMINAL HYSTERECTOMY    . APPENDECTOMY    . BREAST SURGERY     left partial mastectomy 04/15/11, wide excision left breast  04/21/11, left partial mastectomy 12/19/02  . CHOLECYSTECTOMY    . COLONOSCOPY  05/2007   Dr. Perfecto Kingdom, ulcerated, edematous area at 35-40cm from anal verge bx c/w ischemic colitis, frequent sigm  tics, small internal hemrrhoids. SMA/IMA wide open on CT at that time.   . CORONARY ANGIOPLASTY WITH STENT PLACEMENT     in remote past, ?2001   . ERCP N/A 11/14/2015   Procedure: ENDOSCOPIC RETROGRADE CHOLANGIOPANCREATOGRAPHY (ERCP);  Surgeon: Daneil Dolin, MD;  Location: [facility];  Service: Gastroenterology;  Laterality: N/A;  . ESOPHAGOGASTRODUODENOSCOPY  05/2007   Dr. Malon Kindle ring, 2-3 cm HH, streaky erythema antrum with superficial erosions, bx gastritis without H.Pylori  . HERNIA  REPAIR    . SPHINCTEROTOMY N/A 11/14/2015   Procedure: SPHINCTEROTOMY;  Surgeon: Daneil Dolin, MD;  Location: [facility];  Service: Gastroenterology;  Laterality: N/A;  . SPYGLASS CHOLANGIOSCOPY N/A 11/14/2015   Procedure: MCNOBSJG CHOLANGIOSCOPY;  Surgeon: Daneil Dolin, MD;  Location: [facility];  Service: Gastroenterology;  Laterality: N/A;  Bess Kinds LITHOTRIPSY N/A 11/14/2015   Procedure: GEZMOQHU LITHOTRIPSY;  Surgeon: Daneil Dolin, MD;  Location: [facility];  Service: Gastroenterology;  Laterality: N/A;  . STONE EXTRACTION WITH BASKET N/A 11/14/2015   Procedure: STONE EXTRACTION WITH BASKET AND BALLOON;  Surgeon: Daneil Dolin, MD;  Location: [facility];  Service: Gastroenterology;  Laterality: N/A;    Current Outpatient Medications  Medication Sig Dispense Refill  . albuterol (PROVENTIL HFA;VENTOLIN HFA) 108 (90 BASE) MCG/ACT inhaler Inhale 2 puffs into the lungs 2 (two) times daily as needed. Shortness of Breath    . atenolol (TENORMIN) 25 MG tablet Take 25 mg by mouth every morning.     . escitalopram (LEXAPRO) 5 MG tablet Take 5 mg by mouth daily.      Marland Kitchen LORazepam (ATIVAN) 0.5 MG tablet Take 0.5 mg by mouth at bedtime.     . meclizine (ANTIVERT) 25 MG tablet Take 25 mg by mouth 3 (three) times daily as needed for dizziness.    . nitroGLYCERIN (NITROSTAT) 0.4 MG SL tablet Place 0.4 mg under the tongue every 5 (five) minutes as needed for chest pain.    . pantoprazole (PROTONIX) 40 MG tablet Take 40 mg by mouth daily.     . rosuvastatin (CRESTOR) 10 MG tablet Take 10 mg by mouth daily.    Marland Kitchen tiotropium (SPIRIVA) 18 MCG inhalation capsule Place 18 mcg into inhaler and inhale daily.     No current facility-administered medications for this visit.     Allergies as of 07/20/2017 - Review Complete 07/20/2017  Allergen Reaction Noted  . Ceftin [cefuroxime axetil] Anaphylaxis 11/02/2011  . Iohexol Hives and Shortness Of Breath 11/02/2011    Family History  Problem Relation Age of Onset  .  Breast cancer Daughter   . Breast cancer Mother   . Breast cancer Sister   . Colon cancer Neg Hx     Social History   Socioeconomic History  . Marital status: Widowed    Spouse name: Not on file  . Number of children: 3  . Years of education: Not on file  . Highest education level: Not on file  Occupational History  . Not on file  Social Needs  . Financial resource strain: Not on file  . Food insecurity:    Worry: Not on file    Inability: Not on file  . Transportation needs:    Medical: Not on file    Non-medical: Not on file  Tobacco Use  . Smoking status: Former Smoker    Last attempt to quit: 01/05/2015    Years since quitting: 2.5  . Smokeless tobacco: Never Used  Substance and Sexual Activity  .  Alcohol use: No  . Drug use: No  . Sexual activity: Yes    Birth control/protection: Post-menopausal, Surgical  Lifestyle  . Physical activity:    Days per week: Not on file    Minutes per session: Not on file  . Stress: Not on file  Relationships  . Social connections:    Talks on phone: Not on file    Gets together: Not on file    Attends religious service: Not on file    Active member of club or organization: Not on file    Attends meetings of clubs or organizations: Not on file    Relationship status: Not on file  Other Topics Concern  . Not on file  Social History Narrative  . Not on file    Review of Systems: Complete ROS negative except as per HPI.   Physical Exam: BP 109/66   Pulse 80   Temp (!) 97.5 F (36.4 C) (Oral)   Ht 5\' 4"  (1.626 m)   Wt 140 lb (63.5 kg)   BMI 24.03 kg/m  General:   Alert and oriented. Pleasant and cooperative. Well-nourished and well-developed.  Eyes:  Without icterus, sclera clear and conjunctiva pink.  Ears:  Normal auditory acuity. Cardiovascular:  S1, S2 present without murmurs appreciated. Extremities without clubbing or edema. Respiratory:  Clear to auscultation bilaterally. No wheezes, rales, or rhonchi. No  distress.  Gastrointestinal:  +BS, soft, and non-distended. Mild RUQ and right-sided TTP. No HSM noted. No guarding or rebound. No masses appreciated.  Rectal:  Deferred  Musculoskalatal:  Symmetrical without gross deformities. Skin:  Intact without significant lesions or rashes. Neurologic:  Alert and oriented x4;  grossly normal neurologically. Psych:  Alert and cooperative. Normal mood and affect. Heme/Lymph/Immune: No excessive bruising noted.    07/20/2017 1:19 PM   Disclaimer: This note was dictated with voice recognition software. Similar sounding words can inadvertently be transcribed and may not be corrected upon review.

## 2017-07-21 LAB — HEPATIC FUNCTION PANEL
ALT: 9 IU/L (ref 0–32)
AST: 15 IU/L (ref 0–40)
Albumin: 3.9 g/dL (ref 3.2–4.6)
Alkaline Phosphatase: 63 IU/L (ref 39–117)
Bilirubin Total: 0.5 mg/dL (ref 0.0–1.2)
Bilirubin, Direct: 0.14 mg/dL (ref 0.00–0.40)
Total Protein: 6.5 g/dL (ref 6.0–8.5)

## 2017-07-21 LAB — CBC WITH DIFFERENTIAL/PLATELET
BASOS ABS: 0 10*3/uL (ref 0.0–0.2)
Basos: 1 %
EOS (ABSOLUTE): 0.5 10*3/uL — ABNORMAL HIGH (ref 0.0–0.4)
Eos: 6 %
HEMOGLOBIN: 14.2 g/dL (ref 11.1–15.9)
Hematocrit: 42 % (ref 34.0–46.6)
IMMATURE GRANS (ABS): 0 10*3/uL (ref 0.0–0.1)
Immature Granulocytes: 0 %
LYMPHS ABS: 1.4 10*3/uL (ref 0.7–3.1)
LYMPHS: 16 %
MCH: 31.3 pg (ref 26.6–33.0)
MCHC: 33.8 g/dL (ref 31.5–35.7)
MCV: 93 fL (ref 79–97)
MONOCYTES: 9 %
Monocytes Absolute: 0.8 10*3/uL (ref 0.1–0.9)
NEUTROS PCT: 68 %
Neutrophils Absolute: 5.7 10*3/uL (ref 1.4–7.0)
Platelets: 138 10*3/uL — ABNORMAL LOW (ref 150–450)
RBC: 4.53 x10E6/uL (ref 3.77–5.28)
RDW: 13.8 % (ref 12.3–15.4)
WBC: 8.4 10*3/uL (ref 3.4–10.8)

## 2017-07-21 LAB — BASIC METABOLIC PANEL
BUN/Creatinine Ratio: 18 (ref 12–28)
BUN: 12 mg/dL (ref 10–36)
CALCIUM: 9.9 mg/dL (ref 8.7–10.3)
CO2: 26 mmol/L (ref 20–29)
Chloride: 102 mmol/L (ref 96–106)
Creatinine, Ser: 0.68 mg/dL (ref 0.57–1.00)
GFR calc Af Amer: 89 mL/min/{1.73_m2} (ref >59)
GFR calc non Af Amer: 77 mL/min/{1.73_m2} (ref >59)
GLUCOSE: 85 mg/dL (ref 65–99)
Potassium: 4.3 mmol/L (ref 3.5–5.2)
Sodium: 143 mmol/L (ref 134–144)

## 2017-07-21 LAB — LIPASE: LIPASE: 30 U/L (ref 14–85)

## 2017-07-23 ENCOUNTER — Ambulatory Visit (HOSPITAL_COMMUNITY)
Admission: RE | Admit: 2017-07-23 | Discharge: 2017-07-23 | Disposition: A | Discharge status: Home / Self Care | Payer: Medicare Other | Source: Ambulatory Visit | Attending: Nurse Practitioner | Admitting: Nurse Practitioner

## 2017-07-23 DIAGNOSIS — R112 Nausea with vomiting, unspecified: Secondary | ICD-10-CM | POA: Diagnosis not present

## 2017-07-23 DIAGNOSIS — K851 Biliary acute pancreatitis without necrosis or infection: Secondary | ICD-10-CM

## 2017-07-23 DIAGNOSIS — K838 Other specified diseases of biliary tract: Secondary | ICD-10-CM | POA: Diagnosis not present

## 2017-07-23 DIAGNOSIS — R1011 Right upper quadrant pain: Secondary | ICD-10-CM | POA: Insufficient documentation

## 2017-07-23 DIAGNOSIS — Z9049 Acquired absence of other specified parts of digestive tract: Secondary | ICD-10-CM | POA: Insufficient documentation

## 2017-07-28 ENCOUNTER — Telehealth: Payer: Self-pay | Admitting: Gastroenterology

## 2017-07-28 NOTE — Telephone Encounter (Signed)
Pt calling to see if her results are back yet. Please call [phone]

## 2017-07-28 NOTE — Telephone Encounter (Signed)
Patient called back again requesting results

## 2017-07-28 NOTE — Telephone Encounter (Signed)
Called pt. Left Vm that we have not received results from Walden Field, NP yet. We allow 7-10 business days to call with results. Please call me if you are having any problems. I will let Randall Hiss know that you have called and I will call when we hear from the results.

## 2017-07-29 NOTE — Progress Notes (Signed)
PT is aware.

## 2017-07-29 NOTE — Telephone Encounter (Signed)
Noted and I have spoke to pt.

## 2017-07-29 NOTE — Progress Notes (Signed)
Pt is aware.  

## 2017-07-29 NOTE — Telephone Encounter (Signed)
Please see result notes.  

## 2017-08-08 DIAGNOSIS — J449 Chronic obstructive pulmonary disease, unspecified: Secondary | ICD-10-CM | POA: Diagnosis not present

## 2017-08-31 DIAGNOSIS — J441 Chronic obstructive pulmonary disease with (acute) exacerbation: Secondary | ICD-10-CM | POA: Diagnosis not present

## 2017-08-31 DIAGNOSIS — I25119 Atherosclerotic heart disease of native coronary artery with unspecified angina pectoris: Secondary | ICD-10-CM | POA: Diagnosis not present

## 2017-08-31 DIAGNOSIS — J9611 Chronic respiratory failure with hypoxia: Secondary | ICD-10-CM | POA: Diagnosis not present

## 2017-09-08 DIAGNOSIS — J449 Chronic obstructive pulmonary disease, unspecified: Secondary | ICD-10-CM | POA: Diagnosis not present

## 2017-09-23 DIAGNOSIS — R2689 Other abnormalities of gait and mobility: Secondary | ICD-10-CM | POA: Diagnosis not present

## 2017-10-09 DIAGNOSIS — J449 Chronic obstructive pulmonary disease, unspecified: Secondary | ICD-10-CM | POA: Diagnosis not present

## 2017-10-20 ENCOUNTER — Ambulatory Visit: Payer: Medicare Other | Admitting: Nurse Practitioner

## 2017-10-28 ENCOUNTER — Emergency Department (HOSPITAL_COMMUNITY)
Admission: EM | Admit: 2017-10-28 | Discharge: 2017-10-28 | Disposition: A | Discharge status: Home / Self Care | Payer: Medicare Other | Attending: Emergency Medicine | Admitting: Emergency Medicine

## 2017-10-28 ENCOUNTER — Encounter (HOSPITAL_COMMUNITY): Payer: Self-pay | Admitting: Emergency Medicine

## 2017-10-28 ENCOUNTER — Emergency Department (HOSPITAL_COMMUNITY): Payer: Medicare Other

## 2017-10-28 ENCOUNTER — Other Ambulatory Visit: Payer: Self-pay

## 2017-10-28 DIAGNOSIS — I10 Essential (primary) hypertension: Secondary | ICD-10-CM | POA: Insufficient documentation

## 2017-10-28 DIAGNOSIS — I712 Thoracic aortic aneurysm, without rupture, unspecified: Secondary | ICD-10-CM

## 2017-10-28 DIAGNOSIS — R1013 Epigastric pain: Secondary | ICD-10-CM | POA: Diagnosis not present

## 2017-10-28 DIAGNOSIS — J449 Chronic obstructive pulmonary disease, unspecified: Secondary | ICD-10-CM | POA: Insufficient documentation

## 2017-10-28 DIAGNOSIS — Z87891 Personal history of nicotine dependence: Secondary | ICD-10-CM | POA: Insufficient documentation

## 2017-10-28 DIAGNOSIS — N200 Calculus of kidney: Secondary | ICD-10-CM | POA: Diagnosis not present

## 2017-10-28 DIAGNOSIS — Z955 Presence of coronary angioplasty implant and graft: Secondary | ICD-10-CM | POA: Insufficient documentation

## 2017-10-28 DIAGNOSIS — R110 Nausea: Secondary | ICD-10-CM | POA: Diagnosis not present

## 2017-10-28 DIAGNOSIS — R1084 Generalized abdominal pain: Secondary | ICD-10-CM

## 2017-10-28 DIAGNOSIS — I2510 Atherosclerotic heart disease of native coronary artery without angina pectoris: Secondary | ICD-10-CM | POA: Diagnosis not present

## 2017-10-28 LAB — CBC
HEMATOCRIT: 44.7 % (ref 36.0–46.0)
HEMOGLOBIN: 13.6 g/dL (ref 12.0–15.0)
MCH: 30.8 pg (ref 26.0–34.0)
MCHC: 30.4 g/dL (ref 30.0–36.0)
MCV: 101.4 fL — ABNORMAL HIGH (ref 80.0–100.0)
PLATELETS: 143 10*3/uL — AB (ref 150–400)
RBC: 4.41 MIL/uL (ref 3.87–5.11)
RDW: 13.3 % (ref 11.5–15.5)
WBC: 9 10*3/uL (ref 4.0–10.5)
nRBC: 0 % (ref 0.0–0.2)

## 2017-10-28 LAB — COMPREHENSIVE METABOLIC PANEL
ALBUMIN: 4 g/dL (ref 3.5–5.0)
ALK PHOS: 60 U/L (ref 38–126)
ALT: 11 U/L (ref 0–44)
AST: 15 U/L (ref 15–41)
Anion gap: 6 (ref 5–15)
BILIRUBIN TOTAL: 0.8 mg/dL (ref 0.3–1.2)
BUN: 12 mg/dL (ref 8–23)
CO2: 31 mmol/L (ref 22–32)
Calcium: 9.6 mg/dL (ref 8.9–10.3)
Chloride: 104 mmol/L (ref 98–111)
Creatinine, Ser: 0.67 mg/dL (ref 0.44–1.00)
GFR calc Af Amer: >60 mL/min (ref >60)
GFR calc non Af Amer: >60 mL/min (ref >60)
Glucose, Bld: 100 mg/dL — ABNORMAL HIGH (ref 70–99)
POTASSIUM: 4.2 mmol/L (ref 3.5–5.1)
Sodium: 141 mmol/L (ref 135–145)
TOTAL PROTEIN: 7.2 g/dL (ref 6.5–8.1)

## 2017-10-28 LAB — URINALYSIS, ROUTINE W REFLEX MICROSCOPIC
BILIRUBIN URINE: NEGATIVE
Glucose, UA: NEGATIVE mg/dL
HGB URINE DIPSTICK: NEGATIVE
KETONES UR: NEGATIVE mg/dL
Leukocytes, UA: NEGATIVE
NITRITE: NEGATIVE
Protein, ur: NEGATIVE mg/dL
SPECIFIC GRAVITY, URINE: 1.008 (ref 1.005–1.030)
pH: 9 — ABNORMAL HIGH (ref 5.0–8.0)

## 2017-10-28 LAB — LIPASE, BLOOD: Lipase: 33 U/L (ref 11–51)

## 2017-10-28 MED ORDER — ONDANSETRON 4 MG PO TBDP
4.0000 mg | ORAL_TABLET | Freq: Once | ORAL | Status: AC | PRN
  Administered 2017-10-28: 4 mg via ORAL

## 2017-10-28 MED ORDER — ONDANSETRON 4 MG PO TBDP: 4.0000 mg | ORAL_TABLET | Freq: Three times a day (TID) | ORAL | 0 refills | Status: AC | PRN

## 2017-10-28 MED ORDER — ONDANSETRON 4 MG PO TBDP
ORAL_TABLET | ORAL | Status: AC
  Filled 2017-10-28: qty 1

## 2017-10-28 NOTE — ED Triage Notes (Signed)
Pt c/o of abdominal pain, nausea, diarrhea x 1 week.  HX of pancreatic stones and was told by PCP to be seen.

## 2017-10-28 NOTE — Discharge Instructions (Signed)
You have been seen in the Emergency Department (ED) for abdominal pain.  Your evaluation did not identify a clear cause of your symptoms but was generally reassuring.  You were found to have an aneurysm in the thoracic aorta. The Radiologist is recommending follow up in 6 months for repeat imaging. Discuss this with Dr. Luan Pulling and he can provide a referral to cardiothoracic surgery as needed.   Please follow up as instructed above regarding todays emergent visit and the symptoms that are bothering you.  Return to the ED if your abdominal pain worsens or fails to improve, you develop bloody vomiting, bloody diarrhea, you are unable to tolerate fluids due to vomiting, fever greater than 101, or other symptoms that concern you.

## 2017-10-28 NOTE — ED Provider Notes (Signed)
Emergency Department Provider Note   I have reviewed the triage vital signs and the nursing notes.   HISTORY  Chief Complaint Abdominal Pain   HPI [name] is a 84 y.o. female presents to the ED from the PCP office with weeks of abdominal pain and nausea. Pain is diffuse and slightly worse in the epigastric area. She notes intermittent waves of nausea and pain. Some associated noted with foot but not consistently. No chest pain or SOB. No fever/chills. No lower abdominal pain. No other modifying factors.   Past Medical History:  Diagnosis Date  . Aneurysm ([place])    thoracic aorta  . Anxiety   . Breast cancer ([place]) 2003   treated with surgery and Arimadex  . COPD (chronic obstructive pulmonary disease) ([place])   . Coronary artery disease    2 stents per patient  . Depression   . Dilated bile duct    noted on CT 2009, ampulla looked normal on EGD  . Hypercholesteremia   . Hypertension   . Ischemic colitis ([place]) 2009  . PONV (postoperative nausea and vomiting)     Patient Active Problem List   Diagnosis Date Noted  . Sepsis ([place]) 11/15/2015  . Bacteremia due to Klebsiella pneumoniae 11/15/2015  . Elevated lipase   . Jaundice of recent onset   . Nausea and vomiting   . Gall stones, common bile duct 11/14/2015  . Cholangitis due to bile duct calculus with obstruction 11/14/2015  . Calculus of bile duct with cholangitis and obstruction   . Elevated LFTs   . Hypertension 11/11/2015  . Hyperbilirubinemia 11/11/2015  . Hyponatremia 11/11/2015  . Pancreatitis 11/11/2015  . Abdominal pain 11/11/2015  . GERD (gastroesophageal reflux disease) 11/11/2015  . Generalized weakness 01/13/2015  . Chronic respiratory failure with hypoxia ([place]) 01/13/2015  . D-dimer, elevated 01/13/2015  . Abnormal LFTs 09/29/2013  . Clostridium difficile colitis 11/06/2011  . COPD (chronic obstructive pulmonary disease) ([place]) 11/06/2011  . Coronary atherosclerosis 11/06/2011  . Anxiety  11/06/2011  . Depression 11/06/2011    Past Surgical History:  Procedure Laterality Date  . ABDOMINAL HYSTERECTOMY    . APPENDECTOMY    . BREAST SURGERY     left partial mastectomy 04/15/11, wide excision left breast  04/21/11, left partial mastectomy 12/19/02  . CHOLECYSTECTOMY    . COLONOSCOPY  05/2007   Dr. Perfecto Kingdom, ulcerated, edematous area at 35-40cm from anal verge bx c/w ischemic colitis, frequent sigm tics, small internal hemrrhoids. SMA/IMA wide open on CT at that time.   . CORONARY ANGIOPLASTY WITH STENT PLACEMENT     in remote past, ?2001   . ERCP N/A 11/14/2015   Procedure: ENDOSCOPIC RETROGRADE CHOLANGIOPANCREATOGRAPHY (ERCP);  Surgeon: Daneil Dolin, MD;  Location: [facility];  Service: Gastroenterology;  Laterality: N/A;  . ESOPHAGOGASTRODUODENOSCOPY  05/2007   Dr. Malon Kindle ring, 2-3 cm HH, streaky erythema antrum with superficial erosions, bx gastritis without H.Pylori  . HERNIA REPAIR    . SPHINCTEROTOMY N/A 11/14/2015   Procedure: SPHINCTEROTOMY;  Surgeon: Daneil Dolin, MD;  Location: [facility];  Service: Gastroenterology;  Laterality: N/A;  . SPYGLASS CHOLANGIOSCOPY N/A 11/14/2015   Procedure: XBLTJQZE CHOLANGIOSCOPY;  Surgeon: Daneil Dolin, MD;  Location: [facility];  Service: Gastroenterology;  Laterality: N/A;  Bess Kinds LITHOTRIPSY N/A 11/14/2015   Procedure: SPQZRAQT LITHOTRIPSY;  Surgeon: Daneil Dolin, MD;  Location: [facility];  Service: Gastroenterology;  Laterality: N/A;  . STONE EXTRACTION WITH BASKET N/A 11/14/2015   Procedure: STONE EXTRACTION  WITH BASKET AND BALLOON;  Surgeon: Daneil Dolin, MD;  Location: [facility];  Service: Gastroenterology;  Laterality: N/A;    Allergies Ceftin [cefuroxime axetil] and Iohexol  Family History  Problem Relation Age of Onset  . Breast cancer Daughter   . Breast cancer Mother   . Breast cancer Sister   . Colon cancer Neg Hx     Social History Social History   Tobacco Use  . Smoking status: Former  Smoker    Last attempt to quit: 01/05/2015    Years since quitting: 2.8  . Smokeless tobacco: Never Used  Substance Use Topics  . Alcohol use: No  . Drug use: No    Review of Systems  Constitutional: No fever/chills Eyes: No visual changes. ENT: No sore throat. Cardiovascular: Denies chest pain. Respiratory: Denies shortness of breath. Gastrointestinal: Positive abdominal pain. Positive nausea, no vomiting.  No diarrhea.  No constipation. Genitourinary: Negative for dysuria. Musculoskeletal: Negative for back pain. Skin: Negative for rash. Neurological: Negative for headaches, focal weakness or numbness.  10-point ROS otherwise negative.  ____________________________________________   PHYSICAL EXAM:  VITAL SIGNS: ED Triage Vitals  Enc Vitals Group     BP 10/28/17 1740 (!) 146/80     Pulse Rate 10/28/17 1740 70     Resp 10/28/17 1740 20     Temp 10/28/17 1740 98.3 F (36.8 C)     Temp Source 10/28/17 1740 Oral     SpO2 10/28/17 1740 95 %     Weight 10/28/17 1741 140 lb (63.5 kg)     Height 10/28/17 1741 5\' 5"  (1.651 m)     Pain Score 10/28/17 1742 6   Constitutional: Alert and oriented. Well appearing and in no acute distress. Eyes: Conjunctivae are normal. Head: Atraumatic. Nose: No congestion/rhinnorhea. Mouth/Throat: Mucous membranes are moist.  Oropharynx non-erythematous. Neck: No stridor.   Cardiovascular: Normal rate, regular rhythm. Good peripheral circulation. Grossly normal heart sounds.   Respiratory: Normal respiratory effort.  No retractions. Lungs CTAB. Gastrointestinal: Soft with mild diffuse discomfort. No focal tenderness. No distention.  Musculoskeletal: No lower extremity tenderness nor edema. No gross deformities of extremities. Neurologic:  Normal speech and language. No gross focal neurologic deficits are appreciated.  Skin:  Skin is warm, dry and intact. No rash noted.  ____________________________________________   LABS (all labs  ordered are listed, but only abnormal results are displayed)  Labs Reviewed  COMPREHENSIVE METABOLIC PANEL - Abnormal; Notable for the following components:      Result Value   Glucose, Bld 100 (*)    All other components within normal limits  CBC - Abnormal; Notable for the following components:   MCV 101.4 (*)    Platelets 143 (*)    All other components within normal limits  URINALYSIS, ROUTINE W REFLEX MICROSCOPIC - Abnormal; Notable for the following components:   Color, Urine STRAW (*)    pH 9.0 (*)    All other components within normal limits  LIPASE, BLOOD   ____________________________________________  RADIOLOGY  Ct Abdomen Pelvis Wo Contrast  Result Date: 10/28/2017 CLINICAL DATA:  Abdominal pain, nausea and diarrhea, 1 week duration. History pancreatic stones. History of ischemic colitis. EXAM: CT ABDOMEN AND PELVIS WITHOUT CONTRAST TECHNIQUE: Multidetector CT imaging of the abdomen and pelvis was performed following the standard protocol without IV contrast. COMPARISON:  09/15/2013 CT.  11/12/2015 MRI. FINDINGS: Lower chest: Chronic scarring at the lung bases. Thoracic aortic aneurysm measuring up to 5 cm. In this same location in 2015, maximal  diameter was 4.4 cm. Hepatobiliary: Liver parenchyma is normal. Previous cholecystectomy. Air in the biliary tree related to previous sphincterotomy. No sign of ductal dilatation or hyperdense stone in the common bile duct. Pancreas: Normal Spleen: Normal Adrenals/Urinary Tract: Adrenal glands are normal. The right kidney contains multiple small nonobstructing calculi, the largest 5-6 mm in size. No evidence of passing stone on the right. No stone in the bladder. The left kidney shows a chronic 1.9 cm cyst at the lower pole projecting laterally. There has been enlargement of a somewhat irregular cluster of cysts in the ventral midportion, measuring up to 5.2 cm in diameter. Few small nonobstructing stones in this kidney. Stomach/Bowel: No  sign of acute bowel pathology. No sign of obstruction. No liquid stool identified. No sign of bowel inflammation or ischemia. The patient has some diverticulosis in the sigmoid region but no evidence of diverticulitis. Vascular/Lymphatic: Aortic atherosclerosis. No aneurysm in the abdominal region. Reproductive: No pelvic mass.  Previous hysterectomy. Other: No free fluid or air. Musculoskeletal: Ordinary lower lumbar degenerative changes. IMPRESSION: No acute finding to explain the clinical presentation. The bowel does not show any evidence of obstruction. No liquid stool is identifiable. No evidence of focal bowel pathology of significance. Thoracic aortic aneurysm. Maximal diameter 5 cm, enlarged from 4.4 cm in 2015. The entire aorta was not imaged. Based on this measurement, Recommend semi-annual imaging followup by CTA or MRA and referral to cardiothoracic surgery if not already obtained. This recommendation follows 2010 ACCF/AHA/AATS/ACR/ASA/SCA/SCAI/SIR/STS/SVM Guidelines for the Diagnosis and Management of Patients With Thoracic Aortic Disease. Circulation. 2010; 121: e266-e36 Multiple nonobstructing renal calculi. Multiple renal cysts, slowly enlarging over time. Electronically Signed   By: Nelson Chimes M.D.   On: 10/28/2017 21:49    ____________________________________________   PROCEDURES  Procedure(s) performed:   Procedures  None ____________________________________________   INITIAL IMPRESSION / ASSESSMENT AND PLAN / ED COURSE  Pertinent labs & imaging results that were available during my care of the patient were reviewed by me and considered in my medical decision making (see chart for details).  Patient referred to ED by PCP today with abdominal pain and nausea. Exam is unremarkable. Labs with no acute findings and CT w/o contrast shows no acute findings. We did discuss the thoracic AAA and need for semi-annual follow up. She will discuss with PCP. No evidence to suggest that  this AAA is causing her current symptoms. Plan for GI follow up as well. Patient is feeling better after Zofran in the ED.   At this time, I do not feel there is any life-threatening condition present. I have reviewed and discussed all results (EKG, imaging, lab, urine as appropriate), exam findings with patient. I have reviewed nursing notes and appropriate previous records.  I feel the patient is safe to be discharged home without further emergent workup. Discussed usual and customary return precautions. Patient and family (if present) verbalize understanding and are comfortable with this plan.  Patient will follow-up with their primary care provider. If they do not have a primary care provider, information for follow-up has been provided to them. All questions have been answered.    ____________________________________________  FINAL CLINICAL IMPRESSION(S) / ED DIAGNOSES  Final diagnoses:  Generalized abdominal pain  Nausea  Thoracic aortic aneurysm without rupture (HCC)     MEDICATIONS GIVEN DURING THIS VISIT:  Medications  ondansetron (ZOFRAN-ODT) disintegrating tablet 4 mg (4 mg Oral Given 10/28/17 1747)     NEW OUTPATIENT MEDICATIONS STARTED DURING THIS VISIT:  Discharge Medication List  as of 10/28/2017 10:47 PM    START taking these medications   Details  ondansetron (ZOFRAN ODT) 4 MG disintegrating tablet Take 1 tablet (4 mg total) by mouth every 8 (eight) hours as needed for nausea or vomiting., Starting Thu 10/28/2017, Normal        Note:  This document was prepared using Dragon voice recognition software and may include unintentional dictation errors.  Nanda Quinton, MD Emergency Medicine    Long, [facility], MD 10/29/17 [phone]

## 2017-12-02 DIAGNOSIS — I10 Essential (primary) hypertension: Secondary | ICD-10-CM | POA: Diagnosis not present

## 2017-12-02 DIAGNOSIS — I739 Peripheral vascular disease, unspecified: Secondary | ICD-10-CM | POA: Diagnosis not present

## 2017-12-02 DIAGNOSIS — I25119 Atherosclerotic heart disease of native coronary artery with unspecified angina pectoris: Secondary | ICD-10-CM | POA: Diagnosis not present

## 2017-12-02 DIAGNOSIS — J449 Chronic obstructive pulmonary disease, unspecified: Secondary | ICD-10-CM | POA: Diagnosis not present

## 2018-01-24 DIAGNOSIS — Z9981 Dependence on supplemental oxygen: Secondary | ICD-10-CM | POA: Diagnosis not present

## 2018-01-24 DIAGNOSIS — J441 Chronic obstructive pulmonary disease with (acute) exacerbation: Secondary | ICD-10-CM | POA: Diagnosis not present

## 2018-01-24 DIAGNOSIS — I739 Peripheral vascular disease, unspecified: Secondary | ICD-10-CM | POA: Diagnosis not present

## 2018-01-24 DIAGNOSIS — I10 Essential (primary) hypertension: Secondary | ICD-10-CM | POA: Diagnosis not present

## 2018-01-24 DIAGNOSIS — E785 Hyperlipidemia, unspecified: Secondary | ICD-10-CM | POA: Diagnosis not present

## 2018-01-24 DIAGNOSIS — I25119 Atherosclerotic heart disease of native coronary artery with unspecified angina pectoris: Secondary | ICD-10-CM | POA: Diagnosis not present

## 2018-01-24 DIAGNOSIS — J9611 Chronic respiratory failure with hypoxia: Secondary | ICD-10-CM | POA: Diagnosis not present

## 2018-01-24 DIAGNOSIS — Z7951 Long term (current) use of inhaled steroids: Secondary | ICD-10-CM | POA: Diagnosis not present

## 2018-01-24 DIAGNOSIS — J449 Chronic obstructive pulmonary disease, unspecified: Secondary | ICD-10-CM | POA: Diagnosis not present

## 2018-01-25 DIAGNOSIS — I10 Essential (primary) hypertension: Secondary | ICD-10-CM | POA: Diagnosis not present

## 2018-01-25 DIAGNOSIS — Z9981 Dependence on supplemental oxygen: Secondary | ICD-10-CM | POA: Diagnosis not present

## 2018-01-25 DIAGNOSIS — I739 Peripheral vascular disease, unspecified: Secondary | ICD-10-CM | POA: Diagnosis not present

## 2018-01-25 DIAGNOSIS — E785 Hyperlipidemia, unspecified: Secondary | ICD-10-CM | POA: Diagnosis not present

## 2018-01-25 DIAGNOSIS — I25119 Atherosclerotic heart disease of native coronary artery with unspecified angina pectoris: Secondary | ICD-10-CM | POA: Diagnosis not present

## 2018-01-25 DIAGNOSIS — J9611 Chronic respiratory failure with hypoxia: Secondary | ICD-10-CM | POA: Diagnosis not present

## 2018-01-25 DIAGNOSIS — Z7951 Long term (current) use of inhaled steroids: Secondary | ICD-10-CM | POA: Diagnosis not present

## 2018-01-25 DIAGNOSIS — J449 Chronic obstructive pulmonary disease, unspecified: Secondary | ICD-10-CM | POA: Diagnosis not present

## 2018-01-27 DIAGNOSIS — I10 Essential (primary) hypertension: Secondary | ICD-10-CM | POA: Diagnosis not present

## 2018-01-27 DIAGNOSIS — Z7951 Long term (current) use of inhaled steroids: Secondary | ICD-10-CM | POA: Diagnosis not present

## 2018-01-27 DIAGNOSIS — I25119 Atherosclerotic heart disease of native coronary artery with unspecified angina pectoris: Secondary | ICD-10-CM | POA: Diagnosis not present

## 2018-01-27 DIAGNOSIS — Z9981 Dependence on supplemental oxygen: Secondary | ICD-10-CM | POA: Diagnosis not present

## 2018-01-27 DIAGNOSIS — E785 Hyperlipidemia, unspecified: Secondary | ICD-10-CM | POA: Diagnosis not present

## 2018-01-27 DIAGNOSIS — J9611 Chronic respiratory failure with hypoxia: Secondary | ICD-10-CM | POA: Diagnosis not present

## 2018-01-27 DIAGNOSIS — I739 Peripheral vascular disease, unspecified: Secondary | ICD-10-CM | POA: Diagnosis not present

## 2018-01-27 DIAGNOSIS — J449 Chronic obstructive pulmonary disease, unspecified: Secondary | ICD-10-CM | POA: Diagnosis not present

## 2018-01-31 DIAGNOSIS — I739 Peripheral vascular disease, unspecified: Secondary | ICD-10-CM | POA: Diagnosis not present

## 2018-01-31 DIAGNOSIS — I10 Essential (primary) hypertension: Secondary | ICD-10-CM | POA: Diagnosis not present

## 2018-01-31 DIAGNOSIS — J449 Chronic obstructive pulmonary disease, unspecified: Secondary | ICD-10-CM | POA: Diagnosis not present

## 2018-01-31 DIAGNOSIS — J9611 Chronic respiratory failure with hypoxia: Secondary | ICD-10-CM | POA: Diagnosis not present

## 2018-01-31 DIAGNOSIS — I25119 Atherosclerotic heart disease of native coronary artery with unspecified angina pectoris: Secondary | ICD-10-CM | POA: Diagnosis not present

## 2018-01-31 DIAGNOSIS — Z9981 Dependence on supplemental oxygen: Secondary | ICD-10-CM | POA: Diagnosis not present

## 2018-01-31 DIAGNOSIS — E785 Hyperlipidemia, unspecified: Secondary | ICD-10-CM | POA: Diagnosis not present

## 2018-01-31 DIAGNOSIS — Z7951 Long term (current) use of inhaled steroids: Secondary | ICD-10-CM | POA: Diagnosis not present

## 2018-02-03 DIAGNOSIS — I739 Peripheral vascular disease, unspecified: Secondary | ICD-10-CM | POA: Diagnosis not present

## 2018-02-03 DIAGNOSIS — J9611 Chronic respiratory failure with hypoxia: Secondary | ICD-10-CM | POA: Diagnosis not present

## 2018-02-03 DIAGNOSIS — E785 Hyperlipidemia, unspecified: Secondary | ICD-10-CM | POA: Diagnosis not present

## 2018-02-03 DIAGNOSIS — J449 Chronic obstructive pulmonary disease, unspecified: Secondary | ICD-10-CM | POA: Diagnosis not present

## 2018-02-03 DIAGNOSIS — I10 Essential (primary) hypertension: Secondary | ICD-10-CM | POA: Diagnosis not present

## 2018-02-03 DIAGNOSIS — Z7951 Long term (current) use of inhaled steroids: Secondary | ICD-10-CM | POA: Diagnosis not present

## 2018-02-03 DIAGNOSIS — Z9981 Dependence on supplemental oxygen: Secondary | ICD-10-CM | POA: Diagnosis not present

## 2018-02-03 DIAGNOSIS — I25119 Atherosclerotic heart disease of native coronary artery with unspecified angina pectoris: Secondary | ICD-10-CM | POA: Diagnosis not present

## 2018-02-04 DIAGNOSIS — E785 Hyperlipidemia, unspecified: Secondary | ICD-10-CM | POA: Diagnosis not present

## 2018-02-04 DIAGNOSIS — I10 Essential (primary) hypertension: Secondary | ICD-10-CM | POA: Diagnosis not present

## 2018-02-04 DIAGNOSIS — J449 Chronic obstructive pulmonary disease, unspecified: Secondary | ICD-10-CM | POA: Diagnosis not present

## 2018-02-04 DIAGNOSIS — I25119 Atherosclerotic heart disease of native coronary artery with unspecified angina pectoris: Secondary | ICD-10-CM | POA: Diagnosis not present

## 2018-02-04 DIAGNOSIS — Z7951 Long term (current) use of inhaled steroids: Secondary | ICD-10-CM | POA: Diagnosis not present

## 2018-02-04 DIAGNOSIS — J9611 Chronic respiratory failure with hypoxia: Secondary | ICD-10-CM | POA: Diagnosis not present

## 2018-02-04 DIAGNOSIS — I739 Peripheral vascular disease, unspecified: Secondary | ICD-10-CM | POA: Diagnosis not present

## 2018-02-04 DIAGNOSIS — Z9981 Dependence on supplemental oxygen: Secondary | ICD-10-CM | POA: Diagnosis not present

## 2018-02-07 DIAGNOSIS — J449 Chronic obstructive pulmonary disease, unspecified: Secondary | ICD-10-CM | POA: Diagnosis not present

## 2018-02-07 DIAGNOSIS — Z7951 Long term (current) use of inhaled steroids: Secondary | ICD-10-CM | POA: Diagnosis not present

## 2018-02-07 DIAGNOSIS — I25119 Atherosclerotic heart disease of native coronary artery with unspecified angina pectoris: Secondary | ICD-10-CM | POA: Diagnosis not present

## 2018-02-07 DIAGNOSIS — I739 Peripheral vascular disease, unspecified: Secondary | ICD-10-CM | POA: Diagnosis not present

## 2018-02-07 DIAGNOSIS — Z9981 Dependence on supplemental oxygen: Secondary | ICD-10-CM | POA: Diagnosis not present

## 2018-02-07 DIAGNOSIS — I10 Essential (primary) hypertension: Secondary | ICD-10-CM | POA: Diagnosis not present

## 2018-02-07 DIAGNOSIS — J9611 Chronic respiratory failure with hypoxia: Secondary | ICD-10-CM | POA: Diagnosis not present

## 2018-02-07 DIAGNOSIS — E785 Hyperlipidemia, unspecified: Secondary | ICD-10-CM | POA: Diagnosis not present

## 2018-02-08 DIAGNOSIS — J9611 Chronic respiratory failure with hypoxia: Secondary | ICD-10-CM | POA: Diagnosis not present

## 2018-02-08 DIAGNOSIS — I10 Essential (primary) hypertension: Secondary | ICD-10-CM | POA: Diagnosis not present

## 2018-02-08 DIAGNOSIS — Z7951 Long term (current) use of inhaled steroids: Secondary | ICD-10-CM | POA: Diagnosis not present

## 2018-02-08 DIAGNOSIS — I25119 Atherosclerotic heart disease of native coronary artery with unspecified angina pectoris: Secondary | ICD-10-CM | POA: Diagnosis not present

## 2018-02-08 DIAGNOSIS — I739 Peripheral vascular disease, unspecified: Secondary | ICD-10-CM | POA: Diagnosis not present

## 2018-02-08 DIAGNOSIS — J449 Chronic obstructive pulmonary disease, unspecified: Secondary | ICD-10-CM | POA: Diagnosis not present

## 2018-02-08 DIAGNOSIS — E785 Hyperlipidemia, unspecified: Secondary | ICD-10-CM | POA: Diagnosis not present

## 2018-02-08 DIAGNOSIS — Z9981 Dependence on supplemental oxygen: Secondary | ICD-10-CM | POA: Diagnosis not present

## 2018-02-10 DIAGNOSIS — I25119 Atherosclerotic heart disease of native coronary artery with unspecified angina pectoris: Secondary | ICD-10-CM | POA: Diagnosis not present

## 2018-02-10 DIAGNOSIS — I10 Essential (primary) hypertension: Secondary | ICD-10-CM | POA: Diagnosis not present

## 2018-02-10 DIAGNOSIS — Z7951 Long term (current) use of inhaled steroids: Secondary | ICD-10-CM | POA: Diagnosis not present

## 2018-02-10 DIAGNOSIS — E785 Hyperlipidemia, unspecified: Secondary | ICD-10-CM | POA: Diagnosis not present

## 2018-02-10 DIAGNOSIS — J449 Chronic obstructive pulmonary disease, unspecified: Secondary | ICD-10-CM | POA: Diagnosis not present

## 2018-02-10 DIAGNOSIS — Z9981 Dependence on supplemental oxygen: Secondary | ICD-10-CM | POA: Diagnosis not present

## 2018-02-10 DIAGNOSIS — J9611 Chronic respiratory failure with hypoxia: Secondary | ICD-10-CM | POA: Diagnosis not present

## 2018-02-10 DIAGNOSIS — I739 Peripheral vascular disease, unspecified: Secondary | ICD-10-CM | POA: Diagnosis not present

## 2018-02-15 DIAGNOSIS — I739 Peripheral vascular disease, unspecified: Secondary | ICD-10-CM | POA: Diagnosis not present

## 2018-02-15 DIAGNOSIS — E785 Hyperlipidemia, unspecified: Secondary | ICD-10-CM | POA: Diagnosis not present

## 2018-02-15 DIAGNOSIS — J9611 Chronic respiratory failure with hypoxia: Secondary | ICD-10-CM | POA: Diagnosis not present

## 2018-02-15 DIAGNOSIS — Z9981 Dependence on supplemental oxygen: Secondary | ICD-10-CM | POA: Diagnosis not present

## 2018-02-15 DIAGNOSIS — J449 Chronic obstructive pulmonary disease, unspecified: Secondary | ICD-10-CM | POA: Diagnosis not present

## 2018-02-15 DIAGNOSIS — I25119 Atherosclerotic heart disease of native coronary artery with unspecified angina pectoris: Secondary | ICD-10-CM | POA: Diagnosis not present

## 2018-02-15 DIAGNOSIS — I10 Essential (primary) hypertension: Secondary | ICD-10-CM | POA: Diagnosis not present

## 2018-02-15 DIAGNOSIS — Z7951 Long term (current) use of inhaled steroids: Secondary | ICD-10-CM | POA: Diagnosis not present

## 2018-02-16 DIAGNOSIS — I25119 Atherosclerotic heart disease of native coronary artery with unspecified angina pectoris: Secondary | ICD-10-CM | POA: Diagnosis not present

## 2018-02-16 DIAGNOSIS — J9611 Chronic respiratory failure with hypoxia: Secondary | ICD-10-CM | POA: Diagnosis not present

## 2018-02-16 DIAGNOSIS — I10 Essential (primary) hypertension: Secondary | ICD-10-CM | POA: Diagnosis not present

## 2018-02-16 DIAGNOSIS — J449 Chronic obstructive pulmonary disease, unspecified: Secondary | ICD-10-CM | POA: Diagnosis not present

## 2018-02-18 DIAGNOSIS — J449 Chronic obstructive pulmonary disease, unspecified: Secondary | ICD-10-CM | POA: Diagnosis not present

## 2018-02-21 DIAGNOSIS — I25119 Atherosclerotic heart disease of native coronary artery with unspecified angina pectoris: Secondary | ICD-10-CM | POA: Diagnosis not present

## 2018-02-21 DIAGNOSIS — I739 Peripheral vascular disease, unspecified: Secondary | ICD-10-CM | POA: Diagnosis not present

## 2018-02-21 DIAGNOSIS — I10 Essential (primary) hypertension: Secondary | ICD-10-CM | POA: Diagnosis not present

## 2018-02-21 DIAGNOSIS — J9611 Chronic respiratory failure with hypoxia: Secondary | ICD-10-CM | POA: Diagnosis not present

## 2018-02-21 DIAGNOSIS — E785 Hyperlipidemia, unspecified: Secondary | ICD-10-CM | POA: Diagnosis not present

## 2018-02-21 DIAGNOSIS — Z9981 Dependence on supplemental oxygen: Secondary | ICD-10-CM | POA: Diagnosis not present

## 2018-02-21 DIAGNOSIS — J449 Chronic obstructive pulmonary disease, unspecified: Secondary | ICD-10-CM | POA: Diagnosis not present

## 2018-02-21 DIAGNOSIS — Z7951 Long term (current) use of inhaled steroids: Secondary | ICD-10-CM | POA: Diagnosis not present

## 2018-03-01 DIAGNOSIS — I739 Peripheral vascular disease, unspecified: Secondary | ICD-10-CM | POA: Diagnosis not present

## 2018-03-01 DIAGNOSIS — E785 Hyperlipidemia, unspecified: Secondary | ICD-10-CM | POA: Diagnosis not present

## 2018-03-01 DIAGNOSIS — Z7951 Long term (current) use of inhaled steroids: Secondary | ICD-10-CM | POA: Diagnosis not present

## 2018-03-01 DIAGNOSIS — I25119 Atherosclerotic heart disease of native coronary artery with unspecified angina pectoris: Secondary | ICD-10-CM | POA: Diagnosis not present

## 2018-03-01 DIAGNOSIS — J449 Chronic obstructive pulmonary disease, unspecified: Secondary | ICD-10-CM | POA: Diagnosis not present

## 2018-03-01 DIAGNOSIS — J9611 Chronic respiratory failure with hypoxia: Secondary | ICD-10-CM | POA: Diagnosis not present

## 2018-03-01 DIAGNOSIS — Z9981 Dependence on supplemental oxygen: Secondary | ICD-10-CM | POA: Diagnosis not present

## 2018-03-01 DIAGNOSIS — I10 Essential (primary) hypertension: Secondary | ICD-10-CM | POA: Diagnosis not present

## 2018-03-19 DIAGNOSIS — J449 Chronic obstructive pulmonary disease, unspecified: Secondary | ICD-10-CM | POA: Diagnosis not present

## 2018-04-19 DIAGNOSIS — J449 Chronic obstructive pulmonary disease, unspecified: Secondary | ICD-10-CM | POA: Diagnosis not present

## 2018-05-05 DIAGNOSIS — I2510 Atherosclerotic heart disease of native coronary artery without angina pectoris: Secondary | ICD-10-CM | POA: Diagnosis not present

## 2018-05-05 DIAGNOSIS — J449 Chronic obstructive pulmonary disease, unspecified: Secondary | ICD-10-CM | POA: Diagnosis not present

## 2018-05-05 DIAGNOSIS — K5900 Constipation, unspecified: Secondary | ICD-10-CM | POA: Diagnosis not present

## 2018-05-05 DIAGNOSIS — J9611 Chronic respiratory failure with hypoxia: Secondary | ICD-10-CM | POA: Diagnosis not present

## 2018-05-19 DIAGNOSIS — J449 Chronic obstructive pulmonary disease, unspecified: Secondary | ICD-10-CM | POA: Diagnosis not present

## 2018-05-19 DIAGNOSIS — I25119 Atherosclerotic heart disease of native coronary artery with unspecified angina pectoris: Secondary | ICD-10-CM | POA: Diagnosis not present

## 2018-05-19 DIAGNOSIS — J441 Chronic obstructive pulmonary disease with (acute) exacerbation: Secondary | ICD-10-CM | POA: Diagnosis not present

## 2018-05-19 DIAGNOSIS — J9611 Chronic respiratory failure with hypoxia: Secondary | ICD-10-CM | POA: Diagnosis not present

## 2018-06-19 DIAGNOSIS — J449 Chronic obstructive pulmonary disease, unspecified: Secondary | ICD-10-CM | POA: Diagnosis not present

## 2018-06-28 DIAGNOSIS — I2510 Atherosclerotic heart disease of native coronary artery without angina pectoris: Secondary | ICD-10-CM | POA: Diagnosis not present

## 2018-06-28 DIAGNOSIS — J9611 Chronic respiratory failure with hypoxia: Secondary | ICD-10-CM | POA: Diagnosis not present

## 2018-06-28 DIAGNOSIS — H6000 Abscess of external ear, unspecified ear: Secondary | ICD-10-CM | POA: Diagnosis not present

## 2018-06-28 DIAGNOSIS — J449 Chronic obstructive pulmonary disease, unspecified: Secondary | ICD-10-CM | POA: Diagnosis not present

## 2018-07-07 DIAGNOSIS — J449 Chronic obstructive pulmonary disease, unspecified: Secondary | ICD-10-CM | POA: Diagnosis not present

## 2018-07-07 DIAGNOSIS — J9611 Chronic respiratory failure with hypoxia: Secondary | ICD-10-CM | POA: Diagnosis not present

## 2018-07-07 DIAGNOSIS — I2510 Atherosclerotic heart disease of native coronary artery without angina pectoris: Secondary | ICD-10-CM | POA: Diagnosis not present

## 2018-07-12 ENCOUNTER — Other Ambulatory Visit: Payer: Self-pay

## 2018-07-12 DIAGNOSIS — J9611 Chronic respiratory failure with hypoxia: Secondary | ICD-10-CM | POA: Diagnosis not present

## 2018-07-12 DIAGNOSIS — I2510 Atherosclerotic heart disease of native coronary artery without angina pectoris: Secondary | ICD-10-CM | POA: Diagnosis not present

## 2018-07-12 DIAGNOSIS — I10 Essential (primary) hypertension: Secondary | ICD-10-CM | POA: Diagnosis not present

## 2018-07-12 DIAGNOSIS — J449 Chronic obstructive pulmonary disease, unspecified: Secondary | ICD-10-CM | POA: Diagnosis not present

## 2018-07-12 NOTE — Patient Outreach (Signed)
[place] [facility]) Care Management  07/12/2018  [name] 06/12/1925 [id number]    Telephone Screen Referral Date :07/07/2018 Referral Source:[employer] High Risk Referral Reason:Screening for possible needs Insurance:[employer]   Outreach attempt # 1 to patient. HIPAA verified by the patient.   Introduced myself and my role.  Informed her about [facility] services.  She declined the screening at this time.  She felt that we have good services to offer but she is currently working with Dr Luan Pulling office to help get her an aid.  She states if thing don't work out with Dr .Luan Pulling she will call us back.  She thanked me for calling.  Advised the patient that I will send her a letter and pamphlet for future reference    Plan: RN Health Coach will close the case at this time.  RN Health Coach will send successful letter.  Lazaro Arms RN, BSN, [place] Direct Dial:  [phone] Fax: [phone]

## 2018-07-19 DIAGNOSIS — J449 Chronic obstructive pulmonary disease, unspecified: Secondary | ICD-10-CM | POA: Diagnosis not present

## 2018-07-20 ENCOUNTER — Other Ambulatory Visit: Payer: Self-pay | Admitting: Pulmonary Disease

## 2018-07-20 ENCOUNTER — Other Ambulatory Visit (HOSPITAL_COMMUNITY): Payer: Self-pay | Admitting: Pulmonary Disease

## 2018-07-20 DIAGNOSIS — R221 Localized swelling, mass and lump, neck: Secondary | ICD-10-CM

## 2018-07-26 ENCOUNTER — Other Ambulatory Visit: Payer: Self-pay | Admitting: *Deleted

## 2018-07-26 ENCOUNTER — Other Ambulatory Visit: Payer: Self-pay

## 2018-07-26 NOTE — Patient Outreach (Signed)
[facility]) Care Management  07/26/2018  [name] Jun 07, 1925 [id number]   Subjective: Telephone call to patient's home number, spoke with patient's daughter (designated party release, [name]), she stated patient's name, date of birth, and address.  Discussed [place] Medicare EMMI Prevent follow up, daughter voiced understanding, and is in agreement to follow up on patient's behalf.   Daughter states she was present during patient's conversation earlier today with [place] Management Assistant Verlon Setting.  Daughter states patient does not verbalize needs, will say that she does not need anything, and is aware that patient may have declined [place] Management services in the past.  Discussed [place] Management services, daughter voices understanding, declines referral to Telephonic RNCM for care coordination, and referral to Health Coach for COPD disease management.   Daughter states patient is doing ok, able to bath, dressing herself, balance checkbook, and do some light house keeping.  States patient is slowing down with being able to self manage home management without assistance, daughter has own health issues, daughter only able to provide limited assistance at this time, is needing assistance with caring for patient's needs, is in agreement to a referral to [place] [place] Management Social Worker for possible assisted living placement ( may need assistance with determine appropriate level of care), Meals on Wheels assistance, transportation community resources, light housekeeping community resources, Product/process development scientist, and assist with document completion.  Daughter states she can longer provider assistance to patient due to her own health conditions, has spoken with local department of Social Services, requested paperwork for Medicaid application, and is waiting for paperwork to arrive.  Daughter states she is graceful to  whatever assistance she can obtain to assist her mother, will follow up on all avenues of assistance with [place] Management, local Social Services department, and community resources.   Discuss general overall of how level of care is determined for placement, advised daughter to follow up with [place] Management Social Worker, and local department of Social Services regarding her questions, daughter voices understanding, states she will follow up as appropriate.  States patient had a fall the night of 07/24/2018 with no apparent injury, moving without pain, non witnessed fall, patient did not report to her until 07/25/2018, daughter reported fall to patient's primary MD, and was given no recommendations for additional follow up at this time. States patient has a straight cane and walker, she prefers to use cane, instead of walker.  Daughter states she is aware of signs/ symptoms to report, how to reach provider if needed after hours, when to go to ED, and / or call 911.  Daughter states if patient remains in home, will need assistance with meal prep or Meals on Wheels, since it is becoming more difficult for patient to prepare her own meals.  States patient will also need assistance with light housekeeping and transportation to appointment.   Daughter states patient has other children but she is the only child that is providing any assistance at this time, sometimes patient medical appointment conflict with her medical appointment, and the appointments have to be rescheduled.  Daughter states patient's primary MD saw patient 2 weeks ago for an office visit, is aware of patient's increasing needs and is in the process of follow up with insurance company to see what assistance is available to patient.   Daughter aware that some personal care and / or light housekeeping services may be private pay, states patient  does not have resources to pay out of pocket, and will assist patient with determining needed services.   Daughter voices understanding of patient's medical diagnosis and treatment plan.  Daughter states patient does not have any education material, transition of care, care coordination, disease management, disease monitoring, or pharmacy needs at this time.  States she is very appreciative of the follow up and is in agreement to receive [facility] Management services on patient's behalf.      Objective: Per KPN (Knowledge Performance Now, point of care tool) and chart review, patient has had no recent hospitalizations or ED visits.   Patient also has a history of COPD, hypertension, breast cancer, ischemic colitis, and sepsis.     Assessment: Received [employer] EMMI Prevent referral on 07/26/2018.  Patient engagement tool completed on 7/14/20202 by Verlon Setting at [facility].   Screening follow up completed and patient referred to [facility] Management Social Worker for possible assisted living placement ( may need assistance with determine appropriate level of care), Meals on Wheels assistance, transportation community resources, light housekeeping community resources, Product/process development scientist, and assist with document completion.      Plan: RNCM will refer patient to [facility] Social Worker for possible assisted living placement ( may need assistance with determine appropriate level of care), Meals on Wheels assistance, transportation community resources, light housekeeping community resources, Product/process development scientist, and assist with document completion.     Alesandra Smart H. Annia Friendly, BSN, [facility] Management [facility] Telephonic CM Phone: [phone] Fax: [phone]

## 2018-07-27 ENCOUNTER — Other Ambulatory Visit: Payer: Self-pay

## 2018-07-27 NOTE — Patient Outreach (Signed)
[place] [facility]) Care Management  07/27/2018  [name] November 07, 1925 [id number]   Social work referral received from [employer], [employer].  "Referral to [place] Management Social Worker for possible assisted living placement ( may need assistance with determine appropriate level of care), Meals on Wheels assistance, transportation community resources, light housekeeping community resources, Product/process development scientist, please send daughter Scientist, physiological packet, and follow up to assist with document completion. Contact patient's daughter [name] for assessment completion." Unsuccessful outreach to daughter today.  Left voicemail message.  Will attempt to reach again within four business days.  Unsuccessful outreach letter mailed.  Ronn Melena, BSW Social Worker [phone]

## 2018-07-29 ENCOUNTER — Other Ambulatory Visit: Payer: Self-pay

## 2018-07-29 NOTE — Patient Outreach (Signed)
[facility]) Care Management  07/29/2018  [name] May 30, 1925 [id number]   Social work referral received from [employer], [employer].  "Referral to [place] Management Social Worker for possible assisted living placement ( may need assistance with determine appropriate level of care), Meals on Wheels assistance, transportation community resources, light housekeeping community resources, Product/process development scientist, please send daughter Scientist, physiological packet, and follow up to assist with document completion. Contact patient's daughter [name] for assessment completion." Successful outreach to patient's daughter today.  ALF/PCS Resources:  Daughter reported that she had a very lengthy conversation with patient this morning about placement and in-home aide options.  Daughter has already done a lot of research on these options and was knowledgeable about what is covered by Medicare and/or Medicaid.  Daughter has already been in communication with a caseworker at Department of Social Services and was informed that patient does qualify for Medicaid for long-term care but not for community Medicaid.  Thus, cost of SNF would be covered but not personal care services.  Daughter reports that patient cannot afford to privately pay for aide services.  Daughter and patient have decided that she will remain in the home at this point with continued assistance from daughter.  Daughter has Medicaid application for future use if patient decides to pursue long-term care.  Meals on Wheels:  Daughter reports that patient occasionally prepares her own meals.  Daughter ensures that she has plenty of food and tries to provide things that can be easily prepared or warmed.  BSW submitted referral to Meals on Wheels program but informed daughter that there is currently a wait list of approximately 150 people.    Transportation:  BSW informed daughter that patient may be eligible for  transportation benefits through [employer] and provided her with contact information for [employer] educated daughter about [facility] and provided her with number to be used to schedule transport.  Advance Directives:  BSW and daughter discussed HC POA and Living Will.  BSW will mail Advance Directive EMMI and packet.  Will follow up within the next two weeks to ensure receipt.   Ronn Melena, BSW Social Worker [phone]

## 2018-08-09 ENCOUNTER — Other Ambulatory Visit (HOSPITAL_COMMUNITY): Payer: Self-pay | Admitting: Pulmonary Disease

## 2018-08-09 ENCOUNTER — Other Ambulatory Visit: Payer: Self-pay

## 2018-08-09 ENCOUNTER — Ambulatory Visit (HOSPITAL_COMMUNITY)
Admission: RE | Admit: 2018-08-09 | Discharge: 2018-08-09 | Disposition: A | Discharge status: Home / Self Care | Payer: Medicare Other | Source: Ambulatory Visit | Attending: Pulmonary Disease | Admitting: Pulmonary Disease

## 2018-08-09 DIAGNOSIS — R221 Localized swelling, mass and lump, neck: Secondary | ICD-10-CM

## 2018-08-09 DIAGNOSIS — K118 Other diseases of salivary glands: Secondary | ICD-10-CM | POA: Diagnosis not present

## 2018-08-10 DIAGNOSIS — J449 Chronic obstructive pulmonary disease, unspecified: Secondary | ICD-10-CM | POA: Diagnosis not present

## 2018-08-10 DIAGNOSIS — I2510 Atherosclerotic heart disease of native coronary artery without angina pectoris: Secondary | ICD-10-CM | POA: Diagnosis not present

## 2018-08-10 DIAGNOSIS — R9389 Abnormal findings on diagnostic imaging of other specified body structures: Secondary | ICD-10-CM | POA: Diagnosis not present

## 2018-08-11 ENCOUNTER — Other Ambulatory Visit: Payer: Self-pay | Admitting: Pulmonary Disease

## 2018-08-11 ENCOUNTER — Other Ambulatory Visit (HOSPITAL_COMMUNITY): Payer: Self-pay | Admitting: Pulmonary Disease

## 2018-08-11 DIAGNOSIS — N390 Urinary tract infection, site not specified: Secondary | ICD-10-CM | POA: Diagnosis not present

## 2018-08-11 DIAGNOSIS — R918 Other nonspecific abnormal finding of lung field: Secondary | ICD-10-CM

## 2018-08-11 DIAGNOSIS — J449 Chronic obstructive pulmonary disease, unspecified: Secondary | ICD-10-CM | POA: Diagnosis not present

## 2018-08-11 DIAGNOSIS — I2510 Atherosclerotic heart disease of native coronary artery without angina pectoris: Secondary | ICD-10-CM | POA: Diagnosis not present

## 2018-08-12 ENCOUNTER — Other Ambulatory Visit: Payer: Self-pay

## 2018-08-12 NOTE — Patient Outreach (Signed)
[facility]) Care Management  08/12/2018  [name] 05-25-1925 [id number]   Successful follow up call to patient's daughter to ensure receipt of Advance Directive EMMI and packet that was mailed on 07/29/18.  Daughter confirmed receipt but stated that she has not had the opportunity to review/complete the documentation.  Per daughter, patient was recently diagnosed with lung cancer. Daughter stated that she knows completing this documentation needs to be a priority.  Per daughter, the treatment plan for this new diagnosis is being developed so "it feels like a lot of things are on hold for now".  Closing [facility] case for now but BSW did encourage patient to call if needs arise or if she needs further assistance with Advance Directives.  BSW informed her that MD can request Care Management involvement again if needed.  Ronn Melena, BSW Social Worker [phone]

## 2018-08-19 DIAGNOSIS — J449 Chronic obstructive pulmonary disease, unspecified: Secondary | ICD-10-CM | POA: Diagnosis not present

## 2018-08-29 ENCOUNTER — Ambulatory Visit (HOSPITAL_COMMUNITY)
Admission: RE | Admit: 2018-08-29 | Discharge: 2018-08-29 | Disposition: A | Discharge status: Home / Self Care | Payer: Medicare Other | Source: Ambulatory Visit | Attending: Pulmonary Disease | Admitting: Pulmonary Disease

## 2018-08-29 ENCOUNTER — Other Ambulatory Visit: Payer: Self-pay

## 2018-08-29 DIAGNOSIS — R918 Other nonspecific abnormal finding of lung field: Secondary | ICD-10-CM

## 2018-08-29 DIAGNOSIS — I712 Thoracic aortic aneurysm, without rupture: Secondary | ICD-10-CM | POA: Diagnosis not present

## 2018-08-29 LAB — POCT I-STAT CREATININE: Creatinine, Ser: 0.7 mg/dL (ref 0.44–1.00)

## 2018-08-29 MED ORDER — IOHEXOL 300 MG/ML  SOLN
75.0000 mL | Freq: Once | INTRAMUSCULAR | Status: AC | PRN
  Administered 2018-08-29: 75 mL via INTRAVENOUS

## 2018-08-30 DIAGNOSIS — C3490 Malignant neoplasm of unspecified part of unspecified bronchus or lung: Secondary | ICD-10-CM | POA: Diagnosis not present

## 2018-08-30 DIAGNOSIS — J449 Chronic obstructive pulmonary disease, unspecified: Secondary | ICD-10-CM | POA: Diagnosis not present

## 2018-08-30 DIAGNOSIS — I2510 Atherosclerotic heart disease of native coronary artery without angina pectoris: Secondary | ICD-10-CM | POA: Diagnosis not present

## 2018-08-30 DIAGNOSIS — J9611 Chronic respiratory failure with hypoxia: Secondary | ICD-10-CM | POA: Diagnosis not present

## 2018-10-03 DIAGNOSIS — J449 Chronic obstructive pulmonary disease, unspecified: Secondary | ICD-10-CM | POA: Diagnosis not present

## 2018-10-03 DIAGNOSIS — J9611 Chronic respiratory failure with hypoxia: Secondary | ICD-10-CM | POA: Diagnosis not present

## 2018-10-03 DIAGNOSIS — I25110 Atherosclerotic heart disease of native coronary artery with unstable angina pectoris: Secondary | ICD-10-CM | POA: Diagnosis not present

## 2018-11-21 DIAGNOSIS — R918 Other nonspecific abnormal finding of lung field: Secondary | ICD-10-CM | POA: Diagnosis not present

## 2018-11-21 DIAGNOSIS — J9611 Chronic respiratory failure with hypoxia: Secondary | ICD-10-CM | POA: Diagnosis not present

## 2018-11-21 DIAGNOSIS — J449 Chronic obstructive pulmonary disease, unspecified: Secondary | ICD-10-CM | POA: Diagnosis not present

## 2019-01-09 ENCOUNTER — Emergency Department (HOSPITAL_COMMUNITY)
Admission: EM | Admit: 2019-01-09 | Discharge: 2019-01-09 | Disposition: A | Discharge status: Hospice | Payer: Medicare Other | Attending: Emergency Medicine | Admitting: Emergency Medicine

## 2019-01-09 ENCOUNTER — Emergency Department (HOSPITAL_COMMUNITY): Payer: Medicare Other

## 2019-01-09 ENCOUNTER — Other Ambulatory Visit: Payer: Self-pay

## 2019-01-09 DIAGNOSIS — C3490 Malignant neoplasm of unspecified part of unspecified bronchus or lung: Secondary | ICD-10-CM | POA: Insufficient documentation

## 2019-01-09 DIAGNOSIS — Y939 Activity, unspecified: Secondary | ICD-10-CM | POA: Diagnosis not present

## 2019-01-09 DIAGNOSIS — J449 Chronic obstructive pulmonary disease, unspecified: Secondary | ICD-10-CM | POA: Diagnosis not present

## 2019-01-09 DIAGNOSIS — R404 Transient alteration of awareness: Secondary | ICD-10-CM | POA: Diagnosis not present

## 2019-01-09 DIAGNOSIS — S022XXA Fracture of nasal bones, initial encounter for closed fracture: Secondary | ICD-10-CM | POA: Diagnosis not present

## 2019-01-09 DIAGNOSIS — Y92009 Unspecified place in unspecified non-institutional (private) residence as the place of occurrence of the external cause: Secondary | ICD-10-CM | POA: Insufficient documentation

## 2019-01-09 DIAGNOSIS — I10 Essential (primary) hypertension: Secondary | ICD-10-CM | POA: Insufficient documentation

## 2019-01-09 DIAGNOSIS — S199XXA Unspecified injury of neck, initial encounter: Secondary | ICD-10-CM | POA: Diagnosis not present

## 2019-01-09 DIAGNOSIS — Z20828 Contact with and (suspected) exposure to other viral communicable diseases: Secondary | ICD-10-CM | POA: Insufficient documentation

## 2019-01-09 DIAGNOSIS — Z87891 Personal history of nicotine dependence: Secondary | ICD-10-CM | POA: Insufficient documentation

## 2019-01-09 DIAGNOSIS — I959 Hypotension, unspecified: Secondary | ICD-10-CM | POA: Diagnosis not present

## 2019-01-09 DIAGNOSIS — Y999 Unspecified external cause status: Secondary | ICD-10-CM | POA: Diagnosis not present

## 2019-01-09 DIAGNOSIS — R0902 Hypoxemia: Secondary | ICD-10-CM | POA: Diagnosis not present

## 2019-01-09 DIAGNOSIS — Z515 Encounter for palliative care: Secondary | ICD-10-CM

## 2019-01-09 DIAGNOSIS — R58 Hemorrhage, not elsewhere classified: Secondary | ICD-10-CM | POA: Diagnosis not present

## 2019-01-09 DIAGNOSIS — Z79899 Other long term (current) drug therapy: Secondary | ICD-10-CM | POA: Diagnosis not present

## 2019-01-09 DIAGNOSIS — Z7401 Bed confinement status: Secondary | ICD-10-CM | POA: Diagnosis not present

## 2019-01-09 DIAGNOSIS — S0990XA Unspecified injury of head, initial encounter: Secondary | ICD-10-CM | POA: Diagnosis present

## 2019-01-09 DIAGNOSIS — I2510 Atherosclerotic heart disease of native coronary artery without angina pectoris: Secondary | ICD-10-CM | POA: Diagnosis not present

## 2019-01-09 DIAGNOSIS — Z03818 Encounter for observation for suspected exposure to other biological agents ruled out: Secondary | ICD-10-CM | POA: Diagnosis not present

## 2019-01-09 DIAGNOSIS — W19XXXA Unspecified fall, initial encounter: Secondary | ICD-10-CM | POA: Insufficient documentation

## 2019-01-09 DIAGNOSIS — E1165 Type 2 diabetes mellitus with hyperglycemia: Secondary | ICD-10-CM | POA: Diagnosis not present

## 2019-01-09 DIAGNOSIS — Z743 Need for continuous supervision: Secondary | ICD-10-CM | POA: Diagnosis not present

## 2019-01-09 LAB — POC SARS CORONAVIRUS 2 AG -  ED: SARS Coronavirus 2 Ag: NEGATIVE

## 2019-01-09 LAB — RESPIRATORY PANEL BY RT PCR (FLU A&B, COVID)
Influenza A by PCR: NEGATIVE
Influenza B by PCR: NEGATIVE
SARS Coronavirus 2 by RT PCR: NEGATIVE

## 2019-01-09 LAB — CBG MONITORING, ED: Glucose-Capillary: 148 mg/dL — ABNORMAL HIGH (ref 70–99)

## 2019-01-09 NOTE — ED Notes (Addendum)
EDP at bedside, pt 02 saturation 90-92 on non rebreather, and stated would order scans, rapid covid, and palliative consult. EDP reported to hold IV insertion at this time.

## 2019-01-09 NOTE — Discharge Instructions (Signed)
Return to the ER for any severe or worsening symptoms.

## 2019-01-09 NOTE — ED Notes (Addendum)
Unable to obtain pulse ox reading. Notified primary RN and Camera operator. Upon assessing patient, lips noted to by cyanotic, spontaneous eye movement,not following commands. EDP notified and EDP RN assisted ventilation via ambu bag.  Pt o2 saturation noted to increase and maintain at 90% with ambu bag. Client placed on non-rebreather.  Charge RN called hospice to inquired about baseline. Pt baseline ambulatory and able to make own meals at home. DNR.  Warm blankets applied. Warm fluids at bedside. Attempted IV x2.

## 2019-01-09 NOTE — ED Provider Notes (Signed)
[facility] EMERGENCY DEPARTMENT Provider Note   CSN: [id number] Arrival date & time: 01/09/19  1202     History Chief Complaint  Patient presents with  . Fall    [name] is a 85 y.o. female on home hospice presenting from home with concern for fall and head injury.  Patient is reportedly ambulatory at baseline.  She was found down today.  EMS unable to obtain a reliable pulse ox, on arrival we also difficulties obtaining a reliable pulse ox.  Patient received supplemental oxygen en route  Level 5 caveat - dementia/altered  HPI     Past Medical History:  Diagnosis Date  . Aneurysm ([place])    thoracic aorta  . Anxiety   . Breast cancer ([place]) 2003   treated with surgery and Arimadex  . COPD (chronic obstructive pulmonary disease) ([place])   . Coronary artery disease    2 stents per patient  . Depression   . Dilated bile duct    noted on CT 2009, ampulla looked normal on EGD  . Hypercholesteremia   . Hypertension   . Ischemic colitis ([place]) 2009  . PONV (postoperative nausea and vomiting)     Patient Active Problem List   Diagnosis Date Noted  . Sepsis ([place]) 11/15/2015  . Bacteremia due to Klebsiella pneumoniae 11/15/2015  . Elevated lipase   . Jaundice of recent onset   . Nausea and vomiting   . Gall stones, common bile duct 11/14/2015  . Cholangitis due to bile duct calculus with obstruction 11/14/2015  . Calculus of bile duct with cholangitis and obstruction   . Elevated LFTs   . Hypertension 11/11/2015  . Hyperbilirubinemia 11/11/2015  . Hyponatremia 11/11/2015  . Pancreatitis 11/11/2015  . Abdominal pain 11/11/2015  . GERD (gastroesophageal reflux disease) 11/11/2015  . Generalized weakness 01/13/2015  . Chronic respiratory failure with hypoxia ([place]) 01/13/2015  . D-dimer, elevated 01/13/2015  . Abnormal LFTs 09/29/2013  . Clostridium difficile colitis 11/06/2011  . COPD (chronic obstructive pulmonary disease) ([place]) 11/06/2011  . Coronary  atherosclerosis 11/06/2011  . Anxiety 11/06/2011  . Depression 11/06/2011    Past Surgical History:  Procedure Laterality Date  . ABDOMINAL HYSTERECTOMY    . APPENDECTOMY    . BREAST SURGERY     left partial mastectomy 04/15/11, wide excision left breast  04/21/11, left partial mastectomy 12/19/02  . CHOLECYSTECTOMY    . COLONOSCOPY  05/2007   Dr. Perfecto Kingdom, ulcerated, edematous area at 35-40cm from anal verge bx c/w ischemic colitis, frequent sigm tics, small internal hemrrhoids. SMA/IMA wide open on CT at that time.   . CORONARY ANGIOPLASTY WITH STENT PLACEMENT     in remote past, ?2001   . ERCP N/A 11/14/2015   Procedure: ENDOSCOPIC RETROGRADE CHOLANGIOPANCREATOGRAPHY (ERCP);  Surgeon: Daneil Dolin, MD;  Location: [facility] ORS;  Service: Gastroenterology;  Laterality: N/A;  . ESOPHAGOGASTRODUODENOSCOPY  05/2007   Dr. Malon Kindle ring, 2-3 cm HH, streaky erythema antrum with superficial erosions, bx gastritis without H.Pylori  . HERNIA REPAIR    . SPHINCTEROTOMY N/A 11/14/2015   Procedure: SPHINCTEROTOMY;  Surgeon: Daneil Dolin, MD;  Location: [facility];  Service: Gastroenterology;  Laterality: N/A;  . SPYGLASS CHOLANGIOSCOPY N/A 11/14/2015   Procedure: SJGGEZMO CHOLANGIOSCOPY;  Surgeon: Daneil Dolin, MD;  Location: [facility];  Service: Gastroenterology;  Laterality: N/A;  Bess Kinds LITHOTRIPSY N/A 11/14/2015   Procedure: QHUTMLYY LITHOTRIPSY;  Surgeon: Daneil Dolin, MD;  Location: [facility];  Service: Gastroenterology;  Laterality: N/A;  .  STONE EXTRACTION WITH BASKET N/A 11/14/2015   Procedure: STONE EXTRACTION WITH BASKET AND BALLOON;  Surgeon: Daneil Dolin, MD;  Location: [facility];  Service: Gastroenterology;  Laterality: N/A;     OB History   No obstetric history on file.     Family History  Problem Relation Age of Onset  . Breast cancer Daughter   . Breast cancer Mother   . Breast cancer Sister   . Colon cancer Neg Hx     Social History   Tobacco Use  .  Smoking status: Former Smoker    Quit date: 01/05/2015    Years since quitting: 4.0  . Smokeless tobacco: Never Used  Substance Use Topics  . Alcohol use: No  . Drug use: No    Home Medications Prior to Admission medications   Medication Sig Start Date End Date Taking? Authorizing Provider  albuterol (PROVENTIL HFA;VENTOLIN HFA) 108 (90 BASE) MCG/ACT inhaler Inhale 2 puffs into the lungs 2 (two) times daily as needed. Shortness of Breath    [provider]  atenolol (TENORMIN) 25 MG tablet Take 25 mg by mouth every morning.     [provider]  escitalopram (LEXAPRO) 5 MG tablet Take 5 mg by mouth daily.      [provider]  LORazepam (ATIVAN) 0.5 MG tablet Take 0.5 mg by mouth at bedtime.     [provider]  meclizine (ANTIVERT) 25 MG tablet Take 25 mg by mouth 3 (three) times daily as needed for dizziness.    [provider]  nitroGLYCERIN (NITROSTAT) 0.4 MG SL tablet Place 0.4 mg under the tongue every 5 (five) minutes as needed for chest pain.    [provider]  ondansetron (ZOFRAN ODT) 4 MG disintegrating tablet Take 1 tablet (4 mg total) by mouth every 8 (eight) hours as needed for nausea or vomiting. 10/28/17   Long, Wonda Olds, MD  pantoprazole (PROTONIX) 40 MG tablet Take 40 mg by mouth daily.     [provider]  rosuvastatin (CRESTOR) 10 MG tablet Take 10 mg by mouth daily.    [provider]  tiotropium (SPIRIVA) 18 MCG inhalation capsule Place 18 mcg into inhaler and inhale daily.    [provider]    Allergies    Ceftin [cefuroxime axetil] and Iohexol  Review of Systems   Review of Systems  Unable to perform ROS: Mental status change    Physical Exam Updated Vital Signs BP 107/66   Pulse 84   Temp (!) 93.7 F (34.3 C) (Rectal)   Resp 16   Ht 5\' 6"  (1.676 m)   Wt 59 kg   SpO2 100%   BMI 20.98 kg/m   Physical Exam Vitals and nursing note reviewed.  Constitutional:       General: She is not in acute distress.    Appearance: She is well-developed.     Comments: Thin, frail  HENT:     Head: Normocephalic and atraumatic.     Comments: Dried blood on nares Tenderness and mild deformity of the nose Eyes:     Conjunctiva/sclera: Conjunctivae normal.  Cardiovascular:     Rate and Rhythm: Normal rate and regular rhythm.     Pulses: Normal pulses.  Pulmonary:     Effort: Pulmonary effort is normal.     Comments: 95% on nonrebreather Abdominal:     Palpations: Abdomen is soft.     Tenderness: There is no abdominal tenderness.  Musculoskeletal:     Cervical  back: Neck supple.  Skin:    General: Skin is warm and dry.  Neurological:     Mental Status: She is alert.     Comments: Hard of hearing AAO x 1 to self     ED Results / Procedures / Treatments   Labs (all labs ordered are listed, but only abnormal results are displayed) Labs Reviewed  CBG MONITORING, ED - Abnormal; Notable for the following components:      Result Value   Glucose-Capillary 148 (*)    All other components within normal limits  RESPIRATORY PANEL BY RT PCR (FLU A&B, COVID)  POC SARS CORONAVIRUS 2 AG -  ED    EKG None  Radiology CT Head Wo Contrast  Result Date: 01/09/2019 CLINICAL DATA:  Status post fall today.  Facial injuries. EXAM: CT HEAD WITHOUT CONTRAST CT CERVICAL SPINE WITHOUT CONTRAST TECHNIQUE: Multidetector CT imaging of the head and cervical spine was performed following the standard protocol without intravenous contrast. Multiplanar CT image reconstructions of the cervical spine were also generated. COMPARISON:  Brain MRI 09/15/2013.  CT of the neck 08/09/2018. FINDINGS: CT HEAD FINDINGS Brain: No evidence of acute infarction, hemorrhage, hydrocephalus, extra-axial collection or mass lesion/mass effect. Vascular: No hyperdense vessel or unexpected calcification. Skull: Intact.  No focal lesion. Sinuses/Orbits: Acute left nasal bone fracture with minimal medial  displacement is noted. Scattered mild ethmoid air cell disease and mild mucosal thickening in the right maxillary and sphenoid sinuses noted. Other: None. CT CERVICAL SPINE FINDINGS Alignment: Maintained. Skull base and vertebrae: No acute fracture. No primary bone lesion or focal pathologic process. Soft tissues and spinal canal: There is a mass lesion in the right parotid gland measuring 2.5 x 2.9 cm on image 31 of series 4. The lesion measured 1.9 x 1.9 cm on the prior neck CT. Lymphadenopathy in the right neck is new since the prior neck CT. A 1.5 cm node on image 39 demonstrates mild central low attenuation worrisome for necrosis. More inferiorly, a 1.2 cm node is seen on image 45. Disc levels: Loss of disc space height with endplate sclerosis and spurring are worst at C5-6. Upper chest: There is near complete opacification of the left upper chest by the patient's mass and pleural fluid. The appearance is worse than on the prior neck CT. Dependent atelectasis on the right is noted. Other: None. IMPRESSION: Acute, minimally displaced left nasal bone fracture. No acute intracranial abnormality or acute abnormality of the cervical spine. Mass in the right parotid gland is worrisome for neoplasm and has increased in size since the prior neck CT. 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 Worsened appearance of a left pleural effusion and left apical mass which are incompletely visualized on this study. Electronically Signed   By: Inge Rise M.D.   On: 01/09/2019 14:04   CT Cervical Spine Wo Contrast  Result Date: 01/09/2019 CLINICAL DATA:  Status post fall today.  Facial injuries. EXAM: CT HEAD WITHOUT CONTRAST CT CERVICAL SPINE WITHOUT CONTRAST TECHNIQUE: Multidetector CT imaging of the head and cervical spine was performed following the standard protocol without intravenous contrast. Multiplanar CT  image reconstructions of the cervical spine were also generated. COMPARISON:  Brain MRI 09/15/2013.  CT of the neck 08/09/2018. FINDINGS: CT HEAD FINDINGS Brain: No evidence of acute infarction, hemorrhage, hydrocephalus, extra-axial collection or mass lesion/mass effect. Vascular: No hyperdense vessel or unexpected calcification. Skull: Intact.  No focal lesion. Sinuses/Orbits: Acute left nasal bone fracture with minimal medial displacement is noted. Scattered mild ethmoid air cell disease and mild mucosal thickening in the right maxillary and sphenoid sinuses noted. Other: None. CT CERVICAL SPINE FINDINGS Alignment: Maintained. Skull base and vertebrae: No acute fracture. No primary bone lesion or focal pathologic process. Soft tissues and spinal canal: There is a mass lesion in the right parotid gland measuring 2.5 x 2.9 cm on image 31 of series 4. The lesion measured 1.9 x 1.9 cm on the prior neck CT. Lymphadenopathy in the right neck is new since the prior neck CT. A 1.5 cm node on image 39 demonstrates mild central low attenuation worrisome for necrosis. More inferiorly, a 1.2 cm node is seen on image 45. Disc levels: Loss of disc space height with endplate sclerosis and spurring are worst at C5-6. Upper chest: There is near complete opacification of the left upper chest by the patient's mass and pleural fluid. The appearance is worse than on the prior neck CT. Dependent atelectasis on the right is noted. Other: None. IMPRESSION: Acute, minimally displaced left nasal bone fracture. No acute intracranial abnormality or acute abnormality of the cervical spine. Mass in the right parotid gland is worrisome for neoplasm and has increased in size since the prior neck CT. 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 Worsened appearance of a left pleural effusion and left apical mass which are incompletely visualized on  this study. Electronically Signed   By: Inge Rise M.D.   On: 01/09/2019 14:04    Procedures Procedures (including critical care time)  Medications Ordered in ED Medications - No data to display  ED Course  I have reviewed the triage vital signs and the nursing notes.  Pertinent labs & imaging results that were available during my care of the patient were reviewed by me and considered in my medical decision making (see chart for details).  85 yo female on home hospice for lung cancer, also on home O2, presenting to the ED with possible fall vs syncope and AMS.  Patient found on floor today by home health aid.  Had not been wearing her baseline O2.  Noted to have blood on face on arrival and somnalent and confused, but her mental status and O2 saturation has improved with face mask O2.   After discussion with her family member (daughter), I explained it's extremely unlikely the patient would be able to return home to her semi-independent living.  It sounds like she has been rapidly declining for the past few weeks and culminated with this event today.  She appears quite weak and confused.  I do believe she is nearing the end of her life.  We'll plan for a CT scan of the brain and cervical spine for the purposes of helping to plan for an appropriate disposition, but she likely will need hospice admission.    Clinical Course as of Jan 08 1805  Mon Jan 09, 2019  1334 Spoke to the patient's daughter by phone.  She confirms the patient is DNR/DNI and has been on home hospice.  She tells me the patient was fairly independent and ambulatory until his event today.  However she does report the patient has been in decline for the past 2 to 3 weeks and has had frequent falls at home.  She says she did reach out to the patient's hospice  nurse prior to calling 911, and the hospice nurse had advised that she come to the ED for an evaluation, and that the ER team contact her (hospice) directly after  workup complete.  Patient may be candidate for inpatient hospice   [MT]  1537 Spoke to [place] at [facility] who reports they have an available bed for inpatient hospice, but need a negative covid PCR prior to transfer.  Megan her nurse is sending this.  I also updated the patient's daughter who agrees with transfer to a hospice facility   [MT]    Clinical Course User Index [MT] Myrla Malanowski, Carola Rhine, MD    Final Clinical Impression(s) / ED Diagnoses Final diagnoses:  Fall, initial encounter  Malignant neoplasm of lung, unspecified laterality, unspecified part of lung ([place])  Closed fracture of nasal bone, initial encounter  Hospice care    Rx / DC Orders ED Discharge Orders    None       Wyvonnia Dusky, MD 01/09/19 1807

## 2019-01-09 NOTE — ED Notes (Signed)
Patient transported to CT 

## 2019-01-09 NOTE — ED Triage Notes (Signed)
Per ems, pt is a hospice pt and has stage IV lung cancer.  Reports pt had an unwitnessed fall and has facial injuries and bleeding from nose.  Reports unable to obtain an o2 sat.  Pt on 2.5liters o2 at home but ems put pt on nrb due to not being able to get an o2 sat.   BP 122/70, hr 90, cbg 322.  EMS reports pt usually doesn't speak above a whisper and is hard to understand.

## 2019-01-13 DEATH — deceased

## 2019-07-22 IMAGING — US US ABDOMEN LIMITED
1 series · 14 of 25 positions shown · non-contrast
Comparison: Ultrasound November 12, 2015.

CLINICAL DATA: Right upper quadrant abdominal pain.

EXAM:
ULTRASOUND ABDOMEN LIMITED RIGHT UPPER QUADRANT

[Series 1: us abdomen limited · 0.18mm/px · 14 of 30 slices shown]
[im 1/30]
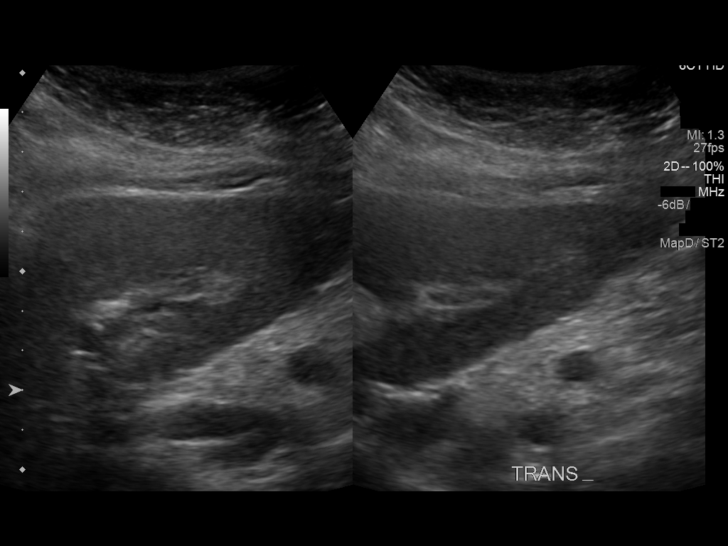
[im 3/30]
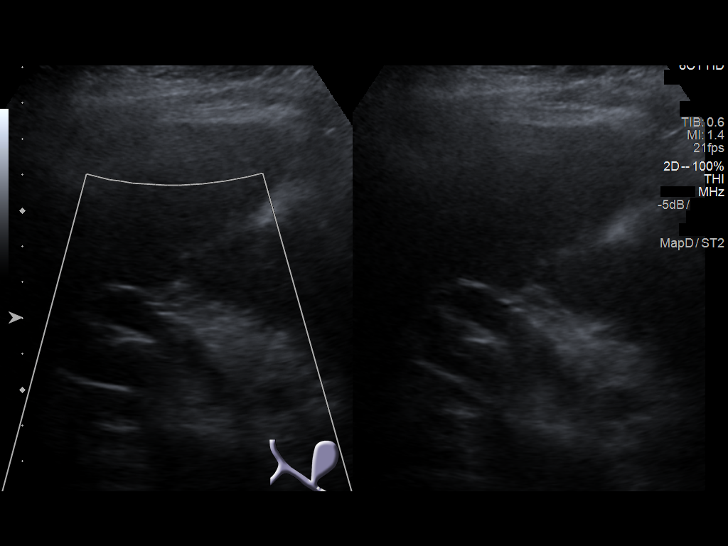
[im 5/30]
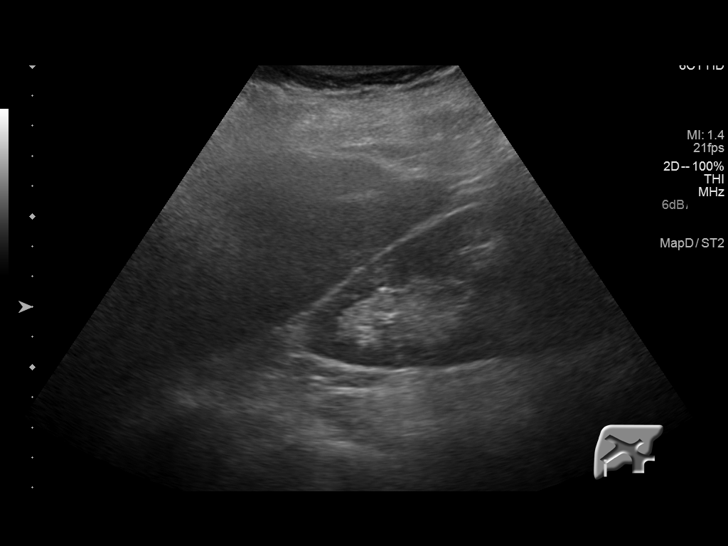
[im 8/30]
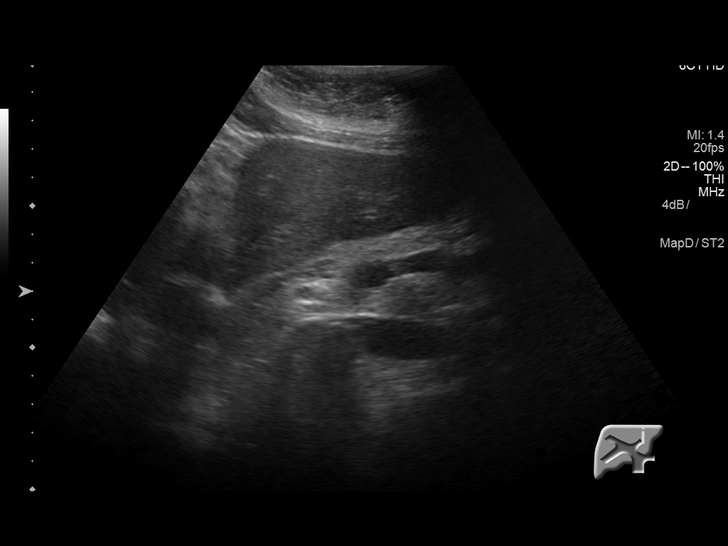
[im 10/30]
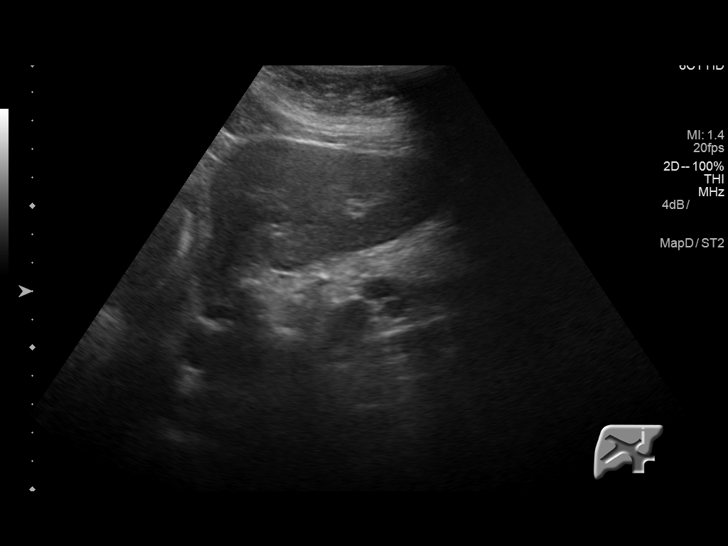
[im 11/30]
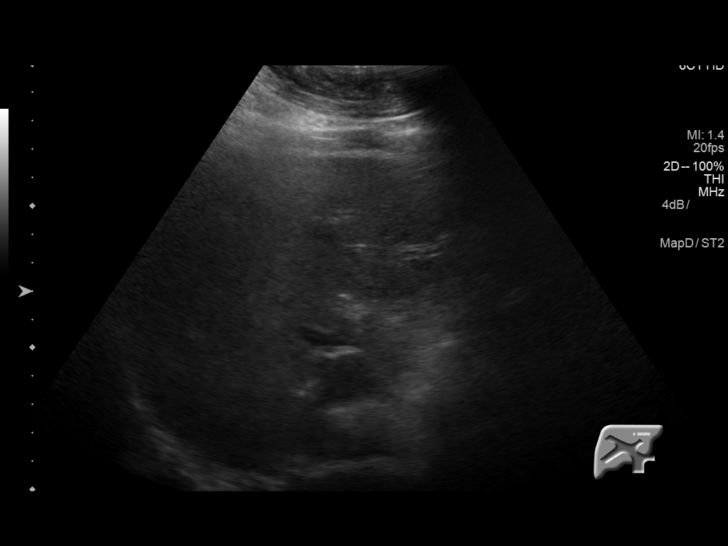
[im 14/30]
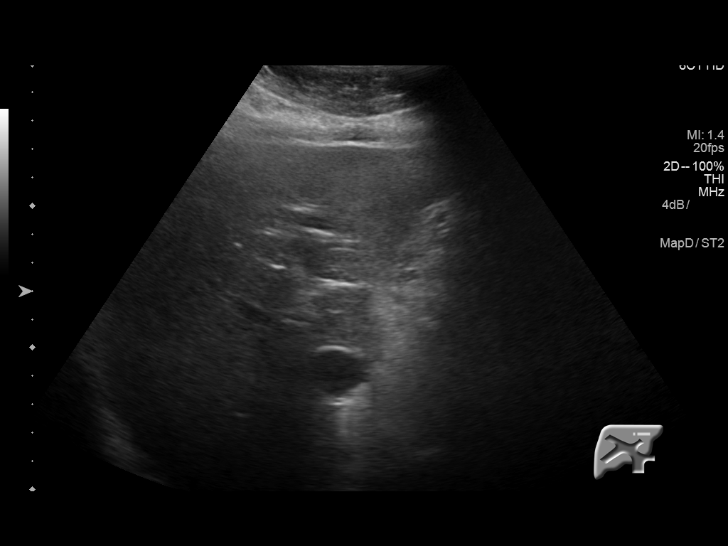
[im 16/30]
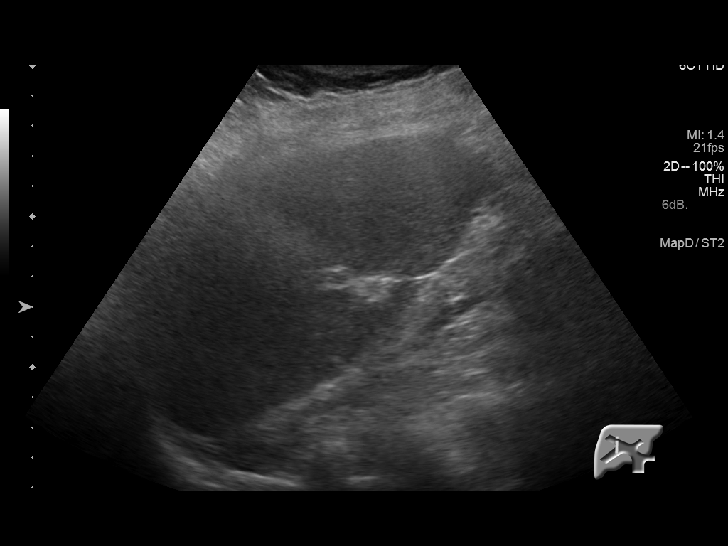
[im 19/30]
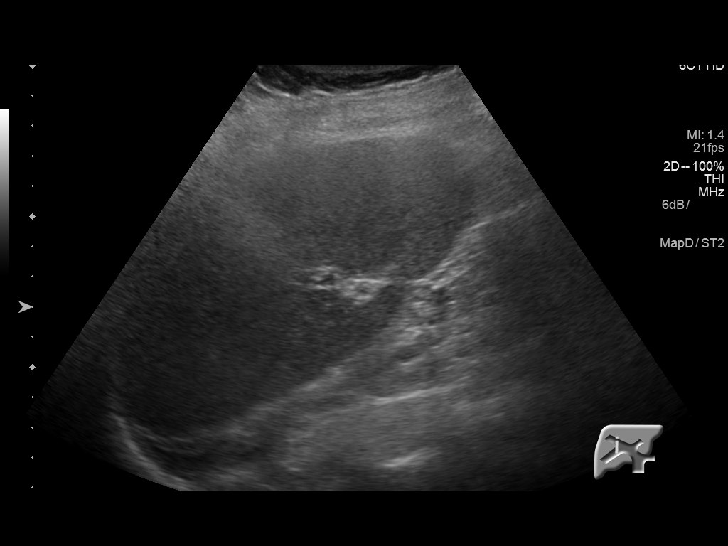
[im 20/30]
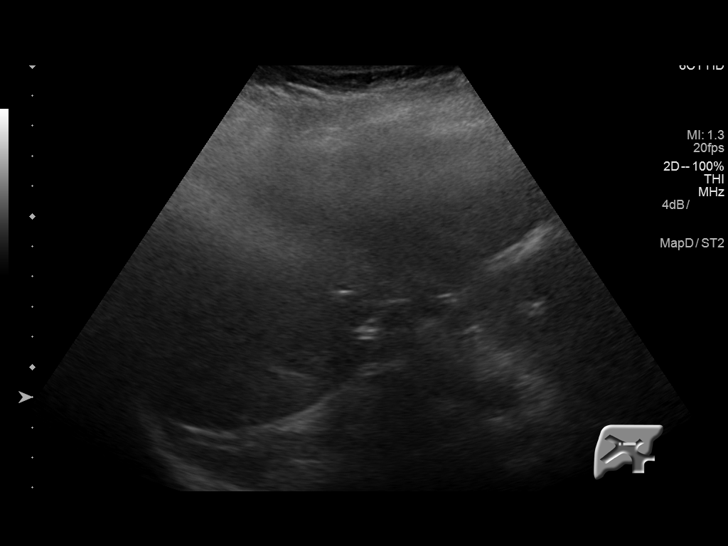
[im 22/30]
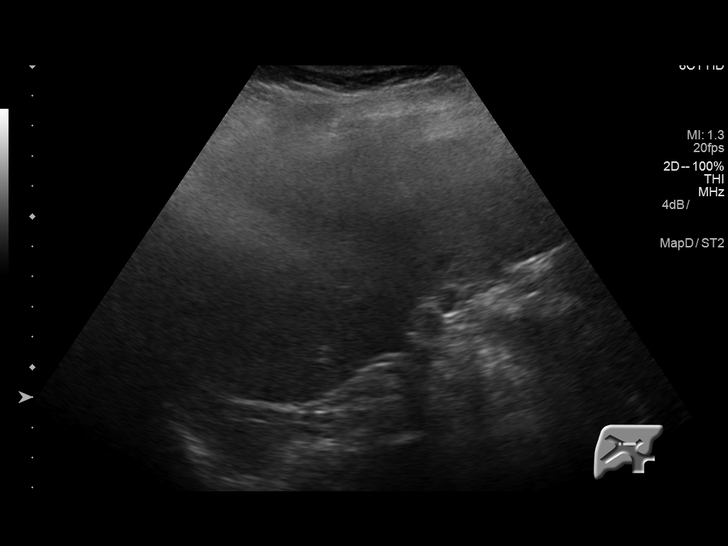
[im 25/30]
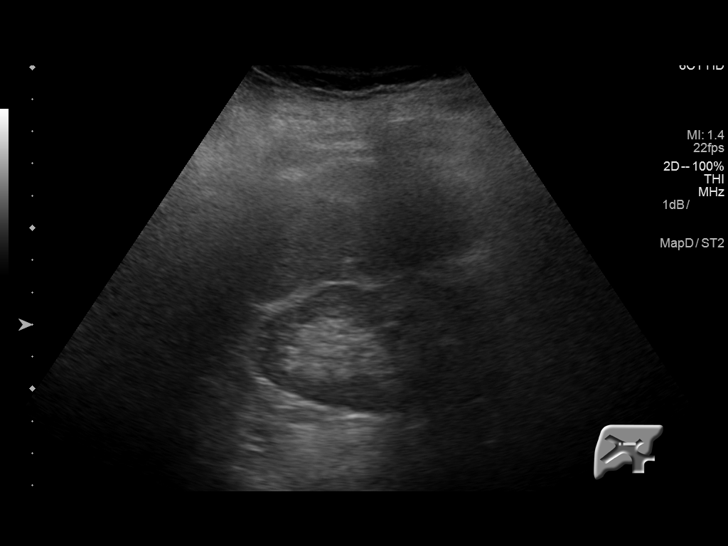
[im 27/30]
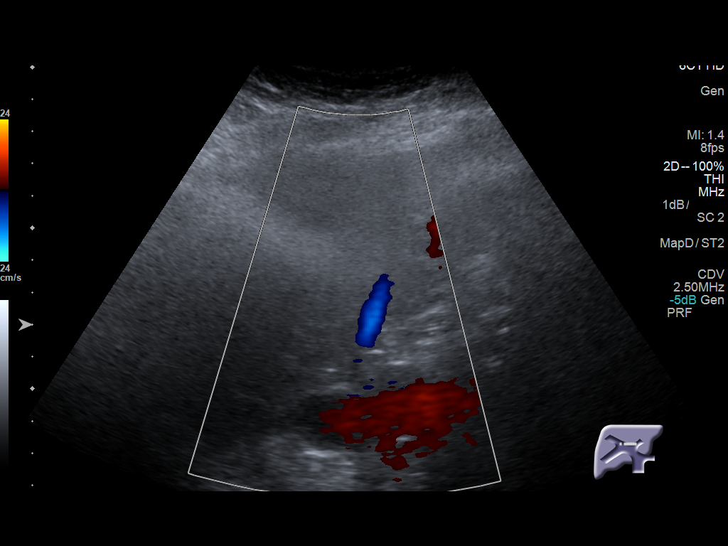
[im 30/30]
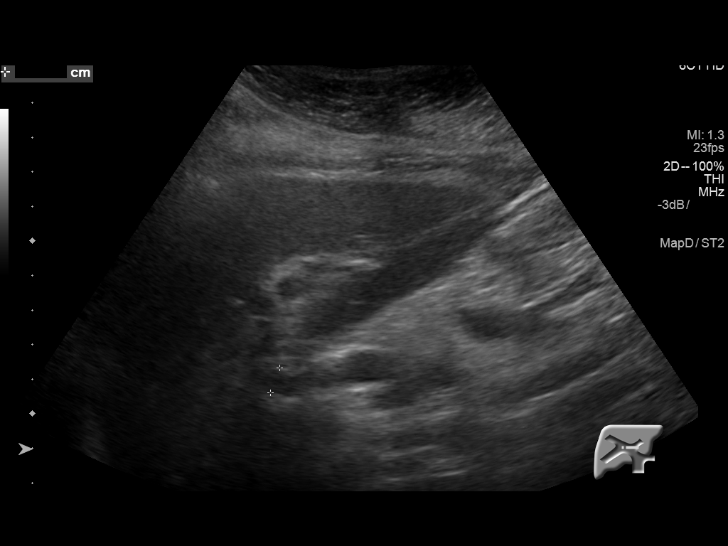

[14 of 25 positions shown; findings below may reference images not displayed]

FINDINGS: Gallbladder:

Status post cholecystectomy.

Common bile duct:

Diameter: 11 mm which may be due to post cholecystectomy status.

Liver:

No focal lesion identified. Within normal limits in parenchymal
echogenicity. Portal vein is patent on color Doppler imaging with
normal direction of blood flow towards the liver.
IMPRESSION: Status post cholecystectomy. Stable common bile duct dilatation is
noted most likely due to post cholecystectomy status. No acute
abnormality seen in the right upper quadrant the abdomen.

## 2020-11-07 IMAGING — CT CT NECK WITHOUT CONTRAST
3 of 4 series · 12 of 33 positions shown, 14 images · non-contrast
Comparison: CT abdomen/pelvis 10/29/2017 chest CT 11/12/2015

CLINICAL DATA: Lump on neck. Additional history: Patient reports
tiny knot under right ear lobe that was noticed approximately 1
month ago, sometimes painful, two previous antibiotic treatments.

EXAM:
CT NECK WITHOUT CONTRAST
TECHNIQUE: Multidetector CT imaging of the neck was performed following the
standard protocol without intravenous contrast.

[Series 4: cor neck · coronal · 0.44mm/px · 3 of 131 slices shown]
[im 50/131  bone]
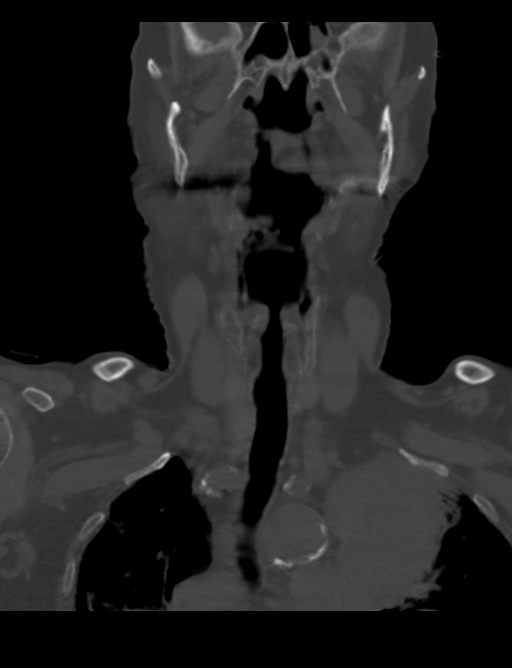
[im 60/131  bone]
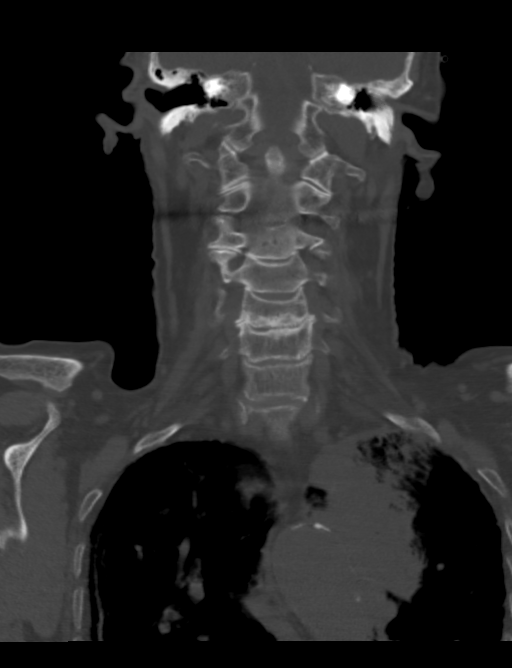
[im 71/131  bone]
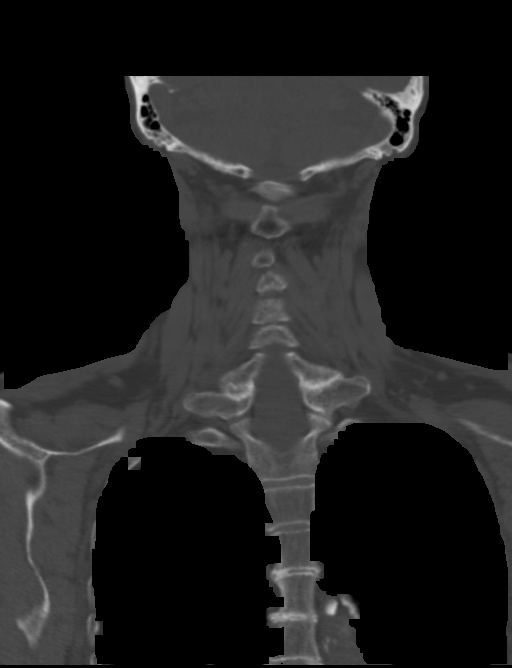

[Series 5: sag neck · sagittal · 0.46mm/px · 5 of 101 slices shown, 6 images]
[im 34/101  bone]
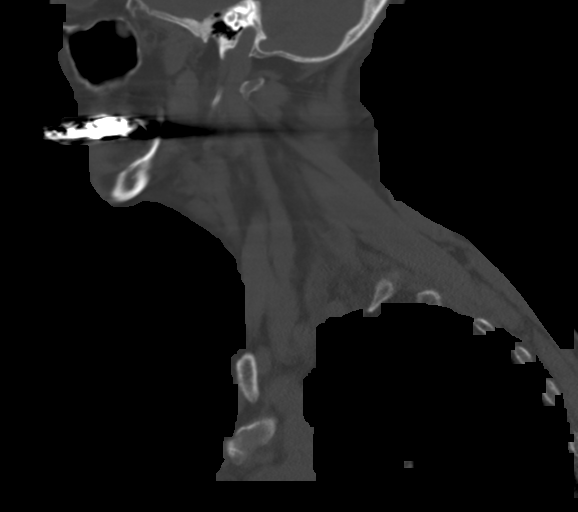
[im 42/101  bone]
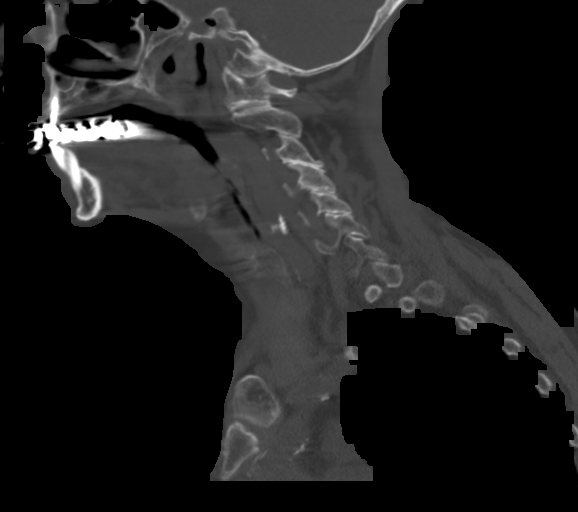
[im 51/101  soft-tissue]
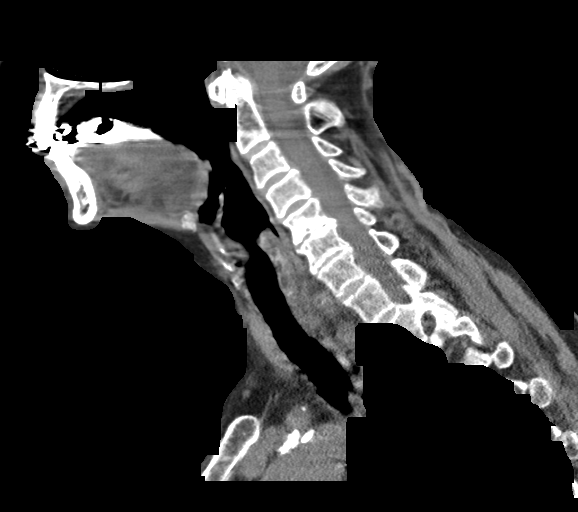
[im 51/101  bone]
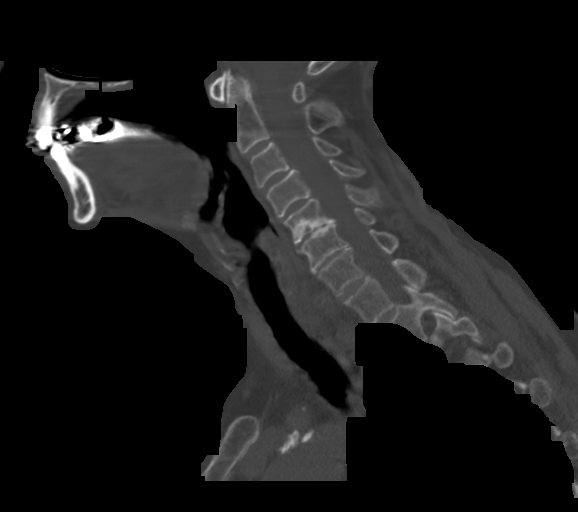
[im 59/101  bone]
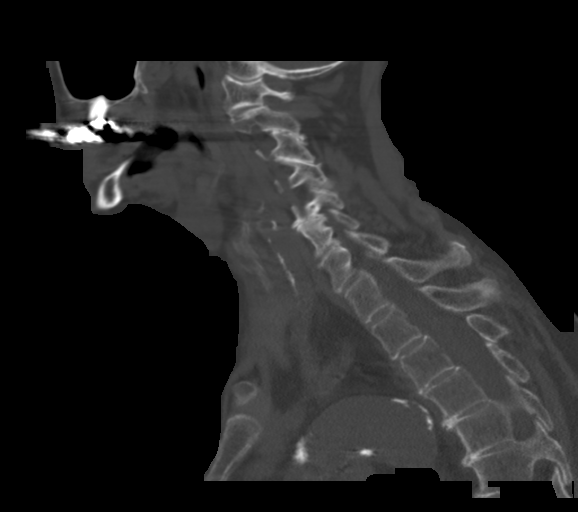
[im 67/101  bone]
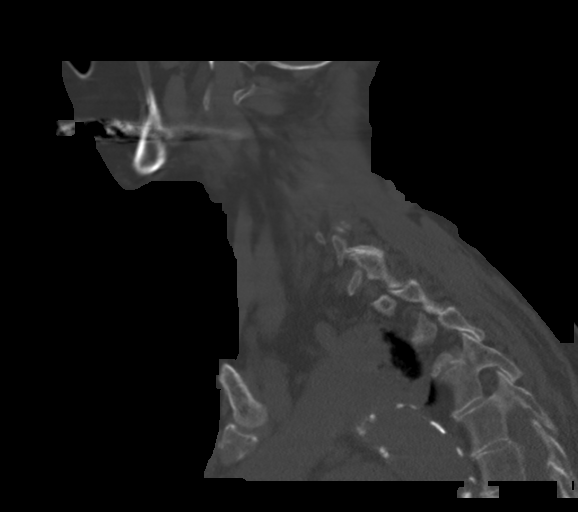

[Series 6: ax oropharynx · axial · 0.40mm/px · z∈[+1317,+1496]mm · 4 of 139 slices shown, 5 images]
[im 20/139  soft-tissue]
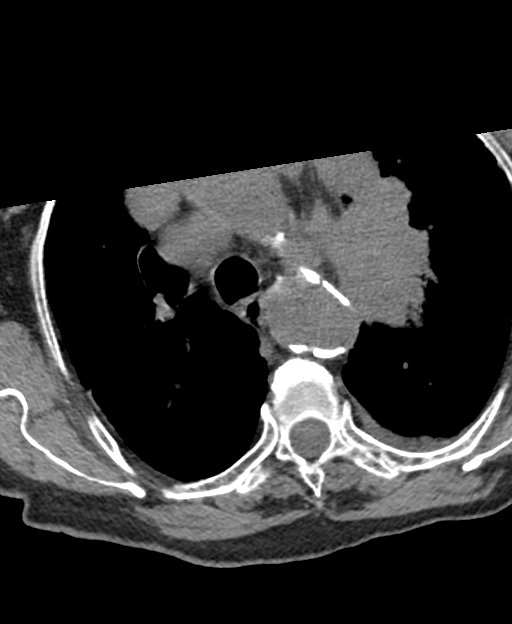
[im 20/139  bone]
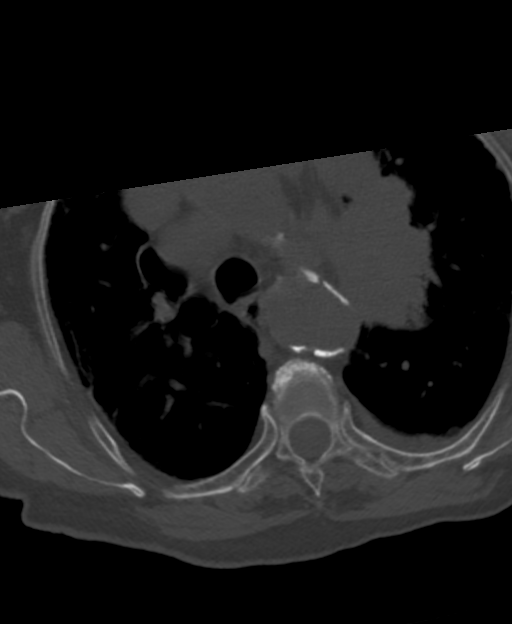
[im 60/139  bone]
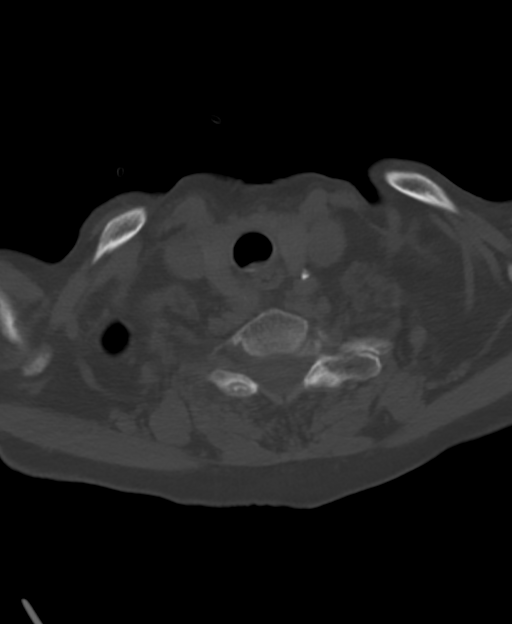
[im 79/139  bone]
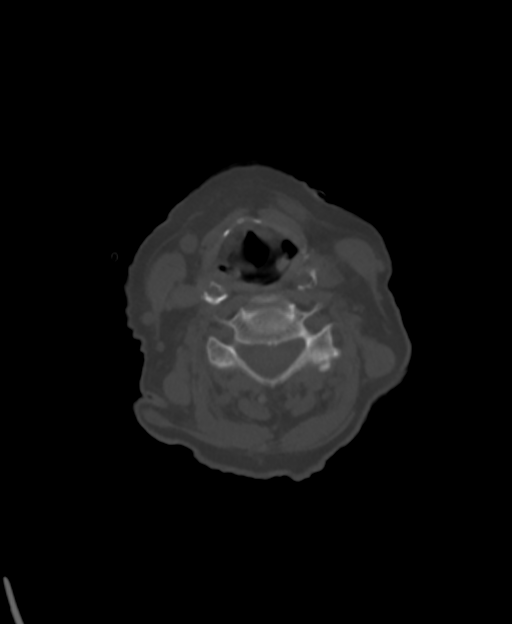
[im 119/139  bone]
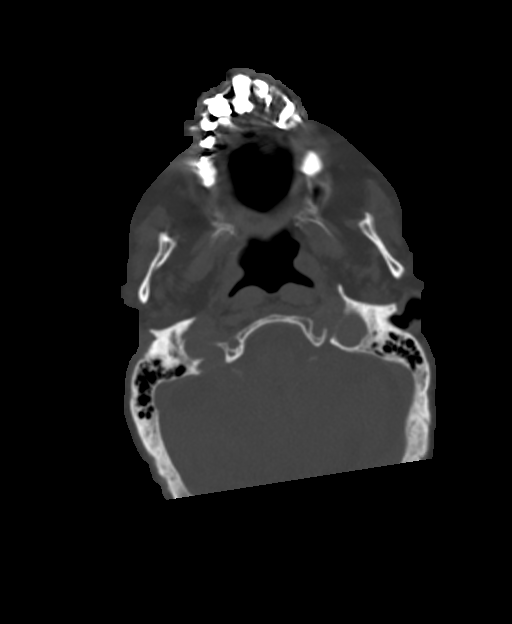

[12 of 33 positions shown; findings below may reference images not displayed]

FINDINGS: Pharynx and larynx: Streak artifact from dental restoration slightly
limits evaluation. Within described limitations, no mass or swelling
is identified.

Salivary glands: 1.8 x 1.9 x 1.7 cm ovoid mass within the right
parotid gland with an overlying skin marker. The left parotid and
bilateral submandibular glands are atrophic but otherwise
unremarkable.

Thyroid: Unremarkable

Lymph nodes: No pathologically enlarged cervical chain lymph nodes.

Vascular: Prominent atherosclerotic calcification of the imaged
aortic arch and major branch vessels. This includes prominent
atherosclerotic calcification within the bilateral common carotid
arteries and at the carotid bifurcations. Partial retroesophageal
course of the right common carotid artery. A descending thoracic
aortic aneurysm was demonstrated on prior CT abdomen/pelvis
10/29/2017, but is not well imaged on the current study.

Limited intracranial: Unremarkable

Visualized orbits: Bilateral lens replacements. Otherwise
unremarkable.

Mastoids and visualized paranasal sinuses: Mild scattered
opacification of bilateral ethmoid air cells. Tiny right maxillary
sinus mucous retention cyst. No significant mastoid effusion.

Skeleton: Prominent C5-C6 spondylosis with severe disc height loss
and posterior disc osteophyte complex. At least mild/moderate spinal
canal narrowing at this level.

Upper chest: There is a large mass centered in the region of the
medial left lung apex and upper left hila. The mass is incompletely
imaged but measures at least 9.4 x 5.2 x 7.8 cm (AP x TV x CC).
There is adjacent adenopathy versus extension of the mass within the
left mediastinum. The mass abuts the aortic arch and proximal left
subclavian artery. There is rightward displacement of the aortic
arch and lower thoracic trachea. Subtle erosion of the undersurface
of the left first rib is questioned. Lower right paratracheal
lymphadenopathy measuring 2.3 x 1.6 cm. Redemonstrated prominent
predominantly paraseptal emphysema. Dependent atelectasis within the
left lung.

These results were called by telephone at the time of interpretation
on 08/10/2018 at [DATE] to Dr. FAUCHER GO , who verbally
acknowledged these results.
IMPRESSION: Incompletely imaged large heterogeneous mass centered within the
left lung apex and upper left hilar region, measuring at least
cm. Findings are detailed within the body of the report and highly
suspicious for primary lung malignancy. There is extension of the
mass versus adjacent bulky lymphadenopathy within the left
mediastinum. Right paratracheal mediastinal lymphadenopathy is also
present. Subtle erosion of the undersurface of the left first rib is
questioned. Contrast-enhanced chest CT is recommended for complete
characterization

1.9 cm right parotid gland mass at site of reported palpable
finding. This is favored to reflect a primary parotid neoplasm.
Abscess is considered less likely as there is no significant
periparotid stranding. A metastatic lymph node is also a possibility
but considered less likely. Consider direct tissue sampling as
clinically warranted. No cervical lymphadenopathy elsewhere within
the neck.

A previously demonstrated descending thoracic aortic aneurysm is not
well imaged on the current exam. Please refer to prior CT
abdomen/pelvis 10/29/2017 for description.
